# Patient Record
Sex: Female | Born: 1950
Health system: Southern US, Community
[De-identification: ages and names within clinical notes are randomized; demographics above are authoritative.]

## PROBLEM LIST (undated history)

## (undated) DIAGNOSIS — F329 Major depressive disorder, single episode, unspecified: Secondary | ICD-10-CM

## (undated) DIAGNOSIS — E119 Type 2 diabetes mellitus without complications: Secondary | ICD-10-CM

## (undated) DIAGNOSIS — I5041 Acute combined systolic (congestive) and diastolic (congestive) heart failure: Secondary | ICD-10-CM

## (undated) DIAGNOSIS — F32A Depression, unspecified: Secondary | ICD-10-CM

## (undated) DIAGNOSIS — J45909 Unspecified asthma, uncomplicated: Secondary | ICD-10-CM

## (undated) DIAGNOSIS — I10 Essential (primary) hypertension: Secondary | ICD-10-CM

## (undated) DIAGNOSIS — N289 Disorder of kidney and ureter, unspecified: Secondary | ICD-10-CM

## (undated) DIAGNOSIS — K219 Gastro-esophageal reflux disease without esophagitis: Secondary | ICD-10-CM

## (undated) DIAGNOSIS — M1990 Unspecified osteoarthritis, unspecified site: Secondary | ICD-10-CM

## (undated) HISTORY — PX: CHOLECYSTECTOMY: SHX55

## (undated) HISTORY — PX: COLONOSCOPY: SHX174

## (undated) HISTORY — PX: OTHER SURGICAL HISTORY: SHX169

## (undated) HISTORY — PX: EYE SURGERY: SHX253

---

## 2017-07-26 ENCOUNTER — Emergency Department (HOSPITAL_COMMUNITY)
Admission: EM | Admit: 2017-07-26 | Discharge: 2017-07-26 | Disposition: A | Discharge status: Home / Self Care | Payer: Medicare HMO | Attending: Emergency Medicine | Admitting: Emergency Medicine

## 2017-07-26 ENCOUNTER — Encounter (HOSPITAL_COMMUNITY): Payer: Self-pay | Admitting: Emergency Medicine

## 2017-07-26 DIAGNOSIS — N289 Disorder of kidney and ureter, unspecified: Secondary | ICD-10-CM | POA: Diagnosis not present

## 2017-07-26 DIAGNOSIS — R197 Diarrhea, unspecified: Secondary | ICD-10-CM | POA: Insufficient documentation

## 2017-07-26 HISTORY — DX: Type 2 diabetes mellitus without complications: E11.9

## 2017-07-26 HISTORY — DX: Disorder of kidney and ureter, unspecified: N28.9

## 2017-07-26 LAB — LIPASE, BLOOD: LIPASE: 21 U/L (ref 11–51)

## 2017-07-26 LAB — CBC
HEMATOCRIT: 34.1 % — AB (ref 36.0–46.0)
HEMOGLOBIN: 11 g/dL — AB (ref 12.0–15.0)
MCH: 27.7 pg (ref 26.0–34.0)
MCHC: 32.3 g/dL (ref 30.0–36.0)
MCV: 85.9 fL (ref 78.0–100.0)
PLATELETS: 297 10*3/uL (ref 150–400)
RBC: 3.97 MIL/uL (ref 3.87–5.11)
RDW: 15.5 % (ref 11.5–15.5)
WBC: 10 10*3/uL (ref 4.0–10.5)

## 2017-07-26 LAB — COMPREHENSIVE METABOLIC PANEL
ALBUMIN: 3.8 g/dL (ref 3.5–5.0)
ALT: 12 U/L — ABNORMAL LOW (ref 14–54)
ANION GAP: 11 (ref 5–15)
AST: 18 U/L (ref 15–41)
Alkaline Phosphatase: 92 U/L (ref 38–126)
BILIRUBIN TOTAL: 0.6 mg/dL (ref 0.3–1.2)
BUN: 40 mg/dL — ABNORMAL HIGH (ref 6–20)
CO2: 21 mmol/L — AB (ref 22–32)
Calcium: 8.9 mg/dL (ref 8.9–10.3)
Chloride: 109 mmol/L (ref 101–111)
Creatinine, Ser: 3.99 mg/dL — ABNORMAL HIGH (ref 0.44–1.00)
GFR calc Af Amer: 12 mL/min — ABNORMAL LOW (ref >60)
GFR calc non Af Amer: 11 mL/min — ABNORMAL LOW (ref >60)
GLUCOSE: 210 mg/dL — AB (ref 65–99)
POTASSIUM: 3.4 mmol/L — AB (ref 3.5–5.1)
SODIUM: 141 mmol/L (ref 135–145)
TOTAL PROTEIN: 7.8 g/dL (ref 6.5–8.1)

## 2017-07-26 LAB — URINALYSIS, ROUTINE W REFLEX MICROSCOPIC
Bilirubin Urine: NEGATIVE
Glucose, UA: NEGATIVE mg/dL
Hgb urine dipstick: NEGATIVE
KETONES UR: NEGATIVE mg/dL
Nitrite: NEGATIVE
PROTEIN: 100 mg/dL — AB
Specific Gravity, Urine: 1.015 (ref 1.005–1.030)
pH: 6 (ref 5.0–8.0)

## 2017-07-26 MED ORDER — SODIUM CHLORIDE 0.9 % IV BOLUS (SEPSIS)
1000.0000 mL | Freq: Once | INTRAVENOUS | Status: AC
Start: 2017-07-26 — End: 2017-07-26
  Administered 2017-07-26: 1000 mL via INTRAVENOUS

## 2017-07-26 NOTE — Discharge Instructions (Signed)
As discussed, please be sure to use or provided resources to follow-up with both your primary care physician and a nephrologist. Please drink plenty of fluids, get plenty of rest, and return here for concerning changes in your condition.

## 2017-07-26 NOTE — ED Provider Notes (Signed)
[facility] DEPT Provider Note   CSN: [id number] Arrival date & time: 07/26/17  0820     History   Chief Complaint Chief Complaint  Patient presents with  . Diarrhea    HPI Priscilla Davis is a 66 y.o. female.  HPI Patient presents with concern of ongoing diarrhea. Onset began about 4 days ago, since onset symptoms have been persistent, with between 5 and 10 loose bowel movements daily. There has been abdominal pain, but there is none currently. There is associated anorexia, nausea, but no vomiting. With no fever, no chills. No clear precipitant. Since onset no relief with Imodium. No other complaints including no chest pain, headache, syncope. Patient acknowledges a history of insulin-dependent diabetes, chronic kidney disease. Past Medical History:  Diagnosis Date  . Diabetes mellitus without complication ([place])   . Renal disorder     There are no active problems to display for this patient.   Past Surgical History:  Procedure Laterality Date  . CESAREAN SECTION    . CHOLECYSTECTOMY    . toe amputation      OB History    No data available       Home Medications    Prior to Admission medications   Medication Sig Start Date End Date Taking? Authorizing Provider  albuterol (PROVENTIL HFA;VENTOLIN HFA) 108 (90 Base) MCG/ACT inhaler Inhale 2 puffs into the lungs every 6 (six) hours as needed for wheezing or shortness of breath.   Yes [provider]  amLODipine (NORVASC) 10 MG tablet Take 10 mg by mouth daily.   Yes [provider]  aspirin EC 81 MG tablet Take 81 mg by mouth daily.   Yes [provider]  cholecalciferol (VITAMIN D) 1000 units tablet Take 1,000 Units by mouth daily.   Yes [provider]  dorzolamide (TRUSOPT) 2 % ophthalmic solution Place 1 drop into both eyes 2 (two) times daily.   Yes [provider]  FLUoxetine (PROZAC) 20 MG capsule Take 20 mg by mouth daily. 05/22/17   Yes [provider]  gabapentin (NEURONTIN) 300 MG capsule Take 300 mg by mouth 3 (three) times daily.   Yes [provider]  insulin detemir (LEVEMIR) 100 UNIT/ML injection Inject 52 Units into the skin 2 (two) times daily.   Yes [provider]  latanoprost (XALATAN) 0.005 % ophthalmic solution Place 1 drop into both eyes at bedtime.   Yes [provider]  liraglutide (VICTOZA) 18 MG/3ML SOPN Inject 2.8 mg into the skin every evening.   Yes [provider]  loperamide (IMODIUM A-D) 2 MG tablet Take 2-4 mg by mouth as needed for diarrhea or loose stools.   Yes [provider]  losartan (COZAAR) 25 MG tablet Take 25 mg by mouth daily.   Yes [provider]  metoprolol tartrate (LOPRESSOR) 25 MG tablet Take 25 mg by mouth 2 (two) times daily.   Yes [provider]  omeprazole (PRILOSEC) 20 MG capsule Take 20 mg by mouth daily.   Yes [provider]  rosuvastatin (CRESTOR) 20 MG tablet Take 20 mg by mouth at bedtime.   Yes [provider]  vitamin B-12 (CYANOCOBALAMIN) 1000 MCG tablet Take 1,000 mcg by mouth daily.   Yes [provider]    Family History No family history on file.  Social History Social History   Tobacco Use  . Smoking status: Current Every Day Smoker    Types: Cigarettes  . Smokeless tobacco: Never Used  Substance  Use Topics  . Alcohol use: Not on file  . Drug use: Not on file     Allergies   Patient has no known allergies.   Review of Systems Review of Systems  Constitutional:       Per HPI, otherwise negative  HENT:       Per HPI, otherwise negative  Respiratory:       Per HPI, otherwise negative  Cardiovascular:       Per HPI, otherwise negative  Gastrointestinal: Positive for diarrhea and nausea. Negative for abdominal pain and vomiting.  Endocrine:       Negative aside from HPI  Genitourinary:       Neg aside from HPI   Musculoskeletal:       Per  HPI, otherwise negative  Skin: Negative.   Neurological: Positive for weakness. Negative for syncope.     Physical Exam Updated Vital Signs BP (!) 161/79   Pulse 69   Temp 98.2 F (36.8 C) (Oral)   Resp 15   Ht 5\' 8"  (1.727 m)   Wt 118.8 kg (262 lb)   SpO2 99%   BMI 39.84 kg/m   Physical Exam  Constitutional: She is oriented to person, place, and time. She appears well-developed and well-nourished. No distress.  HENT:  Head: Normocephalic and atraumatic.  Eyes: Conjunctivae and EOM are normal.  Cardiovascular: Normal rate and regular rhythm.  Pulmonary/Chest: Effort normal and breath sounds normal. No stridor. No respiratory distress.  Abdominal: She exhibits no distension and no mass. There is no tenderness. There is no guarding.  Musculoskeletal: She exhibits no edema.  Neurological: She is alert and oriented to person, place, and time. No cranial nerve deficit.  Skin: Skin is warm and dry.  Psychiatric: She has a normal mood and affect.  Nursing note and vitals reviewed.    ED Treatments / Results  Labs (all labs ordered are listed, but only abnormal results are displayed) Labs Reviewed  COMPREHENSIVE METABOLIC PANEL - Abnormal; Notable for the following components:      Result Value   Potassium 3.4 (*)    CO2 21 (*)    Glucose, Bld 210 (*)    BUN 40 (*)    Creatinine, Ser 3.99 (*)    ALT 12 (*)    GFR calc non Af Amer 11 (*)    GFR calc Af Amer 12 (*)    All other components within normal limits  CBC - Abnormal; Notable for the following components:   Hemoglobin 11.0 (*)    HCT 34.1 (*)    All other components within normal limits  URINALYSIS, ROUTINE W REFLEX MICROSCOPIC - Abnormal; Notable for the following components:   APPearance HAZY (*)    Protein, ur 100 (*)    Leukocytes, UA TRACE (*)    Bacteria, UA MANY (*)    Squamous Epithelial / LPF 6-30 (*)    All other components within normal limits  LIPASE, BLOOD    Procedures Procedures (including  critical care time)  Medications Ordered in ED Medications  sodium chloride 0.9 % bolus 1,000 mL (0 mLs Intravenous Stopped 07/26/17 1049)     Initial Impression / Assessment and Plan / ED Course  I have reviewed the triage vital signs and the nursing notes.  Pertinent labs & imaging results that were available during my care of the patient were reviewed by me and considered in my medical decision making (see chart for details).  1:34 PM Patient awake alert, in  no distress, no additional episodes of diarrhea. I have discussed the findings with the patient and her sister at length. They note that she recently moved here from [place] has no primary care physician, nor a nephrologist. They also deny any knowledge of her chronic kidney disease status.  Patient remains him dynamically stable, awake, alert With a soft, nontender, non-peritoneal abdomen, no fever, no hemodynamic instability, imaging not indicated. Suspicion for dehydration contributing to her illness, likely exacerbating her chronic kidney disease. With generally reassuring findings, no initial loose bowel movement, the patient will follow up with nephrology and primary care. I discussed her case with our case management team to facilitate outpatient follow-up.  Final Clinical Impressions(s) / ED Diagnoses   Final diagnoses:  Diarrhea, unspecified type  Renal dysfunction     Carmin Muskrat, MD 07/26/17 1335

## 2017-07-26 NOTE — ED Triage Notes (Signed)
Patient reports that since Saturday she has had diarrhea, nausea and loss of appetite. Denies any vomiting.

## 2017-07-26 NOTE — ED Notes (Signed)
ED Provider at bedside. 

## 2017-07-26 NOTE — ED Notes (Signed)
Patient denies pain and is resting comfortably.  

## 2017-07-26 NOTE — ED Notes (Signed)
Pt unable to provide urine sample at this time 

## 2017-07-26 NOTE — Care Management Note (Signed)
Case Management Note  CM consulted for recent move to area with no pcp or nephrologist.  [facility] and [facility] information on AVS.  CM is unable to schedule appointments for pt due to offices closed for lunch.  Spoke with pt and advised her and her adult daughter of information.  Additionally advised her to look on the back of her insurance card for a phone number for assistance with a PCP, and also that there would a be a provider search number on the AVS.  Pt acknowledged understanding and had no further questions on needs.  No further CM needs noted at this time.

## 2018-02-07 ENCOUNTER — Other Ambulatory Visit: Payer: Self-pay | Admitting: Family Medicine

## 2018-02-07 DIAGNOSIS — Z1231 Encounter for screening mammogram for malignant neoplasm of breast: Secondary | ICD-10-CM

## 2018-02-09 ENCOUNTER — Other Ambulatory Visit: Payer: Self-pay | Admitting: Family Medicine

## 2018-02-09 DIAGNOSIS — R5381 Other malaise: Secondary | ICD-10-CM

## 2018-02-15 ENCOUNTER — Other Ambulatory Visit: Payer: Self-pay | Admitting: Family Medicine

## 2018-02-15 DIAGNOSIS — E2839 Other primary ovarian failure: Secondary | ICD-10-CM

## 2018-03-05 ENCOUNTER — Ambulatory Visit
Admission: RE | Admit: 2018-03-05 | Discharge: 2018-03-05 | Disposition: A | Discharge status: Home / Self Care | Payer: Medicare HMO | Source: Ambulatory Visit | Attending: Family Medicine | Admitting: Family Medicine

## 2018-03-05 DIAGNOSIS — Z1231 Encounter for screening mammogram for malignant neoplasm of breast: Secondary | ICD-10-CM

## 2018-03-30 ENCOUNTER — Ambulatory Visit
Admission: RE | Admit: 2018-03-30 | Discharge: 2018-03-30 | Disposition: A | Discharge status: Home / Self Care | Payer: Medicare HMO | Source: Ambulatory Visit | Attending: Family Medicine | Admitting: Family Medicine

## 2018-03-30 DIAGNOSIS — E2839 Other primary ovarian failure: Secondary | ICD-10-CM

## 2018-05-07 ENCOUNTER — Other Ambulatory Visit: Payer: Self-pay

## 2018-05-07 DIAGNOSIS — N185 Chronic kidney disease, stage 5: Secondary | ICD-10-CM

## 2018-05-31 ENCOUNTER — Encounter: Payer: Self-pay | Admitting: Vascular Surgery

## 2018-05-31 ENCOUNTER — Telehealth: Payer: Self-pay | Admitting: *Deleted

## 2018-05-31 ENCOUNTER — Ambulatory Visit (INDEPENDENT_AMBULATORY_CARE_PROVIDER_SITE_OTHER): Payer: Medicare HMO | Admitting: Vascular Surgery

## 2018-05-31 ENCOUNTER — Ambulatory Visit (HOSPITAL_COMMUNITY)
Admission: RE | Admit: 2018-05-31 | Discharge: 2018-05-31 | Disposition: A | Discharge status: Home / Self Care | Payer: Medicare HMO | Source: Ambulatory Visit | Attending: Vascular Surgery | Admitting: Vascular Surgery

## 2018-05-31 ENCOUNTER — Other Ambulatory Visit: Payer: Self-pay

## 2018-05-31 ENCOUNTER — Ambulatory Visit (INDEPENDENT_AMBULATORY_CARE_PROVIDER_SITE_OTHER)
Admission: RE | Admit: 2018-05-31 | Discharge: 2018-05-31 | Disposition: A | Discharge status: Home / Self Care | Payer: Medicare HMO | Source: Ambulatory Visit | Attending: Vascular Surgery | Admitting: Vascular Surgery

## 2018-05-31 VITALS — BP 144/77 | HR 68 | Temp 97.8°F | Resp 16 | Ht 68.0 in | Wt 225.0 lb

## 2018-05-31 DIAGNOSIS — Z01810 Encounter for preprocedural cardiovascular examination: Secondary | ICD-10-CM | POA: Diagnosis present

## 2018-05-31 DIAGNOSIS — N185 Chronic kidney disease, stage 5: Secondary | ICD-10-CM

## 2018-05-31 NOTE — H&P (View-Only) (Signed)
Referring Physician: Dr Johnney Ou Patient name: Priscilla Davis MRN: [id number] DOB: [birth year] Sex: female  REASON FOR CONSULT: Hemodialysis access  HPI: Priscilla Davis is a 67 y.o. female who is not currently on hemodialysis.  Notes from her referring physician suggested she is CKD 5.  She denies shortness of breath.  She denies chest pain.  She is right-handed.  She has not had any prior access procedures.  Other medical problems include diabetes hypertension and elevated cholesterol.  These are all currently stable.  She is on aspirin and a statin.  Past Medical History:  Diagnosis Date  . Diabetes mellitus without complication ([place])   . Renal disorder    Past Surgical History:  Procedure Laterality Date  . CESAREAN SECTION    . CHOLECYSTECTOMY    . toe amputation      Family History  Problem Relation Age of Onset  . Peripheral vascular disease Mother   . Hypertension Mother     SOCIAL HISTORY: Social History   Socioeconomic History  . Marital status: Widowed    Spouse name: Not on file  . Number of children: Not on file  . Years of education: Not on file  . Highest education level: Not on file  Occupational History  . Not on file  Social Needs  . Financial resource strain: Not on file  . Food insecurity:    Worry: Not on file    Inability: Not on file  . Transportation needs:    Medical: Not on file    Non-medical: Not on file  Tobacco Use  . Smoking status: Current Every Day Smoker    Types: Cigarettes  . Smokeless tobacco: Never Used  Substance and Sexual Activity  . Alcohol use: Yes    Comment: seldom  . Drug use: Never  . Sexual activity: Not on file  Lifestyle  . Physical activity:    Days per week: Not on file    Minutes per session: Not on file  . Stress: Not on file  Relationships  . Social connections:    Talks on phone: Not on file    Gets together: Not on file    Attends religious service: Not on file    Active member of club or organization:  Not on file    Attends meetings of clubs or organizations: Not on file    Relationship status: Not on file  . Intimate partner violence:    Fear of current or ex partner: Not on file    Emotionally abused: Not on file    Physically abused: Not on file    Forced sexual activity: Not on file  Other Topics Concern  . Not on file  Social History Narrative  . Not on file    No Known Allergies  Current Outpatient Medications  Medication Sig Dispense Refill  . albuterol (PROVENTIL HFA;VENTOLIN HFA) 108 (90 Base) MCG/ACT inhaler Inhale 2 puffs into the lungs every 6 (six) hours as needed for wheezing or shortness of breath.    Marland Kitchen amLODipine (NORVASC) 10 MG tablet Take 10 mg by mouth daily.    Marland Kitchen aspirin EC 81 MG tablet Take 81 mg by mouth daily.    . cholecalciferol (VITAMIN D) 1000 units tablet Take 1,000 Units by mouth daily.    . dorzolamide (TRUSOPT) 2 % ophthalmic solution Place 1 drop into both eyes 2 (two) times daily.    Marland Kitchen FLUoxetine (PROZAC) 20 MG capsule Take 20 mg by mouth daily.    Marland Kitchen  gabapentin (NEURONTIN) 300 MG capsule Take 300 mg by mouth 3 (three) times daily.    . insulin detemir (LEVEMIR) 100 UNIT/ML injection Inject 52 Units into the skin 2 (two) times daily.    Marland Kitchen latanoprost (XALATAN) 0.005 % ophthalmic solution Place 1 drop into both eyes at bedtime.    . liraglutide (VICTOZA) 18 MG/3ML SOPN Inject 2.8 mg into the skin every evening.    . loperamide (IMODIUM A-D) 2 MG tablet Take 2-4 mg by mouth as needed for diarrhea or loose stools.    Marland Kitchen losartan (COZAAR) 25 MG tablet Take 25 mg by mouth daily.    . metoprolol tartrate (LOPRESSOR) 25 MG tablet Take 25 mg by mouth 2 (two) times daily.    Marland Kitchen omeprazole (PRILOSEC) 20 MG capsule Take 20 mg by mouth daily.    . rosuvastatin (CRESTOR) 20 MG tablet Take 20 mg by mouth at bedtime.    . vitamin B-12 (CYANOCOBALAMIN) 1000 MCG tablet Take 1,000 mcg by mouth daily.     No current facility-administered medications for this visit.       ROS:   General:  No weight loss, Fever, chills  HEENT: No recent headaches, no nasal bleeding, no visual changes, no sore throat  Neurologic: No dizziness, blackouts, seizures. No recent symptoms of stroke or mini- stroke. No recent episodes of slurred speech, or temporary blindness.  Cardiac: No recent episodes of chest pain/pressure, no shortness of breath at rest.  + shortness of breath with exertion.  Denies history of atrial fibrillation or irregular heartbeat  Vascular: No history of rest pain in feet.  No history of claudication.  No history of non-healing ulcer, No history of DVT   Pulmonary: No home oxygen, no productive cough, no hemoptysis,  No asthma or wheezing  Musculoskeletal:  [ ]  Arthritis, [ ]  Low back pain,  [ ]  Joint pain  Hematologic:No history of hypercoagulable state.  No history of easy bleeding.  No history of anemia  Gastrointestinal: No hematochezia or melena,  No gastroesophageal reflux, no trouble swallowing  Urinary: [X]  chronic Kidney disease, [ ]  on HD - [ ]  MWF or [ ]  TTHS, [ ]  Burning with urination, [ ]  Frequent urination, [ ]  Difficulty urinating;   Skin: No rashes  Psychological: No history of anxiety,  No history of depression   Physical Examination  Vitals:   05/31/18 0913 05/31/18 0919  BP: (!) 166/82 (!) 144/77  Pulse: 68 68  Resp: 16   Temp: 97.8 F (36.6 C)   TempSrc: Oral   SpO2: 100%   Weight: 225 lb (102.1 kg)   Height: 5\' 8"  (1.727 m)     Body mass index is 34.21 kg/m.  General:  Alert and oriented, no acute distress HEENT: Normal Neck: No bruit or JVD Pulmonary: Clear to auscultation bilaterally Cardiac: Regular Rate and Rhythm without murmur Skin: No rash Extremity Pulses:  2+ radial, brachial pulses bilaterally Musculoskeletal: No deformity or edema  Neurologic: Upper and lower extremity motor 5/5 and symmetric  DATA:  She had an upper extremity vein mapping and upper extremity arterial duplex today.  This  showed small veins in both upper extremities less than 2 mm diameter she also had a very small radial artery less than 2 mm diameter.  He had a normal diameter brachial artery with normal anatomic configuration at the antecubital area  ASSESSMENT: Patient's first access option would be a left upper arm AV graft.  He is not a candidate for a fistula  due to very small vein diameter.   PLAN: Procedure details risk benefits possible complications discussed with patient today related to left upper arm AV graft.  These include but are not limited to bleeding infection ischemic steal graft thrombosis need for other procedures.  She wishes to proceed.  She will call us in the near future to schedule when she arranges transportation.  I will touch base with Dr. Johnney Ou to see whether or not she wishes for [place] to go ahead and place an AV graft now or wait until she is closer to time of needing hemodialysis so that we are not maintaining a graft that is not in use.  Ruta Hinds, MD [facility] Office: [phone] Pager: [phone]

## 2018-05-31 NOTE — Progress Notes (Signed)
Vitals:   05/31/18 0913  BP: (!) 166/82  Pulse: 68  Resp: 16  Temp: 97.8 F (36.6 C)  TempSrc: Oral  SpO2: 100%  Weight: 225 lb (102.1 kg)  Height: 5\' 8"  (1.727 m)

## 2018-05-31 NOTE — Telephone Encounter (Signed)
Message left for [facility] at [facility] (see Dr. Oneida Alar notes)

## 2018-05-31 NOTE — Progress Notes (Signed)
Referring Physician: Dr Johnney Ou Patient name: Priscilla Davis MRN: [id number] DOB: 1950/12/21 Sex: female  REASON FOR CONSULT: Hemodialysis access  HPI: Priscilla Davis is a 67 y.o. female who is not currently on hemodialysis.  Notes from her referring physician suggested she is CKD 5.  She denies shortness of breath.  She denies chest pain.  She is right-handed.  She has not had any prior access procedures.  Other medical problems include diabetes hypertension and elevated cholesterol.  These are all currently stable.  She is on aspirin and a statin.  Past Medical History:  Diagnosis Date  . Diabetes mellitus without complication ([place])   . Renal disorder    Past Surgical History:  Procedure Laterality Date  . CESAREAN SECTION    . CHOLECYSTECTOMY    . toe amputation      Family History  Problem Relation Age of Onset  . Peripheral vascular disease Mother   . Hypertension Mother     SOCIAL HISTORY: Social History   Socioeconomic History  . Marital status: Widowed    Spouse name: Not on file  . Number of children: Not on file  . Years of education: Not on file  . Highest education level: Not on file  Occupational History  . Not on file  Social Needs  . Financial resource strain: Not on file  . Food insecurity:    Worry: Not on file    Inability: Not on file  . Transportation needs:    Medical: Not on file    Non-medical: Not on file  Tobacco Use  . Smoking status: Current Every Day Smoker    Types: Cigarettes  . Smokeless tobacco: Never Used  Substance and Sexual Activity  . Alcohol use: Yes    Comment: seldom  . Drug use: Never  . Sexual activity: Not on file  Lifestyle  . Physical activity:    Days per week: Not on file    Minutes per session: Not on file  . Stress: Not on file  Relationships  . Social connections:    Talks on phone: Not on file    Gets together: Not on file    Attends religious service: Not on file    Active member of club or organization:  Not on file    Attends meetings of clubs or organizations: Not on file    Relationship status: Not on file  . Intimate partner violence:    Fear of current or ex partner: Not on file    Emotionally abused: Not on file    Physically abused: Not on file    Forced sexual activity: Not on file  Other Topics Concern  . Not on file  Social History Narrative  . Not on file    No Known Allergies  Current Outpatient Medications  Medication Sig Dispense Refill  . albuterol (PROVENTIL HFA;VENTOLIN HFA) 108 (90 Base) MCG/ACT inhaler Inhale 2 puffs into the lungs every 6 (six) hours as needed for wheezing or shortness of breath.    Marland Kitchen amLODipine (NORVASC) 10 MG tablet Take 10 mg by mouth daily.    Marland Kitchen aspirin EC 81 MG tablet Take 81 mg by mouth daily.    . cholecalciferol (VITAMIN D) 1000 units tablet Take 1,000 Units by mouth daily.    . dorzolamide (TRUSOPT) 2 % ophthalmic solution Place 1 drop into both eyes 2 (two) times daily.    Marland Kitchen FLUoxetine (PROZAC) 20 MG capsule Take 20 mg by mouth daily.    Marland Kitchen  gabapentin (NEURONTIN) 300 MG capsule Take 300 mg by mouth 3 (three) times daily.    . insulin detemir (LEVEMIR) 100 UNIT/ML injection Inject 52 Units into the skin 2 (two) times daily.    Marland Kitchen latanoprost (XALATAN) 0.005 % ophthalmic solution Place 1 drop into both eyes at bedtime.    . liraglutide (VICTOZA) 18 MG/3ML SOPN Inject 2.8 mg into the skin every evening.    . loperamide (IMODIUM A-D) 2 MG tablet Take 2-4 mg by mouth as needed for diarrhea or loose stools.    Marland Kitchen losartan (COZAAR) 25 MG tablet Take 25 mg by mouth daily.    . metoprolol tartrate (LOPRESSOR) 25 MG tablet Take 25 mg by mouth 2 (two) times daily.    Marland Kitchen omeprazole (PRILOSEC) 20 MG capsule Take 20 mg by mouth daily.    . rosuvastatin (CRESTOR) 20 MG tablet Take 20 mg by mouth at bedtime.    . vitamin B-12 (CYANOCOBALAMIN) 1000 MCG tablet Take 1,000 mcg by mouth daily.     No current facility-administered medications for this visit.       ROS:   General:  No weight loss, Fever, chills  HEENT: No recent headaches, no nasal bleeding, no visual changes, no sore throat  Neurologic: No dizziness, blackouts, seizures. No recent symptoms of stroke or mini- stroke. No recent episodes of slurred speech, or temporary blindness.  Cardiac: No recent episodes of chest pain/pressure, no shortness of breath at rest.  + shortness of breath with exertion.  Denies history of atrial fibrillation or irregular heartbeat  Vascular: No history of rest pain in feet.  No history of claudication.  No history of non-healing ulcer, No history of DVT   Pulmonary: No home oxygen, no productive cough, no hemoptysis,  No asthma or wheezing  Musculoskeletal:  [ ]  Arthritis, [ ]  Low back pain,  [ ]  Joint pain  Hematologic:No history of hypercoagulable state.  No history of easy bleeding.  No history of anemia  Gastrointestinal: No hematochezia or melena,  No gastroesophageal reflux, no trouble swallowing  Urinary: [X]  chronic Kidney disease, [ ]  on HD - [ ]  MWF or [ ]  TTHS, [ ]  Burning with urination, [ ]  Frequent urination, [ ]  Difficulty urinating;   Skin: No rashes  Psychological: No history of anxiety,  No history of depression   Physical Examination  Vitals:   05/31/18 0913 05/31/18 0919  BP: (!) 166/82 (!) 144/77  Pulse: 68 68  Resp: 16   Temp: 97.8 F (36.6 C)   TempSrc: Oral   SpO2: 100%   Weight: 225 lb (102.1 kg)   Height: 5\' 8"  (1.727 m)     Body mass index is 34.21 kg/m.  General:  Alert and oriented, no acute distress HEENT: Normal Neck: No bruit or JVD Pulmonary: Clear to auscultation bilaterally Cardiac: Regular Rate and Rhythm without murmur Skin: No rash Extremity Pulses:  2+ radial, brachial pulses bilaterally Musculoskeletal: No deformity or edema  Neurologic: Upper and lower extremity motor 5/5 and symmetric  DATA:  She had an upper extremity vein mapping and upper extremity arterial duplex today.  This  showed small veins in both upper extremities less than 2 mm diameter she also had a very small radial artery less than 2 mm diameter.  He had a normal diameter brachial artery with normal anatomic configuration at the antecubital area  ASSESSMENT: Patient's first access option would be a left upper arm AV graft.  He is not a candidate for a fistula  due to very small vein diameter.   PLAN: Procedure details risk benefits possible complications discussed with patient today related to left upper arm AV graft.  These include but are not limited to bleeding infection ischemic steal graft thrombosis need for other procedures.  She wishes to proceed.  She will call us in the near future to schedule when she arranges transportation.  I will touch base with Dr. Johnney Ou to see whether or not she wishes for [place] to go ahead and place an AV graft now or wait until she is closer to time of needing hemodialysis so that we are not maintaining a graft that is not in use.  Ruta Hinds, MD [facility] Office: [phone] Pager: [phone]

## 2018-06-04 ENCOUNTER — Encounter: Payer: Self-pay | Admitting: Internal Medicine

## 2018-06-05 ENCOUNTER — Telehealth: Payer: Self-pay | Admitting: *Deleted

## 2018-06-05 ENCOUNTER — Other Ambulatory Visit: Payer: Self-pay | Admitting: *Deleted

## 2018-06-05 NOTE — Progress Notes (Signed)
LEFT MESSAGE FOR PATIENT TO CALL BACK FOR PRE-OP INSTRUCTIONS. SURGERY SCHEDULED FOR 06/18/18.

## 2018-06-05 NOTE — Telephone Encounter (Signed)
Instructed to be at [facility] admitting department at 8:15 am on 06/18/18 for surgery. NPO past MN night prior and must have a driver and caregiver for discharge to home. Expect a call and follow the detailed instructions received from the hospital pre-admission department for insulin coverage, medications and instructions for this surgery. Verbalized understanding and to call this office for questions.

## 2018-06-07 ENCOUNTER — Telehealth: Payer: Self-pay | Admitting: *Deleted

## 2018-06-07 NOTE — Telephone Encounter (Signed)
Patient called and requested date change for surgery to 06/19/18 Instructed to be at hospital at 7:30 am all other pre-op instructions unchanged.

## 2018-06-10 ENCOUNTER — Emergency Department (HOSPITAL_COMMUNITY)
Admission: EM | Admit: 2018-06-10 | Discharge: 2018-06-10 | Disposition: A | Discharge status: Home / Self Care | Payer: Medicare HMO | Attending: Emergency Medicine | Admitting: Emergency Medicine

## 2018-06-10 ENCOUNTER — Other Ambulatory Visit: Payer: Self-pay

## 2018-06-10 ENCOUNTER — Emergency Department (HOSPITAL_COMMUNITY): Payer: Medicare HMO

## 2018-06-10 ENCOUNTER — Encounter (HOSPITAL_COMMUNITY): Payer: Self-pay | Admitting: *Deleted

## 2018-06-10 DIAGNOSIS — R634 Abnormal weight loss: Secondary | ICD-10-CM | POA: Insufficient documentation

## 2018-06-10 DIAGNOSIS — R1310 Dysphagia, unspecified: Secondary | ICD-10-CM | POA: Diagnosis not present

## 2018-06-10 DIAGNOSIS — R197 Diarrhea, unspecified: Secondary | ICD-10-CM | POA: Insufficient documentation

## 2018-06-10 DIAGNOSIS — R1030 Lower abdominal pain, unspecified: Secondary | ICD-10-CM | POA: Diagnosis present

## 2018-06-10 DIAGNOSIS — Z794 Long term (current) use of insulin: Secondary | ICD-10-CM | POA: Diagnosis not present

## 2018-06-10 DIAGNOSIS — I10 Essential (primary) hypertension: Secondary | ICD-10-CM | POA: Insufficient documentation

## 2018-06-10 DIAGNOSIS — E119 Type 2 diabetes mellitus without complications: Secondary | ICD-10-CM | POA: Diagnosis not present

## 2018-06-10 HISTORY — DX: Essential (primary) hypertension: I10

## 2018-06-10 LAB — CBC
HEMATOCRIT: 33.9 % — AB (ref 36.0–46.0)
HEMOGLOBIN: 10.3 g/dL — AB (ref 12.0–15.0)
MCH: 27.8 pg (ref 26.0–34.0)
MCHC: 30.4 g/dL (ref 30.0–36.0)
MCV: 91.4 fL (ref 80.0–100.0)
Platelets: 315 10*3/uL (ref 150–400)
RBC: 3.71 MIL/uL — ABNORMAL LOW (ref 3.87–5.11)
RDW: 14.7 % (ref 11.5–15.5)
WBC: 8.5 10*3/uL (ref 4.0–10.5)
nRBC: 0 % (ref 0.0–0.2)

## 2018-06-10 LAB — COMPREHENSIVE METABOLIC PANEL
ALBUMIN: 3.2 g/dL — AB (ref 3.5–5.0)
ALT: 12 U/L (ref 0–44)
AST: 17 U/L (ref 15–41)
Alkaline Phosphatase: 74 U/L (ref 38–126)
Anion gap: 10 (ref 5–15)
BUN: 28 mg/dL — AB (ref 8–23)
CHLORIDE: 112 mmol/L — AB (ref 98–111)
CO2: 19 mmol/L — ABNORMAL LOW (ref 22–32)
Calcium: 8.8 mg/dL — ABNORMAL LOW (ref 8.9–10.3)
Creatinine, Ser: 3.79 mg/dL — ABNORMAL HIGH (ref 0.44–1.00)
GFR calc Af Amer: 13 mL/min — ABNORMAL LOW (ref >60)
GFR calc non Af Amer: 11 mL/min — ABNORMAL LOW (ref >60)
GLUCOSE: 104 mg/dL — AB (ref 70–99)
POTASSIUM: 3.8 mmol/L (ref 3.5–5.1)
Sodium: 141 mmol/L (ref 135–145)
Total Bilirubin: 0.6 mg/dL (ref 0.3–1.2)
Total Protein: 6.9 g/dL (ref 6.5–8.1)

## 2018-06-10 LAB — LIPASE, BLOOD: LIPASE: 28 U/L (ref 11–51)

## 2018-06-10 MED ORDER — MORPHINE SULFATE (PF) 4 MG/ML IV SOLN
4.0000 mg | Freq: Once | INTRAVENOUS | Status: AC
  Administered 2018-06-10: 4 mg via INTRAVENOUS
  Filled 2018-06-10: qty 1

## 2018-06-10 MED ORDER — SODIUM CHLORIDE 0.9 % IV BOLUS
1000.0000 mL | Freq: Once | INTRAVENOUS | Status: AC
  Administered 2018-06-10: 1000 mL via INTRAVENOUS

## 2018-06-10 NOTE — ED Notes (Signed)
Patient transported to CT 

## 2018-06-10 NOTE — ED Triage Notes (Signed)
Pt reports episodes of lower abd pain with diarrhea x 2 months. Reports possible fever this am. Denies n/v but feels like her food gets stuck in her throat and does not digest. No acute distress is noted at this time.

## 2018-06-10 NOTE — ED Provider Notes (Signed)
[facility] EMERGENCY DEPARTMENT Provider Note   CSN: [id number] Arrival date & time: 06/10/18  1203     History   Chief Complaint Chief Complaint  Patient presents with  . Abdominal Pain  . Diarrhea    HPI Priscilla Davis is a 67 y.o. female.  HPI Patient is a 67 year old female presents the emergency department with approximately 2 months of diarrhea and some lower abdominal discomfort.  The diarrhea occurs once to twice a week.  Followed by loose stools.  She has not had a formed stool in some time.  Her last colonoscopy was greater than 10 years ago.  She also reports some 30 to 40 pound weight loss over the past several months.  She states her appetite is been normal.  She denies chest pain.  She denies focal abdominal pain at this time.  She also reports some feeling of dysphasia with certain foods.  She is able to drink water.  She has no other complaints at this time.  Symptoms are mild to moderate in severity.  She spoke with her primary care physician regarding the dysphagia one time was put on Zantac which she reports is not helping. Past Medical History:  Diagnosis Date  . Diabetes mellitus without complication ([place])   . Hypertension   . Renal disorder     There are no active problems to display for this patient.   Past Surgical History:  Procedure Laterality Date  . CESAREAN SECTION    . CHOLECYSTECTOMY    . toe amputation       OB History   None      Home Medications    Prior to Admission medications   Medication Sig Start Date End Date Taking? Authorizing Provider  albuterol (PROVENTIL HFA;VENTOLIN HFA) 108 (90 Base) MCG/ACT inhaler Inhale 2 puffs into the lungs every 6 (six) hours as needed for wheezing or shortness of breath.   Yes [provider]  amLODipine (NORVASC) 10 MG tablet Take 10 mg by mouth daily.   Yes [provider]  aspirin EC 81 MG tablet Take 81 mg by mouth daily.   Yes [provider]    cholecalciferol (VITAMIN D) 1000 units tablet Take 1,000 Units by mouth daily.   Yes [provider]  dorzolamide (TRUSOPT) 2 % ophthalmic solution Place 1 drop into both eyes 2 (two) times daily.   Yes [provider]  FLUoxetine (PROZAC) 20 MG capsule Take 20 mg by mouth daily. 05/22/17  Yes [provider]  gabapentin (NEURONTIN) 300 MG capsule Take 300 mg by mouth 3 (three) times daily.   Yes [provider]  insulin detemir (LEVEMIR) 100 UNIT/ML injection Inject 48 Units into the skin 2 (two) times daily.    Yes [provider]  latanoprost (XALATAN) 0.005 % ophthalmic solution Place 1 drop into both eyes at bedtime.   Yes [provider]  liraglutide (VICTOZA) 18 MG/3ML SOPN Inject 2.8 mg into the skin every evening.   Yes [provider]  losartan (COZAAR) 25 MG tablet Take 25 mg by mouth daily.   Yes [provider]  metoprolol tartrate (LOPRESSOR) 25 MG tablet Take 25 mg by mouth 2 (two) times daily.   Yes [provider]  omeprazole (PRILOSEC) 20 MG capsule Take 20 mg by mouth daily.   Yes [provider]  rosuvastatin (CRESTOR) 20 MG tablet Take 20 mg by mouth at bedtime.   Yes [provider]  vitamin B-12 (CYANOCOBALAMIN)  1000 MCG tablet Take 1,000 mcg by mouth daily.   Yes [provider]    Family History Family History  Problem Relation Age of Onset  . Peripheral vascular disease Mother   . Hypertension Mother     Social History Social History   Tobacco Use  . Smoking status: Current Every Day Smoker    Types: Cigarettes  . Smokeless tobacco: Never Used  Substance Use Topics  . Alcohol use: Yes    Comment: seldom  . Drug use: Never     Allergies   Patient has no known allergies.   Review of Systems Review of Systems  All other systems reviewed and are negative.    Physical Exam Updated Vital Signs BP (!) 166/78   Pulse 75   Temp 98.9 F (37.2  C) (Oral)   Resp 17   Ht 5\' 6"  (1.676 m)   Wt 102.1 kg   SpO2 100%   BMI 36.32 kg/m   Physical Exam  Constitutional: She is oriented to person, place, and time. She appears well-developed and well-nourished. No distress.  HENT:  Head: Normocephalic and atraumatic.  Eyes: EOM are normal.  Neck: Normal range of motion.  Cardiovascular: Normal rate, regular rhythm and normal heart sounds.  Pulmonary/Chest: Effort normal and breath sounds normal.  Abdominal: Soft. She exhibits no distension. There is no tenderness.  Musculoskeletal: Normal range of motion.  Neurological: She is alert and oriented to person, place, and time.  Skin: Skin is warm and dry.  Psychiatric: She has a normal mood and affect. Judgment normal.  Nursing note and vitals reviewed.    ED Treatments / Results  Labs (all labs ordered are listed, but only abnormal results are displayed) Labs Reviewed  COMPREHENSIVE METABOLIC PANEL - Abnormal; Notable for the following components:      Result Value   Chloride 112 (*)    CO2 19 (*)    Glucose, Bld 104 (*)    BUN 28 (*)    Creatinine, Ser 3.79 (*)    Calcium 8.8 (*)    Albumin 3.2 (*)    GFR calc non Af Amer 11 (*)    GFR calc Af Amer 13 (*)    All other components within normal limits  CBC - Abnormal; Notable for the following components:   RBC 3.71 (*)    Hemoglobin 10.3 (*)    HCT 33.9 (*)    All other components within normal limits  LIPASE, BLOOD  URINALYSIS, ROUTINE W REFLEX MICROSCOPIC    EKG None  Radiology Ct Abdomen Pelvis Wo Contrast  Result Date: 06/10/2018 CLINICAL DATA:  Lower abdominal pain for 2 months with diarrhea EXAM: CT ABDOMEN AND PELVIS WITHOUT CONTRAST TECHNIQUE: Multidetector CT imaging of the abdomen and pelvis was performed following the standard protocol without IV contrast. COMPARISON:  None. FINDINGS: Lower chest: No acute abnormality. Hepatobiliary: No focal liver abnormality is seen. Status post cholecystectomy. No  biliary dilatation. Pancreas: Unremarkable. No pancreatic ductal dilatation or surrounding inflammatory changes. Spleen: Normal in size without focal abnormality. Adrenals/Urinary Tract: Adrenal glands are within normal limits. Kidneys are well visualized with tiny nonobstructing renal stones bilaterally. Extrarenal pelves are noted bilaterally as well as renal vascular calcifications. No definitive ureteral stones are seen. The bladder is partially distended. Stomach/Bowel: The appendix is well visualized and within normal limits. No obstructive or inflammatory changes of the bowel are seen. Vascular/Lymphatic: Aortic atherosclerosis. No enlarged abdominal or pelvic lymph nodes. Reproductive: Uterus and bilateral adnexa are unremarkable.  Other: No abdominal wall hernia or abnormality. No abdominopelvic ascites. Musculoskeletal: Degenerative changes of lumbar spine and sacroiliac joints are noted. Soft tissue changes are noted in the anterior abdominal wall likely related to injections given its symmetrical nature. IMPRESSION: Bilateral nonobstructing renal stones. No acute abnormality is identified correspond with patient's given clinical history. Electronically Signed   By: Inez Catalina M.D.   On: 06/10/2018 14:56   Dg Abd Acute W/chest  Result Date: 06/10/2018 CLINICAL DATA:  Intermittent lower abdominal pain.  Diarrhea. EXAM: DG ABDOMEN ACUTE W/ 1V CHEST COMPARISON:  None. FINDINGS: Enlarged cardiac and mediastinal contours. No consolidative pulmonary opacities. No pleural effusion or pneumothorax. Large amount of stool throughout the colon. Gas is demonstrated within nondilated loops of large and small bowel in a nonobstructed pattern. Lumbar spine and thoracic spine degenerative changes. Pelvic phleboliths. SI joints unremarkable. IMPRESSION: No acute cardiopulmonary process.  Cardiomegaly. Stool throughout the colon as can be seen with constipation. Nonobstructed bowel gas pattern. Electronically Signed    By: Lovey Newcomer M.D.   On: 06/10/2018 14:23    Procedures Procedures (including critical care time)  Medications Ordered in ED Medications  sodium chloride 0.9 % bolus 1,000 mL (1,000 mLs Intravenous New Bag/Given 06/10/18 1409)  morphine 4 MG/ML injection 4 mg (4 mg Intravenous Given 06/10/18 1415)     Initial Impression / Assessment and Plan / ED Course  I have reviewed the triage vital signs and the nursing notes.  Pertinent labs & imaging results that were available during my care of the patient were reviewed by me and considered in my medical decision making (see chart for details).     Patient will need follow-up with her primary care physician.  She will also need follow-up with a equal gastroenterology as she will likely benefit from endoscopy and colonoscopy.  Overall well-appearing.  Vital signs are stable.  Hydrated in the emergency department.  Discharged home in good condition.  Primary care follow-up.  Patient encouraged to return to the emergency department for new or worsening symptoms  Final Clinical Impressions(s) / ED Diagnoses   Final diagnoses:  None    ED Discharge Orders    None       Jola Schmidt, MD 06/10/18 1521

## 2018-06-18 ENCOUNTER — Encounter (HOSPITAL_COMMUNITY): Payer: Self-pay | Admitting: *Deleted

## 2018-06-18 NOTE — Progress Notes (Signed)
Priscilla Davis denies chest pain or shortness of breath. Patient does not see a cardiologist. PCP is Dr Jillyn Ledger.  Patient is followed by [facility].  Priscilla Davis reports that CBG's run 100-110, but has had drops approximately 1 time a week to the 50's. I instructed patient to check CBG after awaking and every 2 hours until arrival  to the hospital.  I Instructed patient if CBG is less than 70 to take 4 Glucose Tablets. Recheck CBG in 15 minutes then call pre- op desk at [phone] for further instructions. If scheduled to receive Insulin, do not take Insulin

## 2018-06-19 ENCOUNTER — Ambulatory Visit (HOSPITAL_COMMUNITY)
Admission: RE | Admit: 2018-06-19 | Discharge: 2018-06-19 | Disposition: A | Discharge status: Home / Self Care | Payer: Medicare HMO | Source: Ambulatory Visit | Attending: Vascular Surgery | Admitting: Vascular Surgery

## 2018-06-19 ENCOUNTER — Encounter (HOSPITAL_COMMUNITY): Payer: Self-pay

## 2018-06-19 ENCOUNTER — Other Ambulatory Visit: Payer: Self-pay

## 2018-06-19 ENCOUNTER — Ambulatory Visit (HOSPITAL_COMMUNITY): Payer: Medicare HMO | Admitting: Certified Registered Nurse Anesthetist

## 2018-06-19 ENCOUNTER — Encounter (HOSPITAL_COMMUNITY)
Admission: RE | Disposition: A | Discharge status: Home / Self Care | Payer: Self-pay | Source: Ambulatory Visit | Attending: Vascular Surgery

## 2018-06-19 DIAGNOSIS — K219 Gastro-esophageal reflux disease without esophagitis: Secondary | ICD-10-CM | POA: Insufficient documentation

## 2018-06-19 DIAGNOSIS — N185 Chronic kidney disease, stage 5: Secondary | ICD-10-CM

## 2018-06-19 DIAGNOSIS — Z79899 Other long term (current) drug therapy: Secondary | ICD-10-CM | POA: Insufficient documentation

## 2018-06-19 DIAGNOSIS — I120 Hypertensive chronic kidney disease with stage 5 chronic kidney disease or end stage renal disease: Secondary | ICD-10-CM | POA: Insufficient documentation

## 2018-06-19 DIAGNOSIS — J45909 Unspecified asthma, uncomplicated: Secondary | ICD-10-CM | POA: Insufficient documentation

## 2018-06-19 DIAGNOSIS — Z7982 Long term (current) use of aspirin: Secondary | ICD-10-CM | POA: Insufficient documentation

## 2018-06-19 DIAGNOSIS — E1122 Type 2 diabetes mellitus with diabetic chronic kidney disease: Secondary | ICD-10-CM | POA: Diagnosis not present

## 2018-06-19 DIAGNOSIS — Z794 Long term (current) use of insulin: Secondary | ICD-10-CM | POA: Insufficient documentation

## 2018-06-19 DIAGNOSIS — F329 Major depressive disorder, single episode, unspecified: Secondary | ICD-10-CM | POA: Insufficient documentation

## 2018-06-19 DIAGNOSIS — F17210 Nicotine dependence, cigarettes, uncomplicated: Secondary | ICD-10-CM | POA: Diagnosis not present

## 2018-06-19 HISTORY — PX: AV FISTULA PLACEMENT: SHX1204

## 2018-06-19 HISTORY — DX: Unspecified asthma, uncomplicated: J45.909

## 2018-06-19 HISTORY — DX: Gastro-esophageal reflux disease without esophagitis: K21.9

## 2018-06-19 HISTORY — DX: Unspecified osteoarthritis, unspecified site: M19.90

## 2018-06-19 HISTORY — DX: Major depressive disorder, single episode, unspecified: F32.9

## 2018-06-19 HISTORY — DX: Depression, unspecified: F32.A

## 2018-06-19 LAB — GLUCOSE, CAPILLARY
GLUCOSE-CAPILLARY: 31 mg/dL — AB (ref 70–99)
Glucose-Capillary: 117 mg/dL — ABNORMAL HIGH (ref 70–99)

## 2018-06-19 LAB — POCT I-STAT 4, (NA,K, GLUC, HGB,HCT)
Glucose, Bld: 77 mg/dL (ref 70–99)
HCT: 30 % — ABNORMAL LOW (ref 36.0–46.0)
Hemoglobin: 10.2 g/dL — ABNORMAL LOW (ref 12.0–15.0)
Potassium: 3.3 mmol/L — ABNORMAL LOW (ref 3.5–5.1)
Sodium: 144 mmol/L (ref 135–145)

## 2018-06-19 SURGERY — INSERTION OF ARTERIOVENOUS (AV) GORE-TEX GRAFT ARM
Anesthesia: Monitor Anesthesia Care | Site: Arm Lower | Laterality: Left

## 2018-06-19 MED ORDER — LIDOCAINE HCL (PF) 1 % IJ SOLN
INTRAMUSCULAR | Status: AC
  Filled 2018-06-19: qty 30

## 2018-06-19 MED ORDER — ONDANSETRON HCL 4 MG/2ML IJ SOLN
INTRAMUSCULAR | Status: DC | PRN
  Administered 2018-06-19: 4 mg via INTRAVENOUS

## 2018-06-19 MED ORDER — PHENYLEPHRINE 40 MCG/ML (10ML) SYRINGE FOR IV PUSH (FOR BLOOD PRESSURE SUPPORT)
PREFILLED_SYRINGE | INTRAVENOUS | Status: DC | PRN
  Administered 2018-06-19: 80 ug via INTRAVENOUS
  Administered 2018-06-19: 120 ug via INTRAVENOUS
  Administered 2018-06-19: 80 ug via INTRAVENOUS

## 2018-06-19 MED ORDER — EPHEDRINE SULFATE-NACL 50-0.9 MG/10ML-% IV SOSY
PREFILLED_SYRINGE | INTRAVENOUS | Status: DC | PRN
  Administered 2018-06-19: 10 mg via INTRAVENOUS
  Administered 2018-06-19: 15 mg via INTRAVENOUS
  Administered 2018-06-19: 10 mg via INTRAVENOUS

## 2018-06-19 MED ORDER — PROPOFOL 500 MG/50ML IV EMUL
INTRAVENOUS | Status: DC | PRN
  Administered 2018-06-19: 75 ug/kg/min via INTRAVENOUS

## 2018-06-19 MED ORDER — CHLORHEXIDINE GLUCONATE 4 % EX LIQD: 60.0000 mL | Freq: Once | CUTANEOUS | Status: DC

## 2018-06-19 MED ORDER — SODIUM CHLORIDE 0.9 % IV SOLN
INTRAVENOUS | Status: AC
  Filled 2018-06-19: qty 1.2

## 2018-06-19 MED ORDER — DEXTROSE 50 % IV SOLN
1.0000 | Freq: Once | INTRAVENOUS | Status: AC
  Administered 2018-06-19: 50 mL via INTRAVENOUS

## 2018-06-19 MED ORDER — SODIUM CHLORIDE 0.9 % IV SOLN
INTRAVENOUS | Status: DC
  Administered 2018-06-19 (×2): via INTRAVENOUS

## 2018-06-19 MED ORDER — CEFAZOLIN SODIUM-DEXTROSE 2-4 GM/100ML-% IV SOLN
INTRAVENOUS | Status: AC
  Filled 2018-06-19: qty 100

## 2018-06-19 MED ORDER — CEFAZOLIN SODIUM-DEXTROSE 2-4 GM/100ML-% IV SOLN
2.0000 g | INTRAVENOUS | Status: AC
  Administered 2018-06-19: 2 g via INTRAVENOUS

## 2018-06-19 MED ORDER — HEMOSTATIC AGENTS (NO CHARGE) OPTIME
TOPICAL | Status: DC | PRN
  Administered 2018-06-19: 1 via TOPICAL

## 2018-06-19 MED ORDER — FENTANYL CITRATE (PF) 100 MCG/2ML IJ SOLN: 25.0000 ug | INTRAMUSCULAR | Status: DC | PRN

## 2018-06-19 MED ORDER — PROTAMINE SULFATE 10 MG/ML IV SOLN
INTRAVENOUS | Status: DC | PRN
  Administered 2018-06-19 (×5): 10 mg via INTRAVENOUS

## 2018-06-19 MED ORDER — 0.9 % SODIUM CHLORIDE (POUR BTL) OPTIME
TOPICAL | Status: DC | PRN
  Administered 2018-06-19: 1000 mL

## 2018-06-19 MED ORDER — LIDOCAINE HCL (PF) 1 % IJ SOLN
INTRAMUSCULAR | Status: DC | PRN
  Administered 2018-06-19: 30 mL

## 2018-06-19 MED ORDER — OXYCODONE HCL 5 MG PO TABS: 5.0000 mg | ORAL_TABLET | Freq: Four times a day (QID) | ORAL | 0 refills | Status: DC | PRN

## 2018-06-19 MED ORDER — DEXTROSE 50 % IV SOLN
INTRAVENOUS | Status: AC
  Filled 2018-06-19: qty 50

## 2018-06-19 MED ORDER — OXYCODONE HCL 5 MG/5ML PO SOLN: 5.0000 mg | Freq: Once | ORAL | Status: DC | PRN

## 2018-06-19 MED ORDER — HEPARIN SODIUM (PORCINE) 1000 UNIT/ML IJ SOLN
INTRAMUSCULAR | Status: DC | PRN
  Administered 2018-06-19: 5000 [IU] via INTRAVENOUS

## 2018-06-19 MED ORDER — OXYCODONE HCL 5 MG PO TABS: 5.0000 mg | ORAL_TABLET | Freq: Once | ORAL | Status: DC | PRN

## 2018-06-19 MED ORDER — SODIUM CHLORIDE 0.9 % IV SOLN
INTRAVENOUS | Status: DC | PRN
  Administered 2018-06-19: 11:00:00

## 2018-06-19 SURGICAL SUPPLY — 31 items
ARMBAND PINK RESTRICT EXTREMIT (MISCELLANEOUS) ×4 IMPLANT
CANISTER SUCT 3000ML PPV (MISCELLANEOUS) ×2 IMPLANT
CANNULA VESSEL 3MM 2 BLNT TIP (CANNULA) ×2 IMPLANT
CLIP VESOCCLUDE MED 6/CT (CLIP) ×2 IMPLANT
CLIP VESOCCLUDE SM WIDE 6/CT (CLIP) ×2 IMPLANT
COVER WAND RF STERILE (DRAPES) ×2 IMPLANT
DECANTER SPIKE VIAL GLASS SM (MISCELLANEOUS) ×2 IMPLANT
DERMABOND ADVANCED (GAUZE/BANDAGES/DRESSINGS) ×1
DERMABOND ADVANCED .7 DNX12 (GAUZE/BANDAGES/DRESSINGS) ×1 IMPLANT
ELECT REM PT RETURN 9FT ADLT (ELECTROSURGICAL) ×2
ELECTRODE REM PT RTRN 9FT ADLT (ELECTROSURGICAL) ×1 IMPLANT
GLOVE BIO SURGEON STRL SZ 6.5 (GLOVE) ×6 IMPLANT
GLOVE BIO SURGEON STRL SZ7.5 (GLOVE) ×2 IMPLANT
GLOVE BIOGEL PI IND STRL 8 (GLOVE) ×2 IMPLANT
GLOVE BIOGEL PI INDICATOR 8 (GLOVE) ×2
GOWN STRL REUS W/ TWL LRG LVL3 (GOWN DISPOSABLE) ×3 IMPLANT
GOWN STRL REUS W/TWL LRG LVL3 (GOWN DISPOSABLE) ×3
GRAFT GORETEX STRT 4-7X45 (Vascular Products) ×2 IMPLANT
HEMOSTAT SPONGE AVITENE ULTRA (HEMOSTASIS) ×2 IMPLANT
KIT BASIN OR (CUSTOM PROCEDURE TRAY) ×2 IMPLANT
KIT TURNOVER KIT B (KITS) ×2 IMPLANT
LOOP VESSEL MINI RED (MISCELLANEOUS) ×2 IMPLANT
PACK CV ACCESS (CUSTOM PROCEDURE TRAY) ×2 IMPLANT
PAD ARMBOARD 7.5X6 YLW CONV (MISCELLANEOUS) ×4 IMPLANT
SUT PROLENE 6 0 CC (SUTURE) ×8 IMPLANT
SUT VIC AB 3-0 SH 27 (SUTURE) ×2
SUT VIC AB 3-0 SH 27X BRD (SUTURE) ×2 IMPLANT
SUT VICRYL 4-0 PS2 18IN ABS (SUTURE) ×4 IMPLANT
TOWEL GREEN STERILE (TOWEL DISPOSABLE) ×2 IMPLANT
UNDERPAD 30X30 (UNDERPADS AND DIAPERS) ×2 IMPLANT
WATER STERILE IRR 1000ML POUR (IV SOLUTION) ×2 IMPLANT

## 2018-06-19 NOTE — Anesthesia Procedure Notes (Signed)
Procedure Name: MAC Date/Time: 06/19/2018 10:11 AM Performed by: Carney Living, CRNA Pre-anesthesia Checklist: Patient identified, Emergency Drugs available, Suction available, Patient being monitored and Timeout performed Patient Re-evaluated:Patient Re-evaluated prior to induction Oxygen Delivery Method: Simple face mask

## 2018-06-19 NOTE — Op Note (Signed)
Procedure: Left forearm AV graft  Preoperative diagnosis: CKD 5  Postoperative diagnosis: Same  Anesthesia: Local with sedation  Assistant: Leontine Locket PA-C  Operative findings: 4-7 mm tapered PTFE graft  Operative details: After obtaining informed consent, the patient was taken to the operating room. The patient was placed in supine position on the operating room table. After adequate sedation, the patient's entire left upper extremity was prepped and draped in usual sterile fashion. A longitudinal incision was made in the left antecubital crease. Incision was carried down through the subcutaneous tissues down to the level of the brachial artery. Next the brachial artery was dissected free in the medial portion incision. The brachial artery was approximately 3 mm in diameter and soft in character. Adjacent deep brachial vein was also approximately 3 mm in diameter. This was dissected free circumferentially to the selected as the outflow for the graft. Next a transverse incision was made in the distal forearm for assistance in tunneling. A subcutaneous tunnel was then created in a loop configuration down the forearm and a 4-7 mm tapered PTFE graft was brought through this subcutaneous tunnel with the arterial limb on the medial aspect of the forearm. The patient was given 5000 units of intravenous heparin. After approximately 2 minutes of circulation time, Vesseloops were used to control the brachial artery proximally and distally. A longitudinal opening was made in the brachial artery the 4 mm end of the graft was slightly beveled and sewn end of graft to side of artery using a running 6-0 Prolene suture. Prior to completion of the anastomosis the artery was forward bled back bled and thoroughly flushed. The anastomosis was secured, vessel loops released, and there was pulsatile flow in the graft immediately. Attention was then turned to the venous limb of the graft. Small bulldog clamps were used to  control the outflow vein proximally and distally. A longitudinal opening was made in the deep brachial vein the graft was cut to length and spatulated and sewn end of graft to side of vein using a running 6-0 Prolene suture. Just prior to completion of anastomosis it was forward bled back bled and thoroughly flushed. The anastomosis was secured, Vesseloops were released, and  there was a palpable thrill above the graft immediately. Hemostasis was obtained with direct pressure and the assistance of 50 mg of protamine. Next subcutaneous tissues of both incisions reapproximated using a running 3-0 Vicryl suture. The skin of both incisions was closed with 4-0 Vicryl subcuticular stitch. Dermabond was applied to both incisions.  The patient our procedure well there are no complications. Instrument sponge and needle counts correct in the case. The patient was taken to the recovery room in stable condition. The patient had a palpable radial pulse at the end of the case.  Ruta Hinds, MD [facility] Office: [phone] Pager: [phone]

## 2018-06-19 NOTE — Transfer of Care (Addendum)
Immediate Anesthesia Transfer of Care Note  Patient: Priscilla Davis  Procedure(s) Performed: INSERTION OF 4-7MM X 45CM ARTERIOVENOUS (AV) GORE-TEX GRAFT LEFT  ARM (Left Arm Lower)  Patient Location: PACU  Anesthesia Type:MAC  Level of Consciousness: awake, alert , oriented and patient cooperative  Airway & Oxygen Therapy: Patient Spontanous Breathing and Patient connected to nasal cannula oxygen  Post-op Assessment: Report given to RN, Post -op Vital signs reviewed and stable and Patient moving all extremities X 4  Post vital signs: Reviewed and stable  Last Vitals:  Vitals Value Taken Time  BP 143/65 06/19/2018 11:54 AM  Temp    Pulse 84 06/19/2018 11:55 AM  Resp 13 06/19/2018 11:55 AM  SpO2 100 % 06/19/2018 11:55 AM  Vitals shown include unvalidated device data.  Last Pain:  Vitals:   06/19/18 0806  TempSrc: Oral  PainSc: 0-No pain      Patients Stated Pain Goal: 3 (50/35/46 5681)  Complications: No apparent anesthesia complications and Pts CBG - is 33. Pt is alert and oriented and totally asympromatic, Dr. Ermalene Postin notified and ordered received for D50, 1 amp iv

## 2018-06-19 NOTE — Anesthesia Preprocedure Evaluation (Addendum)
Anesthesia Evaluation  Patient identified by MRN, date of birth, ID band Patient awake    Reviewed: Allergy & Precautions, NPO status , Patient's Chart, lab work & pertinent test results, reviewed documented beta blocker date and time   History of Anesthesia Complications Negative for: history of anesthetic complications  Airway Mallampati: III  TM Distance: >3 FB Neck ROM: Full    Dental  (+) Teeth Intact, Dental Advisory Given   Pulmonary neg shortness of breath, asthma , neg sleep apnea, neg recent URI, Current Smoker,    breath sounds clear to auscultation       Cardiovascular hypertension, Pt. on medications and Pt. on home beta blockers (-) angina(-) Past MI and (-) CHF  Rhythm:Regular     Neuro/Psych PSYCHIATRIC DISORDERS Depression    GI/Hepatic Neg liver ROS, GERD  Medicated and Controlled,  Endo/Other  diabetes, Type 2, Insulin Dependent  Renal/GU CRFRenal disease     Musculoskeletal   Abdominal   Peds  Hematology  (+) anemia ,   Anesthesia Other Findings   Reproductive/Obstetrics                            Anesthesia Physical Anesthesia Plan  ASA: III  Anesthesia Plan: MAC   Post-op Pain Management:    Induction: Intravenous  PONV Risk Score and Plan: 1 and Ondansetron  Airway Management Planned: Nasal Cannula  Additional Equipment:   Intra-op Plan:   Post-operative Plan:   Informed Consent: I have reviewed the patients History and Physical, chart, labs and discussed the procedure including the risks, benefits and alternatives for the proposed anesthesia with the patient or authorized representative who has indicated his/her understanding and acceptance.   Dental advisory given  Plan Discussed with: CRNA, Surgeon and Anesthesiologist  Anesthesia Plan Comments:        Anesthesia Quick Evaluation

## 2018-06-19 NOTE — Discharge Instructions (Signed)
° °  [facility]  Discharge Instructions  AV Fistula or Graft Surgery for Dialysis Access  Please refer to the following instructions for your post-procedure care. Your surgeon or physician assistant will discuss any changes with you.  Activity  You may drive the day following your surgery, if you are comfortable and no longer taking prescription pain medication. Resume full activity as the soreness in your incision resolves.  Bathing/Showering  You may shower after you go home. Keep your incision dry for 48 hours. Do not soak in a bathtub, hot tub, or swim until the incision heals completely. You may not shower if you have a hemodialysis catheter.  Incision Care  Clean your incision with mild soap and water after 48 hours. Pat the area dry with a clean towel. You do not need a bandage unless otherwise instructed. Do not apply any ointments or creams to your incision. You may have skin glue on your incision. Do not peel it off. It will come off on its own in about one week. Your arm may swell a bit after surgery. To reduce swelling use pillows to elevate your arm so it is above your heart. Your doctor will tell you if you need to lightly wrap your arm with an ACE bandage.  Diet  Resume your normal diet. There are not special food restrictions following this procedure. In order to heal from your surgery, it is CRITICAL to get adequate nutrition. Your body requires vitamins, minerals, and protein. Vegetables are the best source of vitamins and minerals. Vegetables also provide the perfect balance of protein. Processed food has little nutritional value, so try to avoid this.  Medications  Resume taking all of your medications. If your incision is causing pain, you may take over-the counter pain relievers such as acetaminophen (Tylenol). If you were prescribed a stronger pain medication, please be aware these medications can cause nausea and constipation. Prevent  nausea by taking the medication with a snack or meal. Avoid constipation by drinking plenty of fluids and eating foods with high amount of fiber, such as fruits, vegetables, and grains.  Do not take Tylenol if you are taking prescription pain medications.  Follow up Your surgeon may want to see you in the office following your access surgery. If so, this will be arranged at the time of your surgery.  Please call us immediately for any of the following conditions:  Increased pain, redness, drainage (pus) from your incision site Fever of 101 degrees or higher Severe or worsening pain at your incision site Hand pain or numbness.  Reduce your risk of vascular disease:  Stop smoking. If you would like help, call [facility] at 1-800-QUIT-NOW 6315764617) or [place] at [place] your cholesterol Maintain a desired weight Control your diabetes Keep your blood pressure down  Dialysis  It will take several weeks to several months for your new dialysis access to be ready for use. Your surgeon will determine when it is okay to use it. Your nephrologist will continue to direct your dialysis. You can continue to use your Permcath until your new access is ready for use.   06/19/2018 Priscilla Davis [id number] 08-Apr-1951  Surgeon(s): Fields, Jessy Oto, MD  Procedure(s): INSERTION OF 4-7MM X 45CM ARTERIOVENOUS (AV) GORE-TEX GRAFT LEFT  ARM  x Do not stick graft for 4 weeks    If you have any questions, please call the office at [phone].

## 2018-06-19 NOTE — Interval H&P Note (Signed)
History and Physical Interval Note:  06/19/2018 9:44 AM  Priscilla Davis  has presented today for surgery, with the diagnosis of CHRONIC KIDNEY DISEASE FOR HEMODIALYSIS ACCESS  The various methods of treatment have been discussed with the patient and family. After consideration of risks, benefits and other options for treatment, the patient has consented to  Procedure(s): INSERTION OF ARTERIOVENOUS (AV) GORE-TEX GRAFT LEFT  ARM (Left) as a surgical intervention .  The patient's history has been reviewed, patient examined, no change in status, stable for surgery.  I have reviewed the patient's chart and labs.  Questions were answered to the patient's satisfaction.     Ruta Hinds

## 2018-06-20 ENCOUNTER — Telehealth: Payer: Self-pay | Admitting: Vascular Surgery

## 2018-06-20 ENCOUNTER — Encounter (HOSPITAL_COMMUNITY): Payer: Self-pay | Admitting: Vascular Surgery

## 2018-06-20 NOTE — Telephone Encounter (Signed)
sch appt spk to pt 07/12/18 3pm p/o PA

## 2018-06-20 NOTE — Telephone Encounter (Signed)
-----   Message from Gabriel Earing, [place] sent at 06/19/2018 11:35 AM EDT ----- S/p left forearm loop AVG 06/19/18.  F/u in [place] clinic on Dr. Oneida Alar clinic day in 2-3 weeks.  Thanks

## 2018-06-20 NOTE — Anesthesia Postprocedure Evaluation (Signed)
Anesthesia Post Note  Patient: Priscilla Davis  Procedure(s) Performed: INSERTION OF 4-7MM X 45CM ARTERIOVENOUS (AV) GORE-TEX GRAFT LEFT  ARM (Left Arm Lower)     Patient location during evaluation: PACU Anesthesia Type: MAC Level of consciousness: awake and alert Pain management: pain level controlled Vital Signs Assessment: post-procedure vital signs reviewed and stable Respiratory status: spontaneous breathing, nonlabored ventilation, respiratory function stable and patient connected to nasal cannula oxygen Cardiovascular status: stable and blood pressure returned to baseline Postop Assessment: no apparent nausea or vomiting Anesthetic complications: no    Last Vitals:  Vitals:   06/19/18 1230 06/19/18 1250  BP:  133/65  Pulse: 81 83  Resp: 11 14  Temp:  (!) 36.4 C  SpO2: 97% 100%    Last Pain:  Vitals:   06/19/18 1230  TempSrc:   PainSc: 0-No pain                 Euretha Najarro

## 2018-07-12 ENCOUNTER — Ambulatory Visit (INDEPENDENT_AMBULATORY_CARE_PROVIDER_SITE_OTHER): Payer: Medicare HMO | Admitting: Physician Assistant

## 2018-07-12 ENCOUNTER — Other Ambulatory Visit: Payer: Self-pay

## 2018-07-12 DIAGNOSIS — N184 Chronic kidney disease, stage 4 (severe): Secondary | ICD-10-CM

## 2018-07-12 NOTE — Progress Notes (Signed)
    Postoperative Access Visit   History of Present Illness   Priscilla Davis is a 67 y.o. year old female who presents for postoperative follow-up for: left forearm arteriovenous graft by Dr. Oneida Alar (Date: 06/19/18).  The patient's wounds are healed.  The patient denies steal symptoms.  The patient is able to complete their activities of daily living.  She is not yet on hemodialysis.  CKD is managed by Dr. Johnney Ou.   Physical Examination   Vitals:   07/12/18 1500  BP: (!) 150/72  Pulse: 78  Resp: 16  Temp: 98.3 F (36.8 C)  TempSrc: Oral  SpO2: 100%  Weight: 232 lb (105.2 kg)  Height: 5\' 8"  (1.727 m)   Body mass index is 35.28 kg/m.  left arm Incisions are healed, hand grip is 5/5, sensation in digits is intact, palpable thrill, bruit can be auscultated; palpable L radial pulse     Medical Decision Making   Priscilla Davis is a 67 y.o. year old female who presents s/p left forearm arteriovenous graft   Patent L forearm loop graft with palpable thrill; no steal symptoms  The patient's access will be ready for use 07/17/18  She may follow up on a prn basis   Dagoberto Ligas PA-C [facility] Office: [phone]

## 2018-09-07 ENCOUNTER — Emergency Department (HOSPITAL_COMMUNITY): Payer: Medicare Other

## 2018-09-07 ENCOUNTER — Inpatient Hospital Stay (HOSPITAL_COMMUNITY)
Admission: EM | Admit: 2018-09-07 | Discharge: 2018-09-12 | DRG: 280 | Disposition: A | Discharge status: Home / Self Care | Payer: Medicare Other | Attending: Internal Medicine | Admitting: Internal Medicine

## 2018-09-07 ENCOUNTER — Encounter (HOSPITAL_COMMUNITY): Payer: Self-pay

## 2018-09-07 ENCOUNTER — Other Ambulatory Visit: Payer: Self-pay

## 2018-09-07 DIAGNOSIS — J9601 Acute respiratory failure with hypoxia: Secondary | ICD-10-CM | POA: Diagnosis present

## 2018-09-07 DIAGNOSIS — I2489 Other forms of acute ischemic heart disease: Secondary | ICD-10-CM

## 2018-09-07 DIAGNOSIS — J4521 Mild intermittent asthma with (acute) exacerbation: Secondary | ICD-10-CM | POA: Diagnosis not present

## 2018-09-07 DIAGNOSIS — F329 Major depressive disorder, single episode, unspecified: Secondary | ICD-10-CM | POA: Diagnosis present

## 2018-09-07 DIAGNOSIS — E11649 Type 2 diabetes mellitus with hypoglycemia without coma: Secondary | ICD-10-CM | POA: Diagnosis present

## 2018-09-07 DIAGNOSIS — Z89421 Acquired absence of other right toe(s): Secondary | ICD-10-CM

## 2018-09-07 DIAGNOSIS — I130 Hypertensive heart and chronic kidney disease with heart failure and stage 1 through stage 4 chronic kidney disease, or unspecified chronic kidney disease: Principal | ICD-10-CM | POA: Diagnosis present

## 2018-09-07 DIAGNOSIS — J4520 Mild intermittent asthma, uncomplicated: Secondary | ICD-10-CM | POA: Diagnosis present

## 2018-09-07 DIAGNOSIS — R0602 Shortness of breath: Secondary | ICD-10-CM

## 2018-09-07 DIAGNOSIS — I248 Other forms of acute ischemic heart disease: Secondary | ICD-10-CM

## 2018-09-07 DIAGNOSIS — K219 Gastro-esophageal reflux disease without esophagitis: Secondary | ICD-10-CM | POA: Diagnosis present

## 2018-09-07 DIAGNOSIS — J181 Lobar pneumonia, unspecified organism: Secondary | ICD-10-CM | POA: Diagnosis not present

## 2018-09-07 DIAGNOSIS — I214 Non-ST elevation (NSTEMI) myocardial infarction: Secondary | ICD-10-CM | POA: Diagnosis not present

## 2018-09-07 DIAGNOSIS — I519 Heart disease, unspecified: Secondary | ICD-10-CM | POA: Diagnosis not present

## 2018-09-07 DIAGNOSIS — E8889 Other specified metabolic disorders: Secondary | ICD-10-CM | POA: Diagnosis present

## 2018-09-07 DIAGNOSIS — I5023 Acute on chronic systolic (congestive) heart failure: Secondary | ICD-10-CM | POA: Diagnosis not present

## 2018-09-07 DIAGNOSIS — Z7982 Long term (current) use of aspirin: Secondary | ICD-10-CM | POA: Diagnosis not present

## 2018-09-07 DIAGNOSIS — I10 Essential (primary) hypertension: Secondary | ICD-10-CM

## 2018-09-07 DIAGNOSIS — D631 Anemia in chronic kidney disease: Secondary | ICD-10-CM | POA: Diagnosis present

## 2018-09-07 DIAGNOSIS — Z794 Long term (current) use of insulin: Secondary | ICD-10-CM

## 2018-09-07 DIAGNOSIS — E785 Hyperlipidemia, unspecified: Secondary | ICD-10-CM | POA: Diagnosis present

## 2018-09-07 DIAGNOSIS — J189 Pneumonia, unspecified organism: Secondary | ICD-10-CM | POA: Diagnosis present

## 2018-09-07 DIAGNOSIS — Z79899 Other long term (current) drug therapy: Secondary | ICD-10-CM

## 2018-09-07 DIAGNOSIS — F17210 Nicotine dependence, cigarettes, uncomplicated: Secondary | ICD-10-CM | POA: Diagnosis present

## 2018-09-07 DIAGNOSIS — F17200 Nicotine dependence, unspecified, uncomplicated: Secondary | ICD-10-CM | POA: Diagnosis present

## 2018-09-07 DIAGNOSIS — I160 Hypertensive urgency: Secondary | ICD-10-CM | POA: Diagnosis present

## 2018-09-07 DIAGNOSIS — I447 Left bundle-branch block, unspecified: Secondary | ICD-10-CM | POA: Diagnosis present

## 2018-09-07 DIAGNOSIS — I4581 Long QT syndrome: Secondary | ICD-10-CM | POA: Diagnosis present

## 2018-09-07 DIAGNOSIS — N184 Chronic kidney disease, stage 4 (severe): Secondary | ICD-10-CM | POA: Diagnosis present

## 2018-09-07 DIAGNOSIS — M1990 Unspecified osteoarthritis, unspecified site: Secondary | ICD-10-CM | POA: Diagnosis present

## 2018-09-07 DIAGNOSIS — E119 Type 2 diabetes mellitus without complications: Secondary | ICD-10-CM | POA: Diagnosis present

## 2018-09-07 DIAGNOSIS — N179 Acute kidney failure, unspecified: Secondary | ICD-10-CM | POA: Diagnosis present

## 2018-09-07 DIAGNOSIS — E1122 Type 2 diabetes mellitus with diabetic chronic kidney disease: Secondary | ICD-10-CM

## 2018-09-07 DIAGNOSIS — J9621 Acute and chronic respiratory failure with hypoxia: Secondary | ICD-10-CM | POA: Diagnosis not present

## 2018-09-07 DIAGNOSIS — R062 Wheezing: Secondary | ICD-10-CM

## 2018-09-07 LAB — CBC
HEMATOCRIT: 30.7 % — AB (ref 36.0–46.0)
Hemoglobin: 9.1 g/dL — ABNORMAL LOW (ref 12.0–15.0)
MCH: 27 pg (ref 26.0–34.0)
MCHC: 29.6 g/dL — AB (ref 30.0–36.0)
MCV: 91.1 fL (ref 80.0–100.0)
NRBC: 0 % (ref 0.0–0.2)
PLATELETS: 337 10*3/uL (ref 150–400)
RBC: 3.37 MIL/uL — ABNORMAL LOW (ref 3.87–5.11)
RDW: 14 % (ref 11.5–15.5)
WBC: 14.5 10*3/uL — ABNORMAL HIGH (ref 4.0–10.5)

## 2018-09-07 LAB — I-STAT CG4 LACTIC ACID, ED
Lactic Acid, Venous: 2.11 mmol/L (ref 0.5–1.9)
Lactic Acid, Venous: 2.56 mmol/L (ref 0.5–1.9)

## 2018-09-07 LAB — GLUCOSE, CAPILLARY
GLUCOSE-CAPILLARY: 227 mg/dL — AB (ref 70–99)
Glucose-Capillary: 336 mg/dL — ABNORMAL HIGH (ref 70–99)

## 2018-09-07 LAB — BASIC METABOLIC PANEL
ANION GAP: 11 (ref 5–15)
BUN: 37 mg/dL — ABNORMAL HIGH (ref 8–23)
CALCIUM: 8.5 mg/dL — AB (ref 8.9–10.3)
CO2: 21 mmol/L — AB (ref 22–32)
Chloride: 110 mmol/L (ref 98–111)
Creatinine, Ser: 4.16 mg/dL — ABNORMAL HIGH (ref 0.44–1.00)
GFR calc Af Amer: 12 mL/min — ABNORMAL LOW (ref >60)
GFR, EST NON AFRICAN AMERICAN: 10 mL/min — AB (ref >60)
GLUCOSE: 179 mg/dL — AB (ref 70–99)
Potassium: 4.1 mmol/L (ref 3.5–5.1)
Sodium: 142 mmol/L (ref 135–145)

## 2018-09-07 LAB — INFLUENZA PANEL BY PCR (TYPE A & B)
Influenza A By PCR: NEGATIVE
Influenza B By PCR: NEGATIVE

## 2018-09-07 LAB — STREP PNEUMONIAE URINARY ANTIGEN: Strep Pneumo Urinary Antigen: NEGATIVE

## 2018-09-07 LAB — I-STAT TROPONIN, ED: Troponin i, poc: 0.01 ng/mL (ref 0.00–0.08)

## 2018-09-07 MED ORDER — GABAPENTIN 400 MG PO CAPS
400.0000 mg | ORAL_CAPSULE | Freq: Three times a day (TID) | ORAL | Status: DC
  Administered 2018-09-07 – 2018-09-08 (×3): 400 mg via ORAL
  Filled 2018-09-07 (×3): qty 1

## 2018-09-07 MED ORDER — INSULIN ASPART 100 UNIT/ML ~~LOC~~ SOLN
0.0000 [IU] | Freq: Every day | SUBCUTANEOUS | Status: DC
  Administered 2018-09-07: 2 [IU] via SUBCUTANEOUS

## 2018-09-07 MED ORDER — INSULIN DETEMIR 100 UNIT/ML ~~LOC~~ SOLN
48.0000 [IU] | Freq: Two times a day (BID) | SUBCUTANEOUS | Status: DC
  Administered 2018-09-07: 48 [IU] via SUBCUTANEOUS
  Filled 2018-09-07 (×3): qty 0.48

## 2018-09-07 MED ORDER — SODIUM CHLORIDE 0.9 % IV SOLN
500.0000 mg | INTRAVENOUS | Status: DC
  Administered 2018-09-07: 500 mg via INTRAVENOUS
  Filled 2018-09-07: qty 500

## 2018-09-07 MED ORDER — IPRATROPIUM BROMIDE 0.02 % IN SOLN
0.5000 mg | Freq: Once | RESPIRATORY_TRACT | Status: AC
  Administered 2018-09-07: 0.5 mg via RESPIRATORY_TRACT
  Filled 2018-09-07: qty 2.5

## 2018-09-07 MED ORDER — ASPIRIN EC 81 MG PO TBEC
81.0000 mg | DELAYED_RELEASE_TABLET | Freq: Every day | ORAL | Status: DC
  Administered 2018-09-07 – 2018-09-08 (×2): 81 mg via ORAL
  Filled 2018-09-07 (×2): qty 1

## 2018-09-07 MED ORDER — SODIUM CHLORIDE 0.9 % IV SOLN
1.0000 g | INTRAVENOUS | Status: DC
  Administered 2018-09-08 – 2018-09-10 (×3): 1 g via INTRAVENOUS
  Filled 2018-09-07 (×3): qty 10

## 2018-09-07 MED ORDER — HEPARIN SODIUM (PORCINE) 5000 UNIT/ML IJ SOLN
5000.0000 [IU] | Freq: Three times a day (TID) | INTRAMUSCULAR | Status: DC
  Administered 2018-09-07 – 2018-09-08 (×4): 5000 [IU] via SUBCUTANEOUS
  Filled 2018-09-07 (×4): qty 1

## 2018-09-07 MED ORDER — ROSUVASTATIN CALCIUM 40 MG PO TABS
40.0000 mg | ORAL_TABLET | Freq: Every day | ORAL | Status: DC
  Administered 2018-09-07: 40 mg via ORAL
  Filled 2018-09-07 (×2): qty 1
  Filled 2018-09-07: qty 2

## 2018-09-07 MED ORDER — LIRAGLUTIDE 18 MG/3ML ~~LOC~~ SOPN: 1.8000 mg | PEN_INJECTOR | Freq: Every evening | SUBCUTANEOUS | Status: DC

## 2018-09-07 MED ORDER — LOSARTAN POTASSIUM 50 MG PO TABS
25.0000 mg | ORAL_TABLET | Freq: Every day | ORAL | Status: DC
  Administered 2018-09-07: 25 mg via ORAL
  Filled 2018-09-07: qty 1

## 2018-09-07 MED ORDER — INSULIN ASPART 100 UNIT/ML ~~LOC~~ SOLN
0.0000 [IU] | Freq: Three times a day (TID) | SUBCUTANEOUS | Status: DC
  Administered 2018-09-07: 7 [IU] via SUBCUTANEOUS
  Administered 2018-09-09: 1 [IU] via SUBCUTANEOUS
  Administered 2018-09-11: 2 [IU] via SUBCUTANEOUS

## 2018-09-07 MED ORDER — PANTOPRAZOLE SODIUM 40 MG PO TBEC
40.0000 mg | DELAYED_RELEASE_TABLET | Freq: Every day | ORAL | Status: DC
  Administered 2018-09-07 – 2018-09-12 (×6): 40 mg via ORAL
  Filled 2018-09-07 (×6): qty 1

## 2018-09-07 MED ORDER — VITAMIN B-12 1000 MCG PO TABS
1000.0000 ug | ORAL_TABLET | Freq: Every day | ORAL | Status: DC
  Administered 2018-09-07 – 2018-09-12 (×6): 1000 ug via ORAL
  Filled 2018-09-07 (×6): qty 1

## 2018-09-07 MED ORDER — DORZOLAMIDE HCL 2 % OP SOLN
1.0000 [drp] | Freq: Two times a day (BID) | OPHTHALMIC | Status: DC
  Administered 2018-09-07 – 2018-09-12 (×10): 1 [drp] via OPHTHALMIC
  Filled 2018-09-07: qty 10

## 2018-09-07 MED ORDER — ALBUTEROL SULFATE (2.5 MG/3ML) 0.083% IN NEBU
2.5000 mg | INHALATION_SOLUTION | RESPIRATORY_TRACT | Status: DC | PRN
  Administered 2018-09-08: 2.5 mg via RESPIRATORY_TRACT
  Filled 2018-09-07: qty 3

## 2018-09-07 MED ORDER — ALBUTEROL SULFATE (2.5 MG/3ML) 0.083% IN NEBU
5.0000 mg | INHALATION_SOLUTION | Freq: Once | RESPIRATORY_TRACT | Status: AC
  Administered 2018-09-07: 5 mg via RESPIRATORY_TRACT

## 2018-09-07 MED ORDER — SODIUM CHLORIDE 0.9 % IV BOLUS
500.0000 mL | Freq: Once | INTRAVENOUS | Status: AC
  Administered 2018-09-07: 500 mL via INTRAVENOUS

## 2018-09-07 MED ORDER — ADULT MULTIVITAMIN W/MINERALS CH
1.0000 | ORAL_TABLET | Freq: Every day | ORAL | Status: DC
  Administered 2018-09-07 – 2018-09-12 (×6): 1 via ORAL
  Filled 2018-09-07 (×6): qty 1

## 2018-09-07 MED ORDER — AMLODIPINE BESYLATE 10 MG PO TABS
10.0000 mg | ORAL_TABLET | Freq: Every day | ORAL | Status: DC
  Administered 2018-09-07 – 2018-09-10 (×4): 10 mg via ORAL
  Filled 2018-09-07 (×4): qty 1

## 2018-09-07 MED ORDER — LATANOPROST 0.005 % OP SOLN
1.0000 [drp] | Freq: Every day | OPHTHALMIC | Status: DC
  Administered 2018-09-07 – 2018-09-11 (×5): 1 [drp] via OPHTHALMIC
  Filled 2018-09-07: qty 2.5

## 2018-09-07 MED ORDER — METOPROLOL TARTRATE 25 MG PO TABS
25.0000 mg | ORAL_TABLET | Freq: Two times a day (BID) | ORAL | Status: DC
  Administered 2018-09-07 – 2018-09-08 (×2): 25 mg via ORAL
  Filled 2018-09-07 (×3): qty 1

## 2018-09-07 MED ORDER — ALBUTEROL SULFATE (2.5 MG/3ML) 0.083% IN NEBU
5.0000 mg | INHALATION_SOLUTION | Freq: Once | RESPIRATORY_TRACT | Status: AC
  Administered 2018-09-07: 5 mg via RESPIRATORY_TRACT
  Filled 2018-09-07: qty 6

## 2018-09-07 MED ORDER — FLUOXETINE HCL 20 MG PO CAPS
20.0000 mg | ORAL_CAPSULE | Freq: Every day | ORAL | Status: DC
  Administered 2018-09-07 – 2018-09-08 (×2): 20 mg via ORAL
  Filled 2018-09-07 (×2): qty 1

## 2018-09-07 MED ORDER — ALBUTEROL SULFATE (2.5 MG/3ML) 0.083% IN NEBU
5.0000 mg | INHALATION_SOLUTION | Freq: Once | RESPIRATORY_TRACT | Status: DC
  Filled 2018-09-07: qty 6

## 2018-09-07 MED ORDER — VITAMIN D 25 MCG (1000 UNIT) PO TABS
5000.0000 [IU] | ORAL_TABLET | Freq: Every day | ORAL | Status: DC
  Administered 2018-09-07 – 2018-09-12 (×6): 5000 [IU] via ORAL
  Filled 2018-09-07 (×6): qty 5

## 2018-09-07 MED ORDER — SODIUM CHLORIDE 0.9 % IV BOLUS: 500.0000 mL | Freq: Once | INTRAVENOUS | Status: DC

## 2018-09-07 MED ORDER — ACETAMINOPHEN 500 MG PO TABS: 1000.0000 mg | ORAL_TABLET | Freq: Four times a day (QID) | ORAL | Status: DC | PRN

## 2018-09-07 MED ORDER — AZITHROMYCIN 500 MG PO TABS
250.0000 mg | ORAL_TABLET | Freq: Every day | ORAL | Status: DC
  Administered 2018-09-08 – 2018-09-10 (×3): 250 mg via ORAL
  Filled 2018-09-07 (×3): qty 1

## 2018-09-07 MED ORDER — SODIUM CHLORIDE 0.9 % IV SOLN
2.0000 g | INTRAVENOUS | Status: DC
  Administered 2018-09-07: 2 g via INTRAVENOUS
  Filled 2018-09-07: qty 20

## 2018-09-07 NOTE — ED Provider Notes (Signed)
[place] EMERGENCY DEPARTMENT Provider Note   CSN: [id number] Arrival date & time: 09/07/18  4431     History   Chief Complaint Chief Complaint  Patient presents with  . Shortness of Breath    HPI Priscilla Davis is a 68 y.o. female.  Patient w hx renal insuff, reactive airway disease, c/o increased sob since yesterday. Symptoms gradual onset, moderate, persistent, worse today. +wheezing. +increased non prod cough. No sore throat or runny nose. No fever or chills. Denies chest pain or discomfort. No leg pain or swelling. States was previously given rx for an inhaler but denies regular inhaler use, denies hx asthma/copd. +smoker. States had left forearm fistula placed a couple months ago for possible future dialysis. Making normal amount of urine.   The history is provided by the patient and the EMS personnel.  Shortness of Breath  Associated symptoms: cough and wheezing   Associated symptoms: no abdominal pain, no chest pain, no fever, no headaches, no neck pain, no rash, no sore throat and no vomiting     Past Medical History:  Diagnosis Date  . Arthritis   . Asthma   . Depression   . Diabetes mellitus without complication ([place])    Type II  . GERD (gastroesophageal reflux disease)   . Hypertension   . Renal disorder    CKD 5    Patient Active Problem List   Diagnosis Date Noted  . Chronic kidney disease (CKD), stage IV (severe) ([place]) 07/12/2018    Past Surgical History:  Procedure Laterality Date  . AV FISTULA PLACEMENT Left 06/19/2018   Procedure: INSERTION OF 4-7MM X 45CM ARTERIOVENOUS (AV) GORE-TEX GRAFT LEFT  ARM;  Surgeon: Elam Dutch, MD;  Location: [facility];  Service: Vascular;  Laterality: Left;  . CESAREAN SECTION    . CHOLECYSTECTOMY    . COLONOSCOPY    . EYE SURGERY Bilateral    diabetic   . toe amputation Right    5th toe     OB History   No obstetric history on file.      Home Medications    Prior to Admission  medications   Medication Sig Start Date End Date Taking? Authorizing Provider  acetaminophen (TYLENOL) 325 MG tablet Take by mouth every 6 (six) hours as needed.    [provider]  albuterol (PROVENTIL HFA;VENTOLIN HFA) 108 (90 Base) MCG/ACT inhaler Inhale 2 puffs into the lungs every 6 (six) hours as needed for wheezing or shortness of breath.    [provider]  amLODipine (NORVASC) 10 MG tablet Take 10 mg by mouth daily.    [provider]  aspirin EC 81 MG tablet Take 81 mg by mouth daily.    [provider]  cholecalciferol (VITAMIN D) 1000 units tablet Take 1,000 Units by mouth daily.    [provider]  dorzolamide (TRUSOPT) 2 % ophthalmic solution Place 1 drop into both eyes 2 (two) times daily.    [provider]  FLUoxetine (PROZAC) 20 MG capsule Take 20 mg by mouth daily. 05/22/17   [provider]  gabapentin (NEURONTIN) 300 MG capsule Take 300 mg by mouth 3 (three) times daily.    [provider]  insulin detemir (LEVEMIR) 100 UNIT/ML injection Inject 48 Units into the skin 2 (two) times daily.     [provider]  latanoprost (XALATAN) 0.005 % ophthalmic solution Place 1 drop into both eyes at bedtime.    [provider]  liraglutide [name])  18 MG/3ML SOPN Inject 2.8 mg into the skin every evening.    [provider]  losartan (COZAAR) 25 MG tablet Take 25 mg by mouth daily.    [provider]  metoprolol tartrate (LOPRESSOR) 25 MG tablet Take 25 mg by mouth 2 (two) times daily.    [provider]  omeprazole (PRILOSEC) 20 MG capsule Take 20 mg by mouth daily.    [provider]  oxyCODONE (ROXICODONE) 5 MG immediate release tablet Take 1 tablet (5 mg total) by mouth every 6 (six) hours as needed for severe pain. Patient not taking: Reported on 07/12/2018 06/19/18   Priscilla Earing, PA-C  rosuvastatin (CRESTOR) 20 MG tablet Take 20 mg by mouth at bedtime.     [provider]  vitamin B-12 (CYANOCOBALAMIN) 1000 MCG tablet Take 1,000 mcg by mouth daily.    [provider]    Family History Family History  Problem Relation Age of Onset  . Peripheral vascular disease Mother   . Hypertension Mother     Social History Social History   Tobacco Use  . Smoking status: Current Every Day Smoker    Packs/day: 0.25    Years: 30.00    Pack years: 7.50    Types: Cigarettes  . Smokeless tobacco: Never Used  Substance Use Topics  . Alcohol use: Yes    Comment: seldom  . Drug use: Never     Allergies   Patient has no known allergies.   Review of Systems Review of Systems  Constitutional: Negative for fever.  HENT: Negative for sore throat.   Eyes: Negative for redness.  Respiratory: Positive for cough, shortness of breath and wheezing.   Cardiovascular: Negative for chest pain and leg swelling.  Gastrointestinal: Negative for abdominal pain and vomiting.  Genitourinary: Negative for flank pain.  Musculoskeletal: Negative for back pain and neck pain.  Skin: Negative for rash.  Neurological: Negative for headaches.  Hematological: Does not bruise/bleed easily.  Psychiatric/Behavioral: Negative for confusion.     Physical Exam Updated Vital Signs BP (!) 180/66 (BP Location: Right Arm)   Pulse 89   Temp 98.4 F (36.9 C) (Oral)   Resp 16   Ht 1.727 m (5\' 8" )   Wt 99.8 kg   SpO2 95%   BMI 33.45 kg/m   Physical Exam Vitals signs and nursing note reviewed.  Constitutional:      Appearance: Normal appearance. She is well-developed.  HENT:     Head: Atraumatic.     Nose: Nose normal.     Mouth/Throat:     Mouth: Mucous membranes are moist.     Pharynx: Oropharynx is clear.  Eyes:     General: No scleral icterus.    Conjunctiva/sclera: Conjunctivae normal.     Pupils: Pupils are equal, round, and reactive to light.  Neck:     Musculoskeletal: Normal range of motion and neck supple. No neck rigidity or  muscular tenderness.     Trachea: No tracheal deviation.  Cardiovascular:     Rate and Rhythm: Normal rate and regular rhythm.     Pulses: Normal pulses.     Heart sounds: Normal heart sounds. No murmur. No friction rub. No gallop.   Pulmonary:     Effort: Pulmonary effort is normal. No respiratory distress.     Breath sounds: Wheezing present.  Abdominal:     General: Bowel sounds are normal. There is no distension.     Palpations: Abdomen is soft.  Tenderness: There is no abdominal tenderness. There is no guarding.  Genitourinary:    Comments: No cva tenderness.  Musculoskeletal:        General: No swelling.     Right lower leg: No edema.     Left lower leg: No edema.     Comments: Left UE av fistula w palp thrill.   Skin:    General: Skin is warm and dry.     Findings: No rash.  Neurological:     Mental Status: She is alert.     Comments: Alert, speech normal.   Psychiatric:        Mood and Affect: Mood normal.      ED Treatments / Results  Labs (all labs ordered are listed, but only abnormal results are displayed) Results for orders placed or performed during the hospital encounter of 09/07/18  CBC  Result Value Ref Range   WBC 14.5 (H) 4.0 - 10.5 K/uL   RBC 3.37 (L) 3.87 - 5.11 MIL/uL   Hemoglobin 9.1 (L) 12.0 - 15.0 g/dL   HCT 30.7 (L) 36.0 - 46.0 %   MCV 91.1 80.0 - 100.0 fL   MCH 27.0 26.0 - 34.0 pg   MCHC 29.6 (L) 30.0 - 36.0 g/dL   RDW 14.0 11.5 - 15.5 %   Platelets 337 150 - 400 K/uL   nRBC 0.0 0.0 - 0.2 %  Basic metabolic panel  Result Value Ref Range   Sodium 142 135 - 145 mmol/L   Potassium 4.1 3.5 - 5.1 mmol/L   Chloride 110 98 - 111 mmol/L   CO2 21 (L) 22 - 32 mmol/L   Glucose, Bld 179 (H) 70 - 99 mg/dL   BUN 37 (H) 8 - 23 mg/dL   Creatinine, Ser 4.16 (H) 0.44 - 1.00 mg/dL   Calcium 8.5 (L) 8.9 - 10.3 mg/dL   GFR calc non Af Amer 10 (L) >60 mL/min   GFR calc Af Amer 12 (L) >60 mL/min   Anion gap 11 5 - 15  I-stat troponin, ED  Result  Value Ref Range   Troponin i, poc 0.01 0.00 - 0.08 ng/mL   Comment 3           Dg Chest 2 View  Result Date: 09/07/2018 CLINICAL DATA:  Shortness of breath and productive cough. EXAM: CHEST - 2 VIEW COMPARISON:  06/10/2018 FINDINGS: The patient has new bilateral upper lobe infiltrates and right middle or lower lobe infiltrate. Mild chronic cardiomegaly. Pulmonary vascularity is normal. No effusions. No acute bone abnormality. IMPRESSION: Bilateral pneumonia. Electronically Signed   By: Lorriane Shire M.D.   On: 09/07/2018 08:56    EKG EKG Interpretation  Date/Time:  Friday September 07 2018 07:51:09 EST Ventricular Rate:  89 PR Interval:    QRS Duration: 108 QT Interval:  416 QTC Calculation: 507 R Axis:   92 Text Interpretation:  Sinus rhythm Right axis deviation QT prolonged `mild st dep inf/lat Confirmed by Lajean Saver [phone]) on 09/07/2018 7:57:26 AM   Radiology Dg Chest 2 View  Result Date: 09/07/2018 CLINICAL DATA:  Shortness of breath and productive cough. EXAM: CHEST - 2 VIEW COMPARISON:  06/10/2018 FINDINGS: The patient has new bilateral upper lobe infiltrates and right middle or lower lobe infiltrate. Mild chronic cardiomegaly. Pulmonary vascularity is normal. No effusions. No acute bone abnormality. IMPRESSION: Bilateral pneumonia. Electronically Signed   By: Lorriane Shire M.D.   On: 09/07/2018 08:56    Procedures Procedures (including critical care time)  Medications Ordered in ED Medications  albuterol (PROVENTIL) (2.5 MG/3ML) 0.083% nebulizer solution 5 mg (has no administration in time range)  albuterol (PROVENTIL) (2.5 MG/3ML) 0.083% nebulizer solution 5 mg (has no administration in time range)  ipratropium (ATROVENT) nebulizer solution 0.5 mg (has no administration in time range)     Initial Impression / Assessment and Plan / ED Course  I have reviewed the triage vital signs and the nursing notes.  Pertinent labs & imaging results that were available during  my care of the patient were reviewed by me and considered in my medical decision making (see chart for details).  EMS gave albuterol neb and solumedrol.  Persistent wheezing. Albuterol and atrovent neb. Cxr. Ecg. Labs.   Reviewed nursing notes and prior charts for additional history.   cxr reviewed - bilateral pneumonia.   Cultures sent. Flu panel added to labs. Iv abx given.   Room air pulse ox is 88%. Wheezing improved but persists.   Additional nebs.   Hospitalists consulted for admission re dyspnea, bilateral pna, hypoxia, wheezing.     Final Clinical Impressions(s) / ED Diagnoses   Final diagnoses:  None    ED Discharge Orders    None       Lajean Saver, MD 09/07/18 [phone]

## 2018-09-07 NOTE — H&P (Addendum)
History and Physical    Priscilla Davis:[id number] DOB: September 14, 1950 DOA: 09/07/2018  PCP: Kristen Loader, FNP  Patient coming from: home   I have personally briefly reviewed patient's old medical records in [place] and Care everywhere.   Chief Complaint: Sob and wheezing  HPI: Priscilla Davis is a 68 y.o. female with medical history significant of type 2 diabetes, diabetic kidney disease stage IV in preparation for hemodialysis, hypertension, asthma and ongoing smoker presents to the hospital with 2 days history of cough and shortness of breath.  According the patient, she does have a history of asthma but never needed to be hospitalized.  She started getting increasingly short of breath since last 2 days, gradually worsening in severity, persistent, exacerbated by ambulation, not relieved by nebulizer at home.  Cough is mostly green and mucoid.  Denies any fever, flulike symptoms.  Denies any congestion, stuffy nose, headache, nausea, vomiting, body ache.  Denies any throat pain, dysphagia.  Denies any chest pain.  No abdominal pain.  She is urinating normally.  Normal bowel movements.  No leg swelling.  No sick contacts.  No recent travel.  She had her flu shot this year. ED Course: Patient is on room air.  Slightly hypertensive but otherwise stable.  Obviously wheezing at rest.  Given multiple doses of albuterol with some improvement.  Her creatinine is 4.16 that was 3.79, 2 months ago.  A chest x-ray shows bilateral pneumonia, interstitial edema.  WBC count is 14,000.  Patient was given multiple nebulizers and Rocephin 2 g and azithromycin 500 mg IV.  Due to significant symptoms, admission was requested.  Review of Systems: As per HPI otherwise 10 point review of systems negative.    Past Medical History:  Diagnosis Date  . Arthritis   . Asthma   . Depression   . Diabetes mellitus without complication ([place])    Type II  . GERD (gastroesophageal reflux disease)   . Hypertension   . Renal  disorder    CKD 5    Past Surgical History:  Procedure Laterality Date  . AV FISTULA PLACEMENT Left 06/19/2018   Procedure: INSERTION OF 4-7MM X 45CM ARTERIOVENOUS (AV) GORE-TEX GRAFT LEFT  ARM;  Surgeon: Elam Dutch, MD;  Location: [facility];  Service: Vascular;  Laterality: Left;  . CESAREAN SECTION    . CHOLECYSTECTOMY    . COLONOSCOPY    . EYE SURGERY Bilateral    diabetic   . toe amputation Right    5th toe     reports that she has been smoking cigarettes. She has a 7.50 pack-year smoking history. She has never used smokeless tobacco. She reports current alcohol use. She reports that she does not use drugs.  No Known Allergies  Family History  Problem Relation Age of Onset  . Peripheral vascular disease Mother   . Hypertension Mother      Prior to Admission medications   Medication Sig Start Date End Date Taking? Authorizing Provider  Acetaminophen 500 MG coapsule Take 1,000 mg by mouth every 6 (six) hours as needed for mild pain or moderate pain.    Yes [provider]  albuterol (PROVENTIL HFA;VENTOLIN HFA) 108 (90 Base) MCG/ACT inhaler Inhale 2 puffs into the lungs every 6 (six) hours as needed for wheezing or shortness of breath.   Yes [provider]  amLODipine (NORVASC) 10 MG tablet Take 10 mg by mouth daily.   Yes [provider]  aspirin EC 81 MG tablet Take 81  mg by mouth daily.   Yes [provider]  Cholecalciferol (VITAMIN D) 125 MCG (5000 UT) CAPS Take 5,000 Units by mouth daily.    Yes [provider]  dorzolamide (TRUSOPT) 2 % ophthalmic solution Place 1 drop into both eyes 2 (two) times daily.   Yes [provider]  FLUoxetine (PROZAC) 20 MG capsule Take 20 mg by mouth daily. 05/22/17  Yes [provider]  gabapentin (NEURONTIN) 400 MG capsule Take 400 mg by mouth 3 (three) times daily.    Yes [provider]  insulin detemir (LEVEMIR) 100 UNIT/ML injection Inject 48 Units into the  skin 2 (two) times daily.    Yes [provider]  latanoprost (XALATAN) 0.005 % ophthalmic solution Place 1 drop into both eyes at bedtime.   Yes [provider]  liraglutide (VICTOZA) 18 MG/3ML SOPN Inject 1.8 mg into the skin every evening.    Yes [provider]  losartan (COZAAR) 25 MG tablet Take 25 mg by mouth daily.   Yes [provider]  metoprolol tartrate (LOPRESSOR) 25 MG tablet Take 25 mg by mouth 2 (two) times daily.   Yes [provider]  Multiple Vitamin (MULTIVITAMIN WITH MINERALS) TABS tablet Take 1 tablet by mouth daily. woman's   Yes [provider]  omeprazole (PRILOSEC) 20 MG capsule Take 40 mg by mouth daily.    Yes [provider]  ranitidine (ZANTAC) 150 MG tablet Take 150 mg by mouth daily.   Yes [provider]  Rosuvastatin Calcium 40 MG CPSP Take 40 mg by mouth at bedtime.    Yes [provider]  vitamin B-12 (CYANOCOBALAMIN) 1000 MCG tablet Take 1,000 mcg by mouth daily.   Yes [provider]    Physical Exam: Vitals:   09/07/18 0829 09/07/18 0830 09/07/18 0845 09/07/18 0930  BP:  (!) 175/66 (!) 174/69 (!) 168/77  Pulse:  86 87 88  Resp:  12 20 17   Temp:      TempSrc:      SpO2: 92% 96% 95% 96%  Weight:      Height:        Constitutional: NAD, calm, comfortable Vitals:   09/07/18 0829 09/07/18 0830 09/07/18 0845 09/07/18 0930  BP:  (!) 175/66 (!) 174/69 (!) 168/77  Pulse:  86 87 88  Resp:  12 20 17   Temp:      TempSrc:      SpO2: 92% 96% 95% 96%  Weight:      Height:       Eyes: PERRL, lids and conjunctivae normal ENMT: Mucous membranes are moist. Posterior pharynx clear of any exudate or lesions.Normal dentition.  Neck: normal, supple, no masses, no thyromegaly Respiratory: Patient is on room air, she has occasional expiratory wheezes, otherwise bilateral lung fields are clear.  no crackles. Normal respiratory effort. No accessory muscle use.    Cardiovascular: Regular rate and rhythm, no murmurs / rubs / gallops.  Trace bilateral extremity edema. 2+ pedal pulses. No carotid bruits.  Abdomen: no tenderness, no masses palpated. No hepatosplenomegaly. Bowel sounds positive.  Musculoskeletal: no clubbing / cyanosis. No joint deformity upper and lower extremities. Good ROM, no contractures. Normal muscle tone.  Skin: no rashes, lesions, ulcers. No induration Neurologic: CN 2-12 grossly intact. Sensation intact, DTR normal. Strength 5/5 in all 4.  Psychiatric: Normal judgment and insight. Alert and oriented x 3. Normal mood.   Left forearm AV fistula with thrill.  Labs on Admission: I have personally reviewed  following labs and imaging studies  CBC: Recent Labs  Lab 09/07/18 0805  WBC 14.5*  HGB 9.1*  HCT 30.7*  MCV 91.1  PLT 409   Basic Metabolic Panel: Recent Labs  Lab 09/07/18 0805  NA 142  K 4.1  CL 110  CO2 21*  GLUCOSE 179*  BUN 37*  CREATININE 4.16*  CALCIUM 8.5*   GFR: Estimated Creatinine Clearance: 15.8 mL/min (A) (by C-G formula based on SCr of 4.16 mg/dL (H)). Liver Function Tests: No results for input(s): AST, ALT, ALKPHOS, BILITOT, PROT, ALBUMIN in the last 168 hours. No results for input(s): LIPASE, AMYLASE in the last 168 hours. No results for input(s): AMMONIA in the last 168 hours. Coagulation Profile: No results for input(s): INR, PROTIME in the last 168 hours. Cardiac Enzymes: No results for input(s): CKTOTAL, CKMB, CKMBINDEX, TROPONINI in the last 168 hours. BNP (last 3 results) No results for input(s): PROBNP in the last 8760 hours. HbA1C: No results for input(s): HGBA1C in the last 72 hours. CBG: No results for input(s): GLUCAP in the last 168 hours. Lipid Profile: No results for input(s): CHOL, HDL, LDLCALC, TRIG, CHOLHDL, LDLDIRECT in the last 72 hours. Thyroid Function Tests: No results for input(s): TSH, T4TOTAL, FREET4, T3FREE, THYROIDAB in the last 72 hours. Anemia Panel: No  results for input(s): VITAMINB12, FOLATE, FERRITIN, TIBC, IRON, RETICCTPCT in the last 72 hours. Urine analysis:    Component Value Date/Time   COLORURINE YELLOW 07/26/2017 1000   APPEARANCEUR HAZY (A) 07/26/2017 1000   LABSPEC 1.015 07/26/2017 1000   PHURINE 6.0 07/26/2017 1000   GLUCOSEU NEGATIVE 07/26/2017 1000   HGBUR NEGATIVE 07/26/2017 1000   BILIRUBINUR NEGATIVE 07/26/2017 1000   KETONESUR NEGATIVE 07/26/2017 1000   PROTEINUR 100 (A) 07/26/2017 1000   NITRITE NEGATIVE 07/26/2017 1000   LEUKOCYTESUR TRACE (A) 07/26/2017 1000    Radiological Exams on Admission: Dg Chest 2 View  Result Date: 09/07/2018 CLINICAL DATA:  Shortness of breath and productive cough. EXAM: CHEST - 2 VIEW COMPARISON:  06/10/2018 FINDINGS: The patient has new bilateral upper lobe infiltrates and right middle or lower lobe infiltrate. Mild chronic cardiomegaly. Pulmonary vascularity is normal. No effusions. No acute bone abnormality. IMPRESSION: Bilateral pneumonia. Electronically Signed   By: Lorriane Shire M.D.   On: 09/07/2018 08:56    EKG: Independently reviewed.  Twelve-lead EKG shows sinus rhythm, mostly comparable to previous EKGs.  It shows prolonged QT.  Patient has chronically prolonged QT present on previous EKGs.  Assessment/Plan Principal Problem:   Bilateral pneumonia Active Problems:   Chronic kidney disease (CKD), stage IV (severe) ([facility])   Mild intermittent asthma   Type 2 diabetes mellitus with stage 4 chronic kidney disease ([facility])   Smoker   Pneumonia   Bilateral bacterial pneumonia: Agree with admission given severity of symptoms.  Hemodynamically stable.  Borderline elevated venous lactate.  Will treat as community-acquired bacterial pneumonia with Rocephin and azithromycin.  Patient received 2 g of Rocephin, can be treated with Rocephin 1 g every day as well as oral azithromycin.  Blood cultures were drawn in the ER. We will do influenza swab.  Check Legionella and streptococcal  antigen.  Sputum cultures. Deep breathing exercises, incentive spirometry, bronchodilator therapy, oxygen therapy and early ambulation.  Chronic kidney disease stage IV: Recently received fistula on her left arm.  Her renal function is at her usual levels with slight progression.  Potassium is normal.  Patient is not fluid overloaded.  There is no urgent indication for dialysis.  I  will notify her nephrologist team of patient being here.  Mild intermittent asthma: Exacerbated by above.  Bronchodilator as needed.  Nicotine abuse: Counseled patient about ongoing nicotine use.  Will provide with nicotine patch.  Type 2 diabetes: Patient on long-acting insulin and Victoza.  That she will continue.  Keep on sliding scale insulin for as needed coverage.  Hypertension: Has not received morning doses of medications.  She will resume amlodipine and losartan as well as metoprolol now.  DVT prophylaxis: Heparin subcu Code Status: Full code Family Communication: Patient capable of taking medical decisions. Disposition Plan: Home. Consults called: Nephrology team notified. Admission status: Inpatient, MedSurg.   Barb Merino MD [facility] Pager [phone]  If 7PM-7AM, please contact night-coverage [email or web address] Password [email or web address]  09/07/2018, 9:59 AM   Addendum: Patient had slight increase in venous lactate.  Her blood pressures are stable.  She is still on room air.  She has received 500 mL of IV fluids.  Will give 500 mL additional normal saline bolus.  We will continue to monitor.  Currently no evidence of sepsis.

## 2018-09-07 NOTE — ED Triage Notes (Signed)
Pt from home via ems; c/o increased sob that began yesterday evening, used home inhaler w/o relief; hx asthma; endorses congestion since November; fistula placed in L arm in October, has not yet begun dialysis  PTA: 15 mg albuterol 1 mg Atrovent 125 solumedrol  86% RA; placed on continuous neb 100% HR 100 RR 20 184/80 3 lead NSR 12 lead unremarkable w/ ems

## 2018-09-07 NOTE — ED Notes (Signed)
Patient transported to X-ray 

## 2018-09-07 NOTE — ED Notes (Signed)
ED TO INPATIENT HANDOFF REPORT  Name/Age/Gender Priscilla Davis 68 y.o. female  Code Status   Home/SNF/Other Home  Chief Complaint Sob  Level of Care/Admitting Diagnosis ED Disposition    ED Disposition Condition [facility]: [facility] [100100]  Level of Care: Med-Surg [16]  Diagnosis: Pneumonia [476546]  Admitting Physician: Barb Merino [[id number]]  Attending Physician: Barb Merino [[id number]]  Estimated length of stay: past midnight tomorrow  Certification:: I certify this patient will need inpatient services for at least 2 midnights  PT Class (Do Not Modify): Inpatient [101]  PT Acc Code (Do Not Modify): Private [1]       Medical History Past Medical History:  Diagnosis Date  . Arthritis   . Asthma   . Depression   . Diabetes mellitus without complication ([place])    Type II  . GERD (gastroesophageal reflux disease)   . Hypertension   . Renal disorder    CKD 5    Allergies No Known Allergies  IV Location/Drains/Wounds Patient Lines/Drains/Airways Status   Active Line/Drains/Airways    Name:   Placement date:   Placement time:   Site:   Days:   Peripheral IV 09/07/18 Right Antecubital   09/07/18    -    Antecubital   less than 1   Fistula / Graft Left Forearm Arteriovenous vein graft   06/19/18    1047    Forearm   80   Incision (Closed) 06/19/18 Arm Left   06/19/18    1047     80          Labs/Imaging Results for orders placed or performed during the hospital encounter of 09/07/18 (from the past 48 hour(s))  CBC     Status: Abnormal   Collection Time: 09/07/18  8:05 AM  Result Value Ref Range   WBC 14.5 (H) 4.0 - 10.5 K/uL   RBC 3.37 (L) 3.87 - 5.11 MIL/uL   Hemoglobin 9.1 (L) 12.0 - 15.0 g/dL   HCT 30.7 (L) 36.0 - 46.0 %   MCV 91.1 80.0 - 100.0 fL   MCH 27.0 26.0 - 34.0 pg   MCHC 29.6 (L) 30.0 - 36.0 g/dL   RDW 14.0 11.5 - 15.5 %   Platelets 337 150 - 400 K/uL   nRBC 0.0 0.0 - 0.2 %    Comment: Performed  at [facility], [place] [address], [place], [place] [address]  Basic metabolic panel     Status: Abnormal   Collection Time: 09/07/18  8:05 AM  Result Value Ref Range   Sodium 142 135 - 145 mmol/L   Potassium 4.1 3.5 - 5.1 mmol/L   Chloride 110 98 - 111 mmol/L   CO2 21 (L) 22 - 32 mmol/L   Glucose, Bld 179 (H) 70 - 99 mg/dL   BUN 37 (H) 8 - 23 mg/dL   Creatinine, Ser 4.16 (H) 0.44 - 1.00 mg/dL   Calcium 8.5 (L) 8.9 - 10.3 mg/dL   GFR calc non Af Amer 10 (L) >60 mL/min   GFR calc Af Amer 12 (L) >60 mL/min   Anion gap 11 5 - 15    Comment: Performed at [facility] [address], [place], [place] [address]  I-stat troponin, ED     Status: None   Collection Time: 09/07/18  8:10 AM  Result Value Ref Range   Troponin i, poc 0.01 0.00 - 0.08 ng/mL   Comment 3  Comment: Due to the release kinetics of cTnI, a negative result within the first hours of the onset of symptoms does not rule out myocardial infarction with certainty. If myocardial infarction is still suspected, repeat the test at appropriate intervals.   Influenza panel by PCR (type A & B)     Status: None   Collection Time: 09/07/18  9:10 AM  Result Value Ref Range   Influenza A By PCR NEGATIVE NEGATIVE   Influenza B By PCR NEGATIVE NEGATIVE    Comment: (NOTE) The Xpert Xpress Flu assay is intended as an aid in the diagnosis of  influenza and should not be used as a sole basis for treatment.  This  assay is FDA approved for nasopharyngeal swab specimens only. Nasal  washings and aspirates are unacceptable for Xpert Xpress Flu testing. Performed at [facility], [place] [address] [place], [place] [address]   I-Stat CG4 Lactic Acid, ED     Status: Abnormal   Collection Time: 09/07/18  9:33 AM  Result Value Ref Range   Lactic Acid, Venous 2.11 (HH) 0.5 - 1.9 mmol/L   Comment NOTIFIED PHYSICIAN   I-Stat CG4 Lactic Acid, ED     Status: Abnormal   Collection Time: 09/07/18 11:30 AM  Result Value  Ref Range   Lactic Acid, Venous 2.56 (HH) 0.5 - 1.9 mmol/L   Comment NOTIFIED PHYSICIAN    Dg Chest 2 View  Result Date: 09/07/2018 CLINICAL DATA:  Shortness of breath and productive cough. EXAM: CHEST - 2 VIEW COMPARISON:  06/10/2018 FINDINGS: The patient has new bilateral upper lobe infiltrates and right middle or lower lobe infiltrate. Mild chronic cardiomegaly. Pulmonary vascularity is normal. No effusions. No acute bone abnormality. IMPRESSION: Bilateral pneumonia. Electronically Signed   By: Lorriane Shire M.D.   On: 09/07/2018 08:56    Pending Labs Unresulted Labs (From admission, onward)    Start     Ordered   09/07/18 0910  Blood Culture (routine x 2)  BLOOD CULTURE X 2,   STAT     09/07/18 0910   Signed and Held  Culture, sputum-assessment  Once,   R     Signed and Held   Signed and Held  Gram stain  Once,   R     Signed and Held   Signed and Held  Strep pneumoniae urinary antigen  Once,   R     Signed and Held   Signed and Held  Legionella Pneumophila Serogp 1 Ur Ag  Once,   R     Signed and Held   Visual merchandiser and Held  Basic metabolic panel  Tomorrow morning,   R     Signed and Held   Signed and Held  CBC WITH DIFFERENTIAL  Tomorrow morning,   R     Signed and Held          Vitals/Pain Today's Vitals   09/07/18 1045 09/07/18 1045 09/07/18 1115 09/07/18 1200  BP: (!) 173/73  (!) 164/72 (!) 160/83  Pulse: 91  95 93  Resp: 20  17 17   Temp:      TempSrc:      SpO2: 93%  91% 98%  Weight:      Height:      PainSc:  0-No pain      Isolation Precautions Droplet precaution  Medications Medications  albuterol (PROVENTIL) (2.5 MG/3ML) 0.083% nebulizer solution 5 mg (5 mg Nebulization Not Given 09/07/18 0813)  cefTRIAXone (ROCEPHIN) 2 g in sodium chloride  0.9 % 100 mL IVPB (0 g Intravenous Stopped 09/07/18 1020)  azithromycin (ZITHROMAX) 500 mg in sodium chloride 0.9 % 250 mL IVPB (0 mg Intravenous Stopped 09/07/18 1209)  sodium chloride 0.9 % bolus 500 mL (has no  administration in time range)  albuterol (PROVENTIL) (2.5 MG/3ML) 0.083% nebulizer solution 5 mg (5 mg Nebulization Given 09/07/18 0828)  ipratropium (ATROVENT) nebulizer solution 0.5 mg (0.5 mg Nebulization Given 09/07/18 0827)  albuterol (PROVENTIL) (2.5 MG/3ML) 0.083% nebulizer solution 5 mg (5 mg Nebulization Given 09/07/18 0952)  sodium chloride 0.9 % bolus 500 mL (0 mLs Intravenous Stopped 09/07/18 1047)    Mobility walks

## 2018-09-08 DIAGNOSIS — I10 Essential (primary) hypertension: Secondary | ICD-10-CM

## 2018-09-08 DIAGNOSIS — E1122 Type 2 diabetes mellitus with diabetic chronic kidney disease: Secondary | ICD-10-CM

## 2018-09-08 DIAGNOSIS — I214 Non-ST elevation (NSTEMI) myocardial infarction: Secondary | ICD-10-CM

## 2018-09-08 DIAGNOSIS — Z794 Long term (current) use of insulin: Secondary | ICD-10-CM

## 2018-09-08 DIAGNOSIS — J4520 Mild intermittent asthma, uncomplicated: Secondary | ICD-10-CM

## 2018-09-08 LAB — CBC WITH DIFFERENTIAL/PLATELET
Abs Immature Granulocytes: 0.09 10*3/uL — ABNORMAL HIGH (ref 0.00–0.07)
Basophils Absolute: 0 10*3/uL (ref 0.0–0.1)
Basophils Relative: 0 %
Eosinophils Absolute: 0 10*3/uL (ref 0.0–0.5)
Eosinophils Relative: 0 %
HCT: 25.9 % — ABNORMAL LOW (ref 36.0–46.0)
HEMOGLOBIN: 7.9 g/dL — AB (ref 12.0–15.0)
Immature Granulocytes: 1 %
LYMPHS PCT: 5 %
Lymphs Abs: 0.9 10*3/uL (ref 0.7–4.0)
MCH: 26.9 pg (ref 26.0–34.0)
MCHC: 30.5 g/dL (ref 30.0–36.0)
MCV: 88.1 fL (ref 80.0–100.0)
Monocytes Absolute: 0.9 10*3/uL (ref 0.1–1.0)
Monocytes Relative: 5 %
Neutro Abs: 16.2 10*3/uL — ABNORMAL HIGH (ref 1.7–7.7)
Neutrophils Relative %: 89 %
Platelets: 313 10*3/uL (ref 150–400)
RBC: 2.94 MIL/uL — ABNORMAL LOW (ref 3.87–5.11)
RDW: 14 % (ref 11.5–15.5)
WBC: 18.1 10*3/uL — ABNORMAL HIGH (ref 4.0–10.5)
nRBC: 0 % (ref 0.0–0.2)

## 2018-09-08 LAB — PROCALCITONIN: Procalcitonin: 0.13 ng/mL

## 2018-09-08 LAB — BASIC METABOLIC PANEL
Anion gap: 10 (ref 5–15)
BUN: 49 mg/dL — ABNORMAL HIGH (ref 8–23)
CO2: 20 mmol/L — ABNORMAL LOW (ref 22–32)
Calcium: 8.7 mg/dL — ABNORMAL LOW (ref 8.9–10.3)
Chloride: 111 mmol/L (ref 98–111)
Creatinine, Ser: 4.29 mg/dL — ABNORMAL HIGH (ref 0.44–1.00)
GFR calc Af Amer: 11 mL/min — ABNORMAL LOW (ref >60)
GFR calc non Af Amer: 10 mL/min — ABNORMAL LOW (ref >60)
Glucose, Bld: 84 mg/dL (ref 70–99)
Potassium: 4 mmol/L (ref 3.5–5.1)
Sodium: 141 mmol/L (ref 135–145)

## 2018-09-08 LAB — TROPONIN I
Troponin I: 1.19 ng/mL (ref <0.03)
Troponin I: 1.28 ng/mL (ref <0.03)

## 2018-09-08 LAB — GLUCOSE, CAPILLARY
GLUCOSE-CAPILLARY: 62 mg/dL — AB (ref 70–99)
Glucose-Capillary: 48 mg/dL — ABNORMAL LOW (ref 70–99)
Glucose-Capillary: 72 mg/dL (ref 70–99)
Glucose-Capillary: 91 mg/dL (ref 70–99)
Glucose-Capillary: 101 mg/dL — ABNORMAL HIGH (ref 70–99)

## 2018-09-08 LAB — PREPARE RBC (CROSSMATCH)

## 2018-09-08 LAB — MAGNESIUM: Magnesium: 2 mg/dL (ref 1.7–2.4)

## 2018-09-08 MED ORDER — GABAPENTIN 100 MG PO CAPS
200.0000 mg | ORAL_CAPSULE | Freq: Three times a day (TID) | ORAL | Status: DC
  Administered 2018-09-08 – 2018-09-10 (×8): 200 mg via ORAL
  Filled 2018-09-08 (×8): qty 2

## 2018-09-08 MED ORDER — ROSUVASTATIN CALCIUM 20 MG PO TABS
40.0000 mg | ORAL_TABLET | Freq: Every day | ORAL | Status: DC
  Administered 2018-09-08 – 2018-09-11 (×4): 40 mg via ORAL
  Filled 2018-09-08 (×4): qty 2

## 2018-09-08 MED ORDER — ACETAMINOPHEN 325 MG PO TABS
650.0000 mg | ORAL_TABLET | Freq: Once | ORAL | Status: AC
  Administered 2018-09-08: 650 mg via ORAL
  Filled 2018-09-08: qty 2

## 2018-09-08 MED ORDER — LEVALBUTEROL HCL 0.63 MG/3ML IN NEBU
0.6300 mg | INHALATION_SOLUTION | Freq: Four times a day (QID) | RESPIRATORY_TRACT | Status: DC | PRN
  Administered 2018-09-08: 0.63 mg via RESPIRATORY_TRACT
  Filled 2018-09-08: qty 3

## 2018-09-08 MED ORDER — METOPROLOL TARTRATE 50 MG PO TABS: 50.0000 mg | ORAL_TABLET | Freq: Two times a day (BID) | ORAL | Status: DC

## 2018-09-08 MED ORDER — SODIUM CHLORIDE 0.9% IV SOLUTION
Freq: Once | INTRAVENOUS | Status: AC
  Administered 2018-09-08: 18:00:00 via INTRAVENOUS

## 2018-09-08 MED ORDER — DIPHENHYDRAMINE HCL 50 MG/ML IJ SOLN
25.0000 mg | Freq: Once | INTRAMUSCULAR | Status: AC
  Administered 2018-09-08: 25 mg via INTRAVENOUS
  Filled 2018-09-08: qty 1

## 2018-09-08 MED ORDER — CARVEDILOL 12.5 MG PO TABS
12.5000 mg | ORAL_TABLET | Freq: Two times a day (BID) | ORAL | Status: DC
  Administered 2018-09-08 – 2018-09-12 (×8): 12.5 mg via ORAL
  Filled 2018-09-08 (×8): qty 1

## 2018-09-08 MED ORDER — IPRATROPIUM BROMIDE 0.02 % IN SOLN: 0.5000 mg | Freq: Four times a day (QID) | RESPIRATORY_TRACT | Status: DC | PRN

## 2018-09-08 MED ORDER — HEPARIN (PORCINE) 25000 UT/250ML-% IV SOLN
1200.0000 [IU]/h | INTRAVENOUS | Status: AC
  Administered 2018-09-08: 1100 [IU]/h via INTRAVENOUS
  Administered 2018-09-09 – 2018-09-10 (×2): 1200 [IU]/h via INTRAVENOUS
  Filled 2018-09-08 (×3): qty 250

## 2018-09-08 MED ORDER — FUROSEMIDE 10 MG/ML IJ SOLN
120.0000 mg | Freq: Two times a day (BID) | INTRAMUSCULAR | Status: DC
  Administered 2018-09-08 – 2018-09-09 (×3): 120 mg via INTRAVENOUS
  Filled 2018-09-08 (×3): qty 12

## 2018-09-08 MED ORDER — FAMOTIDINE 20 MG PO TABS
20.0000 mg | ORAL_TABLET | Freq: Once | ORAL | Status: AC
  Administered 2018-09-08: 20 mg via ORAL
  Filled 2018-09-08: qty 1

## 2018-09-08 MED ORDER — NITROGLYCERIN 0.4 MG SL SUBL
0.4000 mg | SUBLINGUAL_TABLET | SUBLINGUAL | Status: DC | PRN
  Filled 2018-09-08: qty 1

## 2018-09-08 MED ORDER — ASPIRIN EC 325 MG PO TBEC
325.0000 mg | DELAYED_RELEASE_TABLET | Freq: Every day | ORAL | Status: DC
  Administered 2018-09-09: 325 mg via ORAL
  Filled 2018-09-08: qty 1

## 2018-09-08 MED ORDER — INSULIN DETEMIR 100 UNIT/ML ~~LOC~~ SOLN
35.0000 [IU] | Freq: Two times a day (BID) | SUBCUTANEOUS | Status: DC
  Administered 2018-09-08: 35 [IU] via SUBCUTANEOUS
  Filled 2018-09-08 (×2): qty 0.35

## 2018-09-08 NOTE — Consult Note (Signed)
Cardiology Consult Note   Primary Physician: Kristen Loader, FNP PCP-Cardiologist:  No primary care provider on file.  Reason for Consultation: Elevated troponin  HPI:    Priscilla Davis is seen today for evaluation of elevated troponin at the request of Dr. Sloan Leiter, hospitalist.     This afternoon she developed substernal chest discomfort.  This felt like a preShe is 68 y.o. female with history of insulin-dependent DM-2, stage IV CKD, asthma-presented with 2-day history of cough, and worsening shortness of breath.  Chest x-ray positive for bilateral pneumonia-she was started on antibiotics and admitted to the hospitalist service.  ssure in the center of her chest without radiation.  Is associated with dyspnea.  The discomfort improved after receiving nitroglycerin and IV Lasix.  She currently does denies feeling any chest pain or chest discomfort at the time of evaluation.  She is also been started on a heparin drip.  She denies any bleeding.  Troponin was checked and is elevated at 1.19.  ECG demonstrates normal sinus rhythm with incomplete left bundle branch block without clear evidence of ischemia.  This ECG is similar to prior ECG in October 2019.  No recent echo.  Review of Systems: [y] = yes, [ ]  = no   General: Weight gain [ ] ; Weight loss [ ] ; Anorexia [ ] ; Fatigue [ ] ; Fever [ ] ; Chills [ ] ; Weakness [ ]   Cardiac: Chest pain/pressure Blue.Reese ]; Resting SOB Blue.Reese ]; Exertional SOB [ y]; Orthopnea [ ] ; Pedal Edema [ ] ; Palpitations [ ] ; Syncope [ ] ; Presyncope [ ] ; Paroxysmal nocturnal dyspnea[ ]   Pulmonary: Cough [ ] ; Pryor Montes ]; Hemoptysis[ ] ; Sputum [ ] ; Snoring [ ]   GI: Vomiting[ ] ; Dysphagia[ ] ; Melena[ ] ; Hematochezia [ ] ; Heartburn[ ] ; Abdominal pain [ ] ; Constipation [ ] ; Diarrhea [ ] ; BRBPR [ ]   GU: Hematuria[ ] ; Dysuria [ ] ; Nocturia[ ]   Vascular: Pain in legs with walking [ ] ; Pain in feet with lying flat [ ] ; Non-healing sores [ ] ; Stroke [ ] ; TIA [ ] ; Slurred speech [ ] ;    Neuro: Headaches[ ] ; Vertigo[ ] ; Seizures[ ] ; Paresthesias[ ] ;Blurred vision [ ] ; Diplopia [ ] ; Vision changes [ ]   Ortho/Skin: Arthritis [ ] ; Joint pain [ ] ; Muscle pain [ ] ; Joint swelling [ ] ; Back Pain [ ] ; Rash [ ]   Psych: Depression[ ] ; Anxiety[ ]   Heme: Bleeding problems [ ] ; Clotting disorders [ ] ; Anemia [ ]   Endocrine: Diabetes [ ] ; Thyroid dysfunction[ ]   Home Medications Prior to Admission medications   Medication Sig Start Date End Date Taking? Authorizing Provider  Acetaminophen 500 MG coapsule Take 1,000 mg by mouth every 6 (six) hours as needed for mild pain or moderate pain.    Yes [provider]  albuterol (PROVENTIL HFA;VENTOLIN HFA) 108 (90 Base) MCG/ACT inhaler Inhale 2 puffs into the lungs every 6 (six) hours as needed for wheezing or shortness of breath.   Yes [provider]  amLODipine (NORVASC) 10 MG tablet Take 10 mg by mouth daily.   Yes [provider]  aspirin EC 81 MG tablet Take 81 mg by mouth daily.   Yes [provider]  Cholecalciferol (VITAMIN D) 125 MCG (5000 UT) CAPS Take 5,000 Units by mouth daily.    Yes [provider]  dorzolamide (TRUSOPT) 2 % ophthalmic solution Place 1 drop into both eyes 2 (two) times daily.   Yes [provider]  FLUoxetine (PROZAC) 20 MG capsule Take 20 mg  by mouth daily. 05/22/17  Yes [provider]  gabapentin (NEURONTIN) 400 MG capsule Take 400 mg by mouth 3 (three) times daily.    Yes [provider]  insulin detemir (LEVEMIR) 100 UNIT/ML injection Inject 48 Units into the skin 2 (two) times daily.    Yes [provider]  latanoprost (XALATAN) 0.005 % ophthalmic solution Place 1 drop into both eyes at bedtime.   Yes [provider]  liraglutide (VICTOZA) 18 MG/3ML SOPN Inject 1.8 mg into the skin every evening.    Yes [provider]  losartan (COZAAR) 25 MG tablet Take 25 mg by mouth daily.   Yes [provider]   metoprolol tartrate (LOPRESSOR) 25 MG tablet Take 25 mg by mouth 2 (two) times daily.   Yes [provider]  Multiple Vitamin (MULTIVITAMIN WITH MINERALS) TABS tablet Take 1 tablet by mouth daily. woman's   Yes [provider]  omeprazole (PRILOSEC) 20 MG capsule Take 40 mg by mouth daily.    Yes [provider]  ranitidine (ZANTAC) 150 MG tablet Take 150 mg by mouth daily.   Yes [provider]  Rosuvastatin Calcium 40 MG CPSP Take 40 mg by mouth at bedtime.    Yes [provider]  vitamin B-12 (CYANOCOBALAMIN) 1000 MCG tablet Take 1,000 mcg by mouth daily.   Yes [provider]    Past Medical History: Past Medical History:  Diagnosis Date  . Arthritis   . Asthma   . Depression   . Diabetes mellitus without complication ([place])    Type II  . GERD (gastroesophageal reflux disease)   . Hypertension   . Renal disorder    CKD 5    Past Surgical History: Past Surgical History:  Procedure Laterality Date  . AV FISTULA PLACEMENT Left 06/19/2018   Procedure: INSERTION OF 4-7MM X 45CM ARTERIOVENOUS (AV) GORE-TEX GRAFT LEFT  ARM;  Surgeon: Elam Dutch, MD;  Location: [facility];  Service: Vascular;  Laterality: Left;  . CESAREAN SECTION    . CHOLECYSTECTOMY    . COLONOSCOPY    . EYE SURGERY Bilateral    diabetic   . 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 Peripheral vascular disease Mother   . Hypertension Mother     Social History: Social History   Socioeconomic History  . Marital status: Widowed    Spouse name: Not on file  . Number of children: Not on file  . Years of education: Not on file  . Highest education level: Not on file  Occupational History  . Not on file  Social Needs  . Financial resource strain: Not on file  . Food insecurity:    Worry: Not on file    Inability: Not on file  . Transportation needs:    Medical: Not on file     Non-medical: Not on file  Tobacco Use  . Smoking status: Current Every Day Smoker    Packs/day: 0.25    Years: 30.00    Pack years: 7.50    Types: Cigarettes  . Smokeless tobacco: Never Used  Substance and Sexual Activity  . Alcohol use: Yes    Comment: seldom  . Drug use: Never  . Sexual activity: Not on file  Lifestyle  . Physical activity:    Days per week: Not on file    Minutes per session: Not on file  . Stress: Not  on file  Relationships  . Social connections:    Talks on phone: Not on file    Gets together: Not on file    Attends religious service: Not on file    Active member of club or organization: Not on file    Attends meetings of clubs or organizations: Not on file    Relationship status: Not on file  Other Topics Concern  . Not on file  Social History Narrative  . Not on file    Allergies:  No Known Allergies  Objective:    Vital Signs:   Temp:  [98.3 F (36.8 C)-99.6 F (37.6 C)] 98.9 F (37.2 C) (01/11 1539) Pulse Rate:  [78-81] 78 (01/11 1539) Resp:  [18-19] 18 (01/11 1539) BP: (162-174)/(67-76) 162/67 (01/11 1539) SpO2:  [90 %-100 %] 93 % (01/11 1539) Last BM Date: 09/08/18  Weight change: Filed Weights   09/07/18 0750  Weight: 99.8 kg    Intake/Output:   Intake/Output Summary (Last 24 hours) at 09/08/2018 2013 Last data filed at 09/08/2018 1926 Gross per 24 hour  Intake 111.44 ml  Output 650 ml  Net -538.56 ml      Physical Exam    General appearance: Awake, alert, mildly labored breathing HEENT: Atraumatic and Normocephalic Neck: supple Resp:Good air entry bilaterally, bibasilar rales CVS: S1 S2 regular, no murmurs.  GI: Bowel sounds present, Non tender and not distended with no gaurding, rigidity or rebound.No organomegaly Extremities: B/L Lower Ext shows no edema, both legs are warm to touch Neurology:  speech clear,Non focal, sensation is grossly intact. Psychiatric: Normal judgment and insight. Alert and oriented x 3.  Normal mood. Musculoskeletal:No digital cyanosis Skin:No Rash, warm and dry  Telemetry   Sinus rhythm  EKG    Sinus rhythm with incomplete left bundle branch block.  No evidence of ischemia.  Labs   Basic Metabolic Panel: Recent Labs  Lab 09/07/18 0805 09/08/18 0353  NA 142 141  K 4.1 4.0  CL 110 111  CO2 21* 20*  GLUCOSE 179* 84  BUN 37* 49*  CREATININE 4.16* 4.29*  CALCIUM 8.5* 8.7*  MG  --  2.0    Liver Function Tests: No results for input(s): AST, ALT, ALKPHOS, BILITOT, PROT, ALBUMIN in the last 168 hours. No results for input(s): LIPASE, AMYLASE in the last 168 hours. No results for input(s): AMMONIA in the last 168 hours.  CBC: Recent Labs  Lab 09/07/18 0805 09/08/18 0353  WBC 14.5* 18.1*  NEUTROABS  --  16.2*  HGB 9.1* 7.9*  HCT 30.7* 25.9*  MCV 91.1 88.1  PLT 337 313    Cardiac Enzymes: Recent Labs  Lab 09/08/18 1615  TROPONINI 1.19*    BNP: BNP (last 3 results) No results for input(s): BNP in the last 8760 hours.  ProBNP (last 3 results) No results for input(s): PROBNP in the last 8760 hours.   CBG: Recent Labs  Lab 09/07/18 2238 09/08/18 0850 09/08/18 0917 09/08/18 1311 09/08/18 1805  GLUCAP 227* 48* 72 62* 91    Coagulation Studies: No results for input(s): LABPROT, INR in the last 72 hours.   Imaging    No results found.   Medications:     Current Medications: . albuterol  5 mg Nebulization Once  . amLODipine  10 mg Oral Daily  . [START ON 09/09/2018] aspirin EC  325 mg Oral Daily  . azithromycin  250 mg Oral Daily  . cholecalciferol  5,000 Units Oral Daily  . dorzolamide  1 drop  Both Eyes BID  . furosemide  120 mg Intravenous BID  . gabapentin  200 mg Oral TID  . insulin aspart  0-9 Units Subcutaneous TID WC  . insulin detemir  35 Units Subcutaneous BID  . latanoprost  1 drop Both Eyes QHS  . metoprolol tartrate  50 mg Oral BID  . multivitamin with minerals  1 tablet Oral Daily  . pantoprazole  40 mg Oral  Daily  . rosuvastatin  40 mg Oral QHS  . vitamin B-12  1,000 mcg Oral Daily     Infusions: . cefTRIAXone (ROCEPHIN)  IV 1 g (09/08/18 1303)  . heparin 1,100 Units/hr (09/08/18 1823)  . sodium chloride        Assessment/Plan  68 y.o. female with history of insulin-dependent DM-2, stage IV CKD, asthma-presented with 2-day history of cough, and worsening shortness of breath.  Cardiology consult for chest pain and NSTEMI this afternoon. Patient also with hypertension.  Agree that patient has some evidence of volume overload and pulmonary edema that is likely contributing.  Agree with Lasix IV.  Agree with nitroglycerin as needed.  She is feeling better now.  Continue the patient on aspirin, rosuvastatin 40, and heparin drip.  Patient's hemoglobin is decreased however no sign of gross bleeding.  Agree with 1 unit packed red blood cells and monitor CBC.  Can change metoprolol 50 to carvedilol 12.5 twice daily.  Consider nephrology consult for additional assistance with hypertensive management in this CKD patient. Check echocardiogram.  Continue to trend troponin until peak. We will consider coronary angiography based on the patient's clinical course although not emergent at this time.  Thank you very much for the consultation  Length of Stay: 1  Tressie Stalker, MD  09/08/2018, 8:13 PM   Please contact [facility] for night-coverage after hours (4p -7a ) and weekends on [email or web address]

## 2018-09-08 NOTE — Plan of Care (Signed)

## 2018-09-08 NOTE — Progress Notes (Signed)
Hypoglycemic Event  CBG: 48 @ 8:50  Treatment: 8 oz juice/soda  Symptoms: None  Follow-up CBG: Time:9:17 CBG Result:72  Possible Reasons for Event: Unknown  Comments/MD notified:Given another orange juice 4oz. Holding Levemir for now. Will give closer to lunch   [place]. Tamala Julian, MSN RN

## 2018-09-08 NOTE — Plan of Care (Signed)
  Problem: Education: Goal: Knowledge of General Education information will improve Description Including pain rating scale, medication(s)/side effects and non-pharmacologic comfort measures Outcome: Progressing   

## 2018-09-08 NOTE — Progress Notes (Signed)
PROGRESS NOTE        PATIENT DETAILS Name: Priscilla Davis Age: 68 y.o. Sex: female Date of Birth: 02/12/1951 Admit Date: 09/07/2018 Admitting Physician Barb Merino, MD QQI:WLNLG, Beryle Lathe, FNP  Brief Narrative: Patient is a 68 y.o. female with history of insulin-dependent DM-2, stage IV CKD, asthma-presented with 2-day history of cough, and worsening shortness of breath.  Chest x-ray positive for bilateral pneumonia-she was started on antibiotics and admitted to the hospitalist service.    Subjective: Not much better-still short of breath with minimal exertion (just walking to the bathroom).  Denies fever.  Assessment/Plan: Acute respiratory distress likely secondary to bilateral pneumonia: Still short of breath with minimal exertion-not much improvement following initiation of IV antimicrobial therapy.  No fever-but has significant leukocytosis.  No obvious signs of volume overload-but does have worsening CKD-and in anticipation of initiating dialysis-had a left arm AV fistula placed a few months back.  Although she probably has pneumonia-but given chest x-ray appearance-and worsening renal function-not unreasonable to try intravenous Lasix to see if she has a component of volume overload causing her symptoms.  Will check procalcitonin.  Start Lasix 120 mg IV twice daily-and continue IV antibiotics.  Influenza PCR negative, blood cultures negative so far.  Will reassess tomorrow.  CKD stage IV: Seems to have progressive renal failure-her creatinine is worse compared to her most recent levels.  She follows with [facility]-and has had a left AV fistula placed in anticipation of starting HD soon.  See above.  If renal function worsens-or develops other electrolyte abnormalities-we will officially consult nephrology  Insulin-dependent DM-2 hypoglycemia: Developed hypoglycemia earlier this morning-decrease Levemir to 35 units twice daily-official continue with SSI.   Follow and adjust accordingly.  Hypertension: Uncontrolled-continue amlodipine-increase metoprolol to 50 mg twice daily-adding Lasix.  Follow and adjust accordingly  Dyslipidemia: Continue statin  Asthma: No signs of exacerbation-continue with as needed bronchodilators  Slightly prolonged QTC: Check magnesium levels-hold Prozac for now.  Repeat twelve-lead EKG in a.m.  Tobacco abuse: Counseled  DVT Prophylaxis: Prophylactic heparin  Code Status: Full code   Family Communication: None at bedside  Disposition Plan Remain inpatient-not stable for discharge at this time.  Requires several more days of hospitalization  Antimicrobial agents: Anti-infectives (From admission, onward)   Start     Dose/Rate Route Frequency Ordered Stop   09/08/18 1000  cefTRIAXone (ROCEPHIN) 1 g in sodium chloride 0.9 % 100 mL IVPB     1 g 200 mL/hr over 30 Minutes Intravenous Every 24 hours 09/07/18 1517     09/08/18 1000  azithromycin (ZITHROMAX) tablet 250 mg     250 mg Oral Daily 09/07/18 1517 09/12/18 0959   09/07/18 0915  cefTRIAXone (ROCEPHIN) 2 g in sodium chloride 0.9 % 100 mL IVPB  Status:  Discontinued     2 g 200 mL/hr over 30 Minutes Intravenous Every 24 hours 09/07/18 0910 09/07/18 1517   09/07/18 0915  azithromycin (ZITHROMAX) 500 mg in sodium chloride 0.9 % 250 mL IVPB  Status:  Discontinued     500 mg 250 mL/hr over 60 Minutes Intravenous Every 24 hours 09/07/18 0910 09/07/18 1517      Procedures: None  CONSULTS:  None  Time spent: 35 minutes-Greater than 50% of this time was spent in counseling, explanation of diagnosis, planning of further management, and coordination of care.  MEDICATIONS: Scheduled Meds: .  albuterol  5 mg Nebulization Once  . amLODipine  10 mg Oral Daily  . aspirin EC  81 mg Oral Daily  . azithromycin  250 mg Oral Daily  . cholecalciferol  5,000 Units Oral Daily  . dorzolamide  1 drop Both Eyes BID  . FLUoxetine  20 mg Oral Daily  . gabapentin   400 mg Oral TID  . heparin  5,000 Units Subcutaneous Q8H  . insulin aspart  0-5 Units Subcutaneous QHS  . insulin aspart  0-9 Units Subcutaneous TID WC  . insulin detemir  48 Units Subcutaneous BID  . latanoprost  1 drop Both Eyes QHS  . liraglutide  1.8 mg Subcutaneous QPM  . metoprolol tartrate  25 mg Oral BID  . multivitamin with minerals  1 tablet Oral Daily  . pantoprazole  40 mg Oral Daily  . rosuvastatin  40 mg Oral QHS  . vitamin B-12  1,000 mcg Oral Daily   Continuous Infusions: . cefTRIAXone (ROCEPHIN)  IV    . sodium chloride     PRN Meds:.acetaminophen, albuterol   PHYSICAL EXAM: Vital signs: Vitals:   09/07/18 2116 09/08/18 0502 09/08/18 0556 09/08/18 0600  BP: (!) 174/76 (!) 166/69    Pulse: 81 79    Resp: 18 19    Temp: 99.6 F (37.6 C) 98.3 F (36.8 C)    TempSrc: Oral Oral    SpO2:  100% 90% 96%  Weight:      Height:       Filed Weights   09/07/18 0750  Weight: 99.8 kg   Body mass index is 33.45 kg/m.   General appearance :Awake, alert, not in any distress.  HEENT: Atraumatic and Normocephalic Neck: supple Resp:Good air entry bilaterally, bibasilar rales CVS: S1 S2 regular, no murmurs.  GI: Bowel sounds present, Non tender and not distended with no gaurding, rigidity or rebound.No organomegaly Extremities: B/L Lower Ext shows no edema, both legs are warm to touch Neurology:  speech clear,Non focal, sensation is grossly intact. Psychiatric: Normal judgment and insight. Alert and oriented x 3. Normal mood. Musculoskeletal:No digital cyanosis Skin:No Rash, warm and dry Wounds:N/A  I have personally reviewed following labs and imaging studies  LABORATORY DATA: CBC: Recent Labs  Lab 09/07/18 0805 09/08/18 0353  WBC 14.5* 18.1*  NEUTROABS  --  16.2*  HGB 9.1* 7.9*  HCT 30.7* 25.9*  MCV 91.1 88.1  PLT 337 458    Basic Metabolic Panel: Recent Labs  Lab 09/07/18 0805 09/08/18 0353  NA 142 141  K 4.1 4.0  CL 110 111  CO2 21* 20*    GLUCOSE 179* 84  BUN 37* 49*  CREATININE 4.16* 4.29*  CALCIUM 8.5* 8.7*    GFR: Estimated Creatinine Clearance: 15.3 mL/min (A) (by C-G formula based on SCr of 4.29 mg/dL (H)).  Liver Function Tests: No results for input(s): AST, ALT, ALKPHOS, BILITOT, PROT, ALBUMIN in the last 168 hours. No results for input(s): LIPASE, AMYLASE in the last 168 hours. No results for input(s): AMMONIA in the last 168 hours.  Coagulation Profile: No results for input(s): INR, PROTIME in the last 168 hours.  Cardiac Enzymes: No results for input(s): CKTOTAL, CKMB, CKMBINDEX, TROPONINI in the last 168 hours.  BNP (last 3 results) No results for input(s): PROBNP in the last 8760 hours.  HbA1C: No results for input(s): HGBA1C in the last 72 hours.  CBG: Recent Labs  Lab 09/07/18 1544 09/07/18 2238 09/08/18 0850 09/08/18 0917  GLUCAP 336* 227* 48* 72  Lipid Profile: No results for input(s): CHOL, HDL, LDLCALC, TRIG, CHOLHDL, LDLDIRECT in the last 72 hours.  Thyroid Function Tests: No results for input(s): TSH, T4TOTAL, FREET4, T3FREE, THYROIDAB in the last 72 hours.  Anemia Panel: No results for input(s): VITAMINB12, FOLATE, FERRITIN, TIBC, IRON, RETICCTPCT in the last 72 hours.  Urine analysis:    Component Value Date/Time   COLORURINE YELLOW 07/26/2017 1000   APPEARANCEUR HAZY (A) 07/26/2017 1000   LABSPEC 1.015 07/26/2017 1000   PHURINE 6.0 07/26/2017 1000   GLUCOSEU NEGATIVE 07/26/2017 1000   HGBUR NEGATIVE 07/26/2017 1000   BILIRUBINUR NEGATIVE 07/26/2017 1000   KETONESUR NEGATIVE 07/26/2017 1000   PROTEINUR 100 (A) 07/26/2017 1000   NITRITE NEGATIVE 07/26/2017 1000   LEUKOCYTESUR TRACE (A) 07/26/2017 1000    Sepsis Labs: Lactic Acid, Venous    Component Value Date/Time   LATICACIDVEN 2.56 (HH) 09/07/2018 1130    MICROBIOLOGY: Recent Results (from the past 240 hour(s))  Blood Culture (routine x 2)     Status: None (Preliminary result)   Collection Time:  09/07/18  9:10 AM  Result Value Ref Range Status   Specimen Description BLOOD BLOOD RIGHT HAND  Final   Special Requests   Final    BOTTLES DRAWN AEROBIC AND ANAEROBIC Blood Culture adequate volume   Culture   Final    NO GROWTH < 24 HOURS Performed at [facility], [place] [address] [place], [place] [address]    Report Status PENDING  Incomplete  Blood Culture (routine x 2)     Status: None (Preliminary result)   Collection Time: 09/07/18  9:15 AM  Result Value Ref Range Status   Specimen Description BLOOD BLOOD RIGHT HAND  Final   Special Requests   Final    BOTTLES DRAWN AEROBIC AND ANAEROBIC Blood Culture adequate volume   Culture   Final    NO GROWTH < 24 HOURS Performed at [facility], [place] [address] [place], [place] [address]    Report Status PENDING  Incomplete    RADIOLOGY STUDIES/RESULTS: Dg Chest 2 View  Result Date: 09/07/2018 CLINICAL DATA:  Shortness of breath and productive cough. EXAM: CHEST - 2 VIEW COMPARISON:  06/10/2018 FINDINGS: The patient has new bilateral upper lobe infiltrates and right middle or lower lobe infiltrate. Mild chronic cardiomegaly. Pulmonary vascularity is normal. No effusions. No acute bone abnormality. IMPRESSION: Bilateral pneumonia. Electronically Signed   By: Lorriane Shire M.D.   On: 09/07/2018 08:56     LOS: 1 day   Oren Binet, MD  [facility]  If 7PM-7AM, please contact night-coverage  Please page via [email or web address] [email or web address]-click on MD name and type text message  09/08/2018, 10:47 AM

## 2018-09-08 NOTE — Progress Notes (Signed)
Hypoglycemic Event  CBG: 62  Treatment: 4 oz juice/soda  Symptoms: None  Comments/MD notified:Patient is currently eating her lunch, juice given as well.    Cathlean Marseilles Tamala Julian, MSN RN

## 2018-09-08 NOTE — Progress Notes (Signed)
ANTICOAGULATION CONSULT NOTE - Initial Consult  Pharmacy Consult for Heparin Indication: chest pain/ACS  No Known Allergies  Patient Measurements: Height: 5\' 8"  (172.7 cm) Weight: 220 lb (99.8 kg) IBW/kg (Calculated) : 63.9 Heparin Dosing Weight: 85.8 kg  Vital Signs: Temp: 98.9 F (37.2 C) (01/11 1539) Temp Source: Oral (01/11 1539) BP: 162/67 (01/11 1539) Pulse Rate: 78 (01/11 1539)  Labs: Recent Labs    09/07/18 0805 09/08/18 0353 09/08/18 1615  HGB 9.1* 7.9*  --   HCT 30.7* 25.9*  --   PLT 337 313  --   CREATININE 4.16* 4.29*  --   TROPONINI  --   --  1.19*    Estimated Creatinine Clearance: 15.3 mL/min (A) (by C-G formula based on SCr of 4.29 mg/dL (H)).   Medical History: Past Medical History:  Diagnosis Date  . Arthritis   . Asthma   . Depression   . Diabetes mellitus without complication ([place])    Type II  . GERD (gastroesophageal reflux disease)   . Hypertension   . Renal disorder    CKD 5    Medications:  Infusions:  . cefTRIAXone (ROCEPHIN)  IV 1 g (09/08/18 1303)  . sodium chloride      Assessment: 83 YOF who presented on 1/10 with SOB/wheezing - now noted to have a bump in troponins concerning for ACS/CP. Pharmacy consulted to start Heparin for anticoagulation.   The patient was not noted to be on [facility] PTA however a recent AVF placement is noted in Oct '19. The patient did receive a dose of Heparin 5000 units SQ for VTE prophylaxis at 1305. Due to this - will hold off on the initial bolus.   Goal of Therapy:  Heparin level 0.3-0.7 units/ml Monitor platelets by anticoagulation protocol: Yes   Plan:  - Start Heparin at 1100 units/hr - Daily HL, CBC - Will continue to monitor for any signs/symptoms of bleeding and will follow up with heparin level in 8 hours   Thank you for allowing pharmacy to be a part of this patient's care.  Alycia Rossetti, PharmD, BCPS Clinical Pharmacist 09/08/2018 5:53 PM   **Pharmacist phone directory can now  be found on [email or web address] (PW [email or web address]).  Listed under [place].

## 2018-09-08 NOTE — Progress Notes (Signed)
Informed by RN approximately 45 minutes back that patient experiencing substernal chest pain.  EKG and troponins were ordered.  Troponins are significantly elevated.  EKG shows partial left bundle branch block.  Patient was subsequently seen and examined-chest pain has markedly improved-patient claims that it is "easing off".  Describes the pain as pressure-like sensation in the center of her chest-with no nausea vomiting.  She does have some shortness of breath and has started wheezing (was not wheezing earlier this morning)  On exam: Appears comfortable Chest: Moving air-bibasilar rales-coarse rhonchi with prolonged expiration CVS: S1-S2 regular Extremities: No edema  Labs: Troponin: 1.19 Hemoglobin: 7.9 earlier this morning  Impression: Non-STEMI/demand ischemia  Plan: Nitroglycerin sublingual x1 Start IV heparin-change to full dose aspirin, continue statin and beta-blocker Given that her hemoglobin is 7.9 and she is having chest pain-we will go and transfuse 1 unit of PRBC Cardiology consulted. Begin telemetry monitoring  Note-both patient and RN updated at bedside  Total time spent 45 minutes  Time in: 5:10 PM Time out: 5:55 pM

## 2018-09-08 NOTE — Progress Notes (Signed)
CRITICAL VALUE ALERT  Critical Value:  Troponin 1.19  Date & Time Notied:  09/08/18 1722  Provider Notified: Sloan Leiter   Orders Received/Actions taken:

## 2018-09-09 ENCOUNTER — Inpatient Hospital Stay (HOSPITAL_COMMUNITY): Payer: Medicare Other

## 2018-09-09 DIAGNOSIS — I10 Essential (primary) hypertension: Secondary | ICD-10-CM

## 2018-09-09 DIAGNOSIS — J181 Lobar pneumonia, unspecified organism: Secondary | ICD-10-CM

## 2018-09-09 DIAGNOSIS — N184 Chronic kidney disease, stage 4 (severe): Secondary | ICD-10-CM

## 2018-09-09 DIAGNOSIS — I248 Other forms of acute ischemic heart disease: Secondary | ICD-10-CM

## 2018-09-09 LAB — RENAL FUNCTION PANEL
ANION GAP: 11 (ref 5–15)
Albumin: 3 g/dL — ABNORMAL LOW (ref 3.5–5.0)
BUN: 57 mg/dL — ABNORMAL HIGH (ref 8–23)
CO2: 22 mmol/L (ref 22–32)
Calcium: 8.8 mg/dL — ABNORMAL LOW (ref 8.9–10.3)
Chloride: 109 mmol/L (ref 98–111)
Creatinine, Ser: 4.58 mg/dL — ABNORMAL HIGH (ref 0.44–1.00)
GFR calc Af Amer: 11 mL/min — ABNORMAL LOW (ref >60)
GFR, EST NON AFRICAN AMERICAN: 9 mL/min — AB (ref >60)
Glucose, Bld: 50 mg/dL — ABNORMAL LOW (ref 70–99)
Phosphorus: 4.3 mg/dL (ref 2.5–4.6)
Potassium: 4.4 mmol/L (ref 3.5–5.1)
Sodium: 142 mmol/L (ref 135–145)

## 2018-09-09 LAB — TYPE AND SCREEN
ABO/RH(D): A POS
Antibody Screen: NEGATIVE
Unit division: 0

## 2018-09-09 LAB — GLUCOSE, CAPILLARY
Glucose-Capillary: 35 mg/dL — CL (ref 70–99)
Glucose-Capillary: 56 mg/dL — ABNORMAL LOW (ref 70–99)
Glucose-Capillary: 72 mg/dL (ref 70–99)
Glucose-Capillary: 101 mg/dL — ABNORMAL HIGH (ref 70–99)
Glucose-Capillary: 95 mg/dL (ref 70–99)
Glucose-Capillary: 132 mg/dL — ABNORMAL HIGH (ref 70–99)
Glucose-Capillary: 165 mg/dL — ABNORMAL HIGH (ref 70–99)

## 2018-09-09 LAB — CBC
HCT: 31.1 % — ABNORMAL LOW (ref 36.0–46.0)
HEMOGLOBIN: 9.7 g/dL — AB (ref 12.0–15.0)
MCH: 28 pg (ref 26.0–34.0)
MCHC: 31.2 g/dL (ref 30.0–36.0)
MCV: 89.9 fL (ref 80.0–100.0)
Platelets: 351 10*3/uL (ref 150–400)
RBC: 3.46 MIL/uL — ABNORMAL LOW (ref 3.87–5.11)
RDW: 14 % (ref 11.5–15.5)
WBC: 20.1 10*3/uL — ABNORMAL HIGH (ref 4.0–10.5)
nRBC: 0 % (ref 0.0–0.2)

## 2018-09-09 LAB — ABO/RH: ABO/RH(D): A POS

## 2018-09-09 LAB — TROPONIN I: TROPONIN I: 1.27 ng/mL — AB (ref <0.03)

## 2018-09-09 LAB — BPAM RBC
Blood Product Expiration Date: 202002032359
ISSUE DATE / TIME: 202001112309
UNIT TYPE AND RH: 6200

## 2018-09-09 LAB — HEPARIN LEVEL (UNFRACTIONATED)
HEPARIN UNFRACTIONATED: 0.28 [IU]/mL — AB (ref 0.30–0.70)
Heparin Unfractionated: 0.61 IU/mL (ref 0.30–0.70)
Heparin Unfractionated: 0.66 IU/mL (ref 0.30–0.70)

## 2018-09-09 LAB — LEGIONELLA PNEUMOPHILA SEROGP 1 UR AG: L. pneumophila Serogp 1 Ur Ag: NEGATIVE

## 2018-09-09 LAB — MAGNESIUM: MAGNESIUM: 2 mg/dL (ref 1.7–2.4)

## 2018-09-09 MED ORDER — HYDRALAZINE HCL 50 MG PO TABS
50.0000 mg | ORAL_TABLET | Freq: Two times a day (BID) | ORAL | Status: DC
  Administered 2018-09-09 – 2018-09-12 (×7): 50 mg via ORAL
  Filled 2018-09-09 (×7): qty 1

## 2018-09-09 MED ORDER — INSULIN DETEMIR 100 UNIT/ML ~~LOC~~ SOLN
15.0000 [IU] | Freq: Every day | SUBCUTANEOUS | Status: DC
  Administered 2018-09-09 – 2018-09-11 (×3): 15 [IU] via SUBCUTANEOUS
  Filled 2018-09-09 (×3): qty 0.15

## 2018-09-09 MED ORDER — INSULIN DETEMIR 100 UNIT/ML ~~LOC~~ SOLN: 10.0000 [IU] | Freq: Every day | SUBCUTANEOUS | Status: DC

## 2018-09-09 MED ORDER — INSULIN DETEMIR 100 UNIT/ML ~~LOC~~ SOLN
25.0000 [IU] | Freq: Every day | SUBCUTANEOUS | Status: DC
  Filled 2018-09-09: qty 0.25

## 2018-09-09 MED ORDER — ASPIRIN EC 81 MG PO TBEC
81.0000 mg | DELAYED_RELEASE_TABLET | Freq: Every day | ORAL | Status: DC
  Administered 2018-09-10 – 2018-09-12 (×3): 81 mg via ORAL
  Filled 2018-09-09 (×3): qty 1

## 2018-09-09 NOTE — Progress Notes (Signed)
Paged NP for [place]. regarding bp of 177/78. Patient is asymptomatic denies headache or chest pain at this time.

## 2018-09-09 NOTE — Progress Notes (Signed)
Hypoglycemic Event  CBG: 35  Treatment:Patient given 8 oz of OJ recheked at 56 so 4 oz more oj given then 72  Symptoms: patient reported feeling like blood sugar was low, feeling shaky,    Follow-up CBG: Time:0530  CBG Result:72  Possible Reasons for Event: Patient receiving 35 units of levemir at night,   Comments/MD notified: Paged [facility] NP, orders placed by NP to change the way that levemir is given.     Paulina Fusi

## 2018-09-09 NOTE — Progress Notes (Signed)
ANTICOAGULATION CONSULT NOTE - Follow-up Consult  Pharmacy Consult for Heparin Indication: chest pain/ACS  No Known Allergies  Patient Measurements: Height: 5\' 8"  (172.7 cm) Weight: 220 lb (99.8 kg) IBW/kg (Calculated) : 63.9 Heparin Dosing Weight: 85.8 kg  Vital Signs: Temp: 98.4 F (36.9 C) (01/12 0258) Temp Source: Oral (01/12 0258) BP: 153/67 (01/12 0258) Pulse Rate: 74 (01/11 2229)  Labs: Recent Labs    09/07/18 0805 09/08/18 0353 09/08/18 1615 09/08/18 2230 09/09/18 0327  HGB 9.1* 7.9*  --   --  9.7*  HCT 30.7* 25.9*  --   --  31.1*  PLT 337 313  --   --  351  HEPARINUNFRC  --   --   --   --  0.28*  CREATININE 4.16* 4.29*  --   --  4.58*  TROPONINI  --   --  1.19* 1.28*  --     Estimated Creatinine Clearance: 14.3 mL/min (A) (by C-G formula based on SCr of 4.58 mg/dL (H)).   Assessment: 69 YOF who presented on 1/10 with SOB/wheezing - now noted to have a bump in troponins concerning for ACS/CP. Pharmacy consulted to start Heparin for anticoagulation.   Heparin level slightly subtherapeutic (0.28) on gtt at 1100 units/hr. RN stated that heparin off from 0130-0300 so likely level low end of therapeutic.  Goal of Therapy:  Heparin level 0.3-0.7 units/ml Monitor platelets by anticoagulation protocol: Yes   Plan:  Increase heparin slightly to 1200 units/hr F/u 8 hr heparin level   Thank you for allowing pharmacy to be a part of this patient's care.  Sherlon Handing, PharmD, BCPS Clinical pharmacist  **Pharmacist phone directory can now be found on [email or web address] (PW [email or web address]).  Listed under [place]. 09/09/2018 4:10 AM

## 2018-09-09 NOTE — Progress Notes (Signed)
ANTICOAGULATION CONSULT NOTE - Follow-up Consult  Pharmacy Consult for Heparin Indication: chest pain/ACS  No Known Allergies  Patient Measurements: Height: 5\' 8"  (172.7 cm) Weight: 220 lb (99.8 kg) IBW/kg (Calculated) : 63.9 Heparin Dosing Weight: 85.8 kg  Vital Signs: Temp: 98 F (36.7 C) (01/12 1458) Temp Source: Oral (01/12 1314) BP: 154/63 (01/12 1458) Pulse Rate: 72 (01/12 1458)  Labs: Recent Labs    09/07/18 0805 09/08/18 0353 09/08/18 1615 09/08/18 2230 09/09/18 0327 09/09/18 1152 09/09/18 1925  HGB 9.1* 7.9*  --   --  9.7*  --   --   HCT 30.7* 25.9*  --   --  31.1*  --   --   PLT 337 313  --   --  351  --   --   HEPARINUNFRC  --   --   --   --  0.28* 0.61 0.66  CREATININE 4.16* 4.29*  --   --  4.58*  --   --   TROPONINI  --   --  1.19* 1.28* 1.27*  --   --     Estimated Creatinine Clearance: 14.3 mL/min (A) (by C-G formula based on SCr of 4.58 mg/dL (H)).   Assessment: 63 YOF who presented on 1/10 with SOB/wheezing - now noted to have a bump in troponins concerning for ACS/CP. Pharmacy consulted to start Heparin for anticoagulation.   Heparin level this evening remains therapeutic (HL 0.66 << 0.61, goal of 0.3-0.7). No bleeding noted at this time.   Goal of Therapy:  Heparin level 0.3-0.7 units/ml Monitor platelets by anticoagulation protocol: Yes   Plan:  - Continue Heparin drip at 1200 units/hr (12 ml/hr) - Will continue to monitor for any signs/symptoms of bleeding and will follow up with heparin level in the a.m.   Thank you for allowing pharmacy to be a part of this patient's care.  Alycia Rossetti, PharmD, BCPS Clinical Pharmacist 09/09/2018 8:19 PM   **Pharmacist phone directory can now be found on [email or web address] ([email or web address]).  Listed under [place].

## 2018-09-09 NOTE — Progress Notes (Signed)
Progress Note  Patient Name: Priscilla Davis Date of Encounter: 09/09/2018  Primary Cardiologist: Skeet Latch, MD   Subjective   Feeling well.  No chest pain since admission.  Breathing improving.   Inpatient Medications    Scheduled Meds: . albuterol  5 mg Nebulization Once  . amLODipine  10 mg Oral Daily  . aspirin EC  325 mg Oral Daily  . azithromycin  250 mg Oral Daily  . carvedilol  12.5 mg Oral BID WC  . cholecalciferol  5,000 Units Oral Daily  . dorzolamide  1 drop Both Eyes BID  . furosemide  120 mg Intravenous BID  . gabapentin  200 mg Oral TID  . insulin aspart  0-9 Units Subcutaneous TID WC  . latanoprost  1 drop Both Eyes QHS  . multivitamin with minerals  1 tablet Oral Daily  . pantoprazole  40 mg Oral Daily  . rosuvastatin  40 mg Oral QHS  . vitamin B-12  1,000 mcg Oral Daily   Continuous Infusions: . cefTRIAXone (ROCEPHIN)  IV 1 g (09/09/18 0849)  . heparin 1,200 Units/hr (09/09/18 0500)  . sodium chloride     PRN Meds: acetaminophen, levalbuterol **AND** ipratropium, nitroGLYCERIN   Vital Signs    Vitals:   09/08/18 2313 09/08/18 2328 09/09/18 0258 09/09/18 0556  BP:  (!) 154/70 (!) 153/67 (!) 177/78  Pulse:    70  Resp:  18 18 20   Temp:  (!) 97.5 F (36.4 C) 98.4 F (36.9 C) 97.8 F (36.6 C)  TempSrc:  Oral Oral   SpO2: 93%  92% 95%  Weight:      Height:        Intake/Output Summary (Last 24 hours) at 09/09/2018 1202 Last data filed at 09/09/2018 0853 Gross per 24 hour  Intake 938.26 ml  Output 2700 ml  Net -1761.74 ml   Last 3 Weights 09/07/2018 07/12/2018 06/19/2018  Weight (lbs) 220 lb 232 lb 225 lb  Weight (kg) 99.791 kg 105.235 kg 102.059 kg      Telemetry    Sinus rhythm.  Blocked PACs.  - Personally Reviewed  ECG    Sinus rhythm.  Rate 78 bpm.  Incomplete LBBB.  - Personally Reviewed  Physical Exam   VS:  BP (!) 177/78 (BP Location: Right Arm)   Pulse 70   Temp 97.8 F (36.6 C)   Resp 20   Ht 5\' 8"  (1.727 m)    Wt 99.8 kg   SpO2 95%   BMI 33.45 kg/m  , BMI Body mass index is 33.45 kg/m. GENERAL:  Well appearing HEENT: Pupils equal round and reactive, fundi not visualized, oral mucosa unremarkable NECK:  + jugular venous distention, waveform within normal limits, carotid upstroke brisk and symmetric, no bruits LUNGS:  Mild bibasilar crackles HEART:  RRR.  PMI not displaced or sustained,S1 and S2 within normal limits, no S3, no S4, no clicks, no rubs, no murmurs ABD:  Flat, positive bowel sounds normal in frequency in pitch, no bruits, no rebound, no guarding, no midline pulsatile mass, no hepatomegaly, no splenomegaly EXT:  2 plus pulses throughout, no edema, no cyanosis no clubbing SKIN:  No rashes no nodules NEURO:  Cranial nerves II through XII grossly intact, motor grossly intact throughout PSYCH:  Cognitively intact, oriented to person place and time   Labs    Chemistry Recent Labs  Lab 09/07/18 0805 09/08/18 0353 09/09/18 0327  NA 142 141 142  K 4.1 4.0 4.4  CL 110 111 109  CO2 21* 20* 22  GLUCOSE 179* 84 50*  BUN 37* 49* 57*  CREATININE 4.16* 4.29* 4.58*  CALCIUM 8.5* 8.7* 8.8*  ALBUMIN  --   --  3.0*  GFRNONAA 10* 10* 9*  GFRAA 12* 11* 11*  ANIONGAP 11 10 11      Hematology Recent Labs  Lab 09/07/18 0805 09/08/18 0353 09/09/18 0327  WBC 14.5* 18.1* 20.1*  RBC 3.37* 2.94* 3.46*  HGB 9.1* 7.9* 9.7*  HCT 30.7* 25.9* 31.1*  MCV 91.1 88.1 89.9  MCH 27.0 26.9 28.0  MCHC 29.6* 30.5 31.2  RDW 14.0 14.0 14.0  PLT 337 313 351    Cardiac Enzymes Recent Labs  Lab 09/08/18 1615 09/08/18 2230 09/09/18 0327  TROPONINI 1.19* 1.28* 1.27*    Recent Labs  Lab 09/07/18 0810  TROPIPOC 0.01     BNPNo results for input(s): BNP, PROBNP in the last 168 hours.   DDimer No results for input(s): DDIMER in the last 168 hours.   Radiology    No results found.  Cardiac Studies   Echo pending  Patient Profile     68 y.o. female with DM, CKD IV, and asthma here with  hypertensive urgency and NSTEMI.   Assessment & Plan    # Elevated troponin: Ms. Smyser presented with several days of shortness of breath and edema.  BP poorly controlled.  Troponin is elevated to 1.27 and relatively flat.  I suspect that this is attributed to demand ischemia in the setting of poorly controlled hypertension and volume overload.  Her renal function is worsening.  I suspect she will need HD soon.  OK to continue heparin for 48 hours.  No plans for LHC now unless she will start HD.  The timing of cath will be determined by her echo findings.  Reduce aspirin to 81mg .  Check fasting lipids.   # Hypertensive heart disease: BP poorly controlled.  Continue amlodipine and carvedilol.  We will start hydralazine 50mg  bid.  Her home losartan is on hold.        For questions or updates, please contact [place] Please consult [email or web address] for contact info under        Signed, Skeet Latch, MD  09/09/2018, 12:02 PM

## 2018-09-09 NOTE — Plan of Care (Signed)

## 2018-09-09 NOTE — Progress Notes (Signed)
ANTICOAGULATION CONSULT NOTE - Follow-up Consult  Pharmacy Consult for Heparin Indication: chest pain/ACS  No Known Allergies  Patient Measurements: Height: 5\' 8"  (172.7 cm) Weight: 220 lb (99.8 kg) IBW/kg (Calculated) : 63.9 Heparin Dosing Weight: 85.8 kg  Vital Signs: Temp: 97.8 F (36.6 C) (01/12 0556) Temp Source: Oral (01/12 0258) BP: 177/78 (01/12 0556) Pulse Rate: 70 (01/12 0556)  Labs: Recent Labs    09/07/18 0805 09/08/18 0353 09/08/18 1615 09/08/18 2230 09/09/18 0327 09/09/18 1152  HGB 9.1* 7.9*  --   --  9.7*  --   HCT 30.7* 25.9*  --   --  31.1*  --   PLT 337 313  --   --  351  --   HEPARINUNFRC  --   --   --   --  0.28* 0.61  CREATININE 4.16* 4.29*  --   --  4.58*  --   TROPONINI  --   --  1.19* 1.28* 1.27*  --     Estimated Creatinine Clearance: 14.3 mL/min (A) (by C-G formula based on SCr of 4.58 mg/dL (H)).   Assessment: 75 YOF who presented on 1/10 with SOB/wheezing - now noted to have a bump in troponins concerning for ACS/CP. Pharmacy consulted to start Heparin for anticoagulation.   Heparin level therapeutic (0.61) on gtt at 1200 units/hr. CBC stable. No signs/symptoms of bleeding or issues with heparin infusion reported by nurse.  Goal of Therapy:  Heparin level 0.3-0.7 units/ml Monitor platelets by anticoagulation protocol: Yes   Plan:  Continue heparin infusion at 1200 units/hr F/u confirmatory 8 hr heparin level  Daily heparin level/CBC Monitor for signs/symptoms of bleeding  Thank you for allowing pharmacy to be a part of this patient's care.  Leron Croak, PharmD PGY1 Pharmacy Resident Phone: [phone]  Please check AMION for all [place] phone numbers 09/09/2018 12:47 PM

## 2018-09-09 NOTE — Progress Notes (Signed)
PROGRESS NOTE        PATIENT DETAILS Name: Priscilla Davis Age: 68 y.o. Sex: female Date of Birth: 1951/07/16 Admit Date: 09/07/2018 Admitting Physician Barb Merino, MD [id number], Beryle Lathe, FNP  Brief Narrative: Patient is a 68 y.o. female with history of insulin-dependent DM-2, stage IV CKD, asthma-presented with 2-day history of cough, and worsening exertional dyspnea.  Chest x-ray positive for bilateral pneumonia-she was started on antibiotics and admitted to the hospitalist service.  Hospital course complicated by chest pain and development of non-STEMI.  Subjective: Not much better-still short of breath with minimal exertion.  No chest pain.  Assessment/Plan: Acute hypoxic respiratory failure secondary to pulmonary edema in a setting of worsening renal failure versus PNA: Appears stable at rest-but still wheezing-and continues to have significant amount of exertional dyspnea.  Does have leukocytosis-but afebrile-influenza PCR and blood cultures negative-repeat chest x-ray on 1/12 (my reading) shows significant improvement-this was after patient was started on IV Lasix yesterday.  Suspect that this is now more of pulmonary edema in the setting of worsening renal failure/non-STEMI rather than pneumonia.  Continue IV Lasix 120 mg IV twice daily and IV antibiotics.  Will discuss with nephrology over the phone.  CKD stage IV: Seems to have progressive renal failure-her creatinine is worse compared to her most recent levels.  She follows with [facility]-and has had a left AV fistula placed in anticipation of starting HD soon.  Continue IV Lasix-chest x-ray appears somewhat improved than on admission.  See above.  Non-STEMI: Developed chest pain yesterday with significantly elevated troponin levels.  Currently chest pain-free-continue aspirin, statin, beta-blocker, IV heparin for another 24 hours.  Cardiology following-await echocardiogram and further recommendations  from cardiology  Insulin-dependent DM-2 hypoglycemia: Developed hypoglycemia again earlier this morning-this was in spite of Levemir dosage being decreased to 35 units twice daily-we will go ahead and decrease Levemir to just 15 units nightly, cautiously continue with SSI.  Follow.    Hypertension: Blood pressure still elevated-continue with amlodipine, metoprolol and IV Lasix  Dyslipidemia: Continue statin  Asthma: Wheezing-but suspect this is mostly cardiac asthma/pulmonary edema rather than bronchial asthma exacerbation-continue with as needed bronchodilators-on Lasix.    Slightly prolonged QTC: Magnesium levels within normal limits-recheck EKG in a.m.-has a partially left bundle that may be contributing to slightly prolonged QTC  Tobacco abuse: Counseled  DVT Prophylaxis: IV heparin  Code Status: Full code   Family Communication: None at bedside  Disposition Plan Remain inpatient-not stable for discharge at this time.  Requires several more days of hospitalization  Antimicrobial agents: Anti-infectives (From admission, onward)   Start     Dose/Rate Route Frequency Ordered Stop   09/08/18 1000  cefTRIAXone (ROCEPHIN) 1 g in sodium chloride 0.9 % 100 mL IVPB     1 g 200 mL/hr over 30 Minutes Intravenous Every 24 hours 09/07/18 1517     09/08/18 1000  azithromycin (ZITHROMAX) tablet 250 mg     250 mg Oral Daily 09/07/18 1517 09/12/18 0959   09/07/18 0915  cefTRIAXone (ROCEPHIN) 2 g in sodium chloride 0.9 % 100 mL IVPB  Status:  Discontinued     2 g 200 mL/hr over 30 Minutes Intravenous Every 24 hours 09/07/18 0910 09/07/18 1517   09/07/18 0915  azithromycin (ZITHROMAX) 500 mg in sodium chloride 0.9 % 250 mL IVPB  Status:  Discontinued  500 mg 250 mL/hr over 60 Minutes Intravenous Every 24 hours 09/07/18 0910 09/07/18 1517      Procedures: None  CONSULTS:  None  Time spent: 35 minutes-Greater than 50% of this time was spent in counseling, explanation of diagnosis,  planning of further management, and coordination of care.  MEDICATIONS: Scheduled Meds: . albuterol  5 mg Nebulization Once  . amLODipine  10 mg Oral Daily  . aspirin EC  325 mg Oral Daily  . azithromycin  250 mg Oral Daily  . carvedilol  12.5 mg Oral BID WC  . cholecalciferol  5,000 Units Oral Daily  . dorzolamide  1 drop Both Eyes BID  . furosemide  120 mg Intravenous BID  . gabapentin  200 mg Oral TID  . insulin aspart  0-9 Units Subcutaneous TID WC  . latanoprost  1 drop Both Eyes QHS  . multivitamin with minerals  1 tablet Oral Daily  . pantoprazole  40 mg Oral Daily  . rosuvastatin  40 mg Oral QHS  . vitamin B-12  1,000 mcg Oral Daily   Continuous Infusions: . cefTRIAXone (ROCEPHIN)  IV 1 g (09/09/18 0849)  . heparin 1,200 Units/hr (09/09/18 0500)  . sodium chloride     PRN Meds:.acetaminophen, levalbuterol **AND** ipratropium, nitroGLYCERIN   PHYSICAL EXAM: Vital signs: Vitals:   09/08/18 2313 09/08/18 2328 09/09/18 0258 09/09/18 0556  BP:  (!) 154/70 (!) 153/67 (!) 177/78  Pulse:    70  Resp:  18 18 20   Temp:  (!) 97.5 F (36.4 C) 98.4 F (36.9 C) 97.8 F (36.6 C)  TempSrc:  Oral Oral   SpO2: 93%  92% 95%  Weight:      Height:       Filed Weights   09/07/18 0750  Weight: 99.8 kg   Body mass index is 33.45 kg/m.   General appearance:Awake, alert, not in any distress.  Eyes:no scleral icterus. HEENT: Atraumatic and Normocephalic Neck: supple, Resp:Good air entry bilaterally, few bibasilar rales-mostly rhonchi heard CVS: S1 S2 regular GI: Bowel sounds present, Non tender and not distended with no gaurding, rigidity or rebound. Extremities: B/L Lower Ext shows no edema, both legs are warm to touch Neurology:  Non focal Psychiatric: Normal judgment and insight. Normal mood. Musculoskeletal:No digital cyanosis Skin:No Rash, warm and dry Wounds:N/A  I have personally reviewed following labs and imaging studies  LABORATORY DATA: CBC: Recent Labs    Lab 09/07/18 0805 09/08/18 0353 09/09/18 0327  WBC 14.5* 18.1* 20.1*  NEUTROABS  --  16.2*  --   HGB 9.1* 7.9* 9.7*  HCT 30.7* 25.9* 31.1*  MCV 91.1 88.1 89.9  PLT 337 313 676    Basic Metabolic Panel: Recent Labs  Lab 09/07/18 0805 09/08/18 0353 09/09/18 0327  NA 142 141 142  K 4.1 4.0 4.4  CL 110 111 109  CO2 21* 20* 22  GLUCOSE 179* 84 50*  BUN 37* 49* 57*  CREATININE 4.16* 4.29* 4.58*  CALCIUM 8.5* 8.7* 8.8*  MG  --  2.0 2.0  PHOS  --   --  4.3    GFR: Estimated Creatinine Clearance: 14.3 mL/min (A) (by C-G formula based on SCr of 4.58 mg/dL (H)).  Liver Function Tests: Recent Labs  Lab 09/09/18 0327  ALBUMIN 3.0*   No results for input(s): LIPASE, AMYLASE in the last 168 hours. No results for input(s): AMMONIA in the last 168 hours.  Coagulation Profile: No results for input(s): INR, PROTIME in the last 168 hours.  Cardiac  Enzymes: Recent Labs  Lab 09/08/18 1615 09/08/18 2230 09/09/18 0327  TROPONINI 1.19* 1.28* 1.27*    BNP (last 3 results) No results for input(s): PROBNP in the last 8760 hours.  HbA1C: No results for input(s): HGBA1C in the last 72 hours.  CBG: Recent Labs  Lab 09/08/18 2155 09/09/18 0435 09/09/18 0501 09/09/18 0530 09/09/18 0808  GLUCAP 101* 35* 56* 72 101*    Lipid Profile: No results for input(s): CHOL, HDL, LDLCALC, TRIG, CHOLHDL, LDLDIRECT in the last 72 hours.  Thyroid Function Tests: No results for input(s): TSH, T4TOTAL, FREET4, T3FREE, THYROIDAB in the last 72 hours.  Anemia Panel: No results for input(s): VITAMINB12, FOLATE, FERRITIN, TIBC, IRON, RETICCTPCT in the last 72 hours.  Urine analysis:    Component Value Date/Time   COLORURINE YELLOW 07/26/2017 1000   APPEARANCEUR HAZY (A) 07/26/2017 1000   LABSPEC 1.015 07/26/2017 1000   PHURINE 6.0 07/26/2017 1000   GLUCOSEU NEGATIVE 07/26/2017 1000   HGBUR NEGATIVE 07/26/2017 1000   BILIRUBINUR NEGATIVE 07/26/2017 1000   KETONESUR NEGATIVE  07/26/2017 1000   PROTEINUR 100 (A) 07/26/2017 1000   NITRITE NEGATIVE 07/26/2017 1000   LEUKOCYTESUR TRACE (A) 07/26/2017 1000    Sepsis Labs: Lactic Acid, Venous    Component Value Date/Time   LATICACIDVEN 2.56 (HH) 09/07/2018 1130    MICROBIOLOGY: Recent Results (from the past 240 hour(s))  Blood Culture (routine x 2)     Status: None (Preliminary result)   Collection Time: 09/07/18  9:10 AM  Result Value Ref Range Status   Specimen Description BLOOD BLOOD RIGHT HAND  Final   Special Requests   Final    BOTTLES DRAWN AEROBIC AND ANAEROBIC Blood Culture adequate volume   Culture   Final    NO GROWTH 2 DAYS Performed at [facility], [place] [address] [place], [place] [address]    Report Status PENDING  Incomplete  Blood Culture (routine x 2)     Status: None (Preliminary result)   Collection Time: 09/07/18  9:15 AM  Result Value Ref Range Status   Specimen Description BLOOD BLOOD RIGHT HAND  Final   Special Requests   Final    BOTTLES DRAWN AEROBIC AND ANAEROBIC Blood Culture adequate volume   Culture   Final    NO GROWTH 2 DAYS Performed at [facility], [place] [address] [place], [place] [address]    Report Status PENDING  Incomplete  Culture, sputum-assessment     Status: None (Preliminary result)   Collection Time: 09/07/18  3:18 PM  Result Value Ref Range Status   Specimen Description SPU  Final   Special Requests NONE  Final   Sputum evaluation   Final    THIS SPECIMEN IS ACCEPTABLE FOR SPUTUM CULTURE Performed at [facility], [address], [place], [place] [address]    Report Status PENDING  Incomplete  Culture, respiratory     Status: None (Preliminary result)   Collection Time: 09/07/18  3:18 PM  Result Value Ref Range Status   Specimen Description SPU  Final   Special Requests NONE Reflexed from [id number]  Final   Gram Stain   Final    FEW WBC PRESENT, PREDOMINANTLY PMN RARE SQUAMOUS EPITHELIAL CELLS PRESENT FEW GRAM POSITIVE COCCI FEW  GRAM NEGATIVE RODS RARE GRAM POSITIVE RODS Performed at [facility], [place] [address], [place],  [address]    Culture CULTURE REINCUBATED FOR BETTER GROWTH  Final   Report Status PENDING  Incomplete  RADIOLOGY STUDIES/RESULTS: Dg Chest 2 View  Result Date: 09/07/2018 CLINICAL DATA:  Shortness of breath and productive cough. EXAM: CHEST - 2 VIEW COMPARISON:  06/10/2018 FINDINGS: The patient has new bilateral upper lobe infiltrates and right middle or lower lobe infiltrate. Mild chronic cardiomegaly. Pulmonary vascularity is normal. No effusions. No acute bone abnormality. IMPRESSION: Bilateral pneumonia. Electronically Signed   By: Lorriane Shire M.D.   On: 09/07/2018 08:56     LOS: 2 days   Oren Binet, MD  [facility]  If 7PM-7AM, please contact night-coverage  Please page via [email or web address]-Password [email or web address]-click on MD name and type text message  09/09/2018, 11:24 AM

## 2018-09-10 ENCOUNTER — Inpatient Hospital Stay (HOSPITAL_COMMUNITY): Payer: Medicare Other

## 2018-09-10 DIAGNOSIS — R0602 Shortness of breath: Secondary | ICD-10-CM

## 2018-09-10 DIAGNOSIS — J9621 Acute and chronic respiratory failure with hypoxia: Secondary | ICD-10-CM

## 2018-09-10 LAB — RENAL FUNCTION PANEL
Albumin: 2.7 g/dL — ABNORMAL LOW (ref 3.5–5.0)
Anion gap: 12 (ref 5–15)
BUN: 68 mg/dL — ABNORMAL HIGH (ref 8–23)
CO2: 23 mmol/L (ref 22–32)
Calcium: 8.3 mg/dL — ABNORMAL LOW (ref 8.9–10.3)
Chloride: 106 mmol/L (ref 98–111)
Creatinine, Ser: 4.97 mg/dL — ABNORMAL HIGH (ref 0.44–1.00)
GFR calc non Af Amer: 8 mL/min — ABNORMAL LOW (ref >60)
GFR, EST AFRICAN AMERICAN: 10 mL/min — AB (ref >60)
Glucose, Bld: 120 mg/dL — ABNORMAL HIGH (ref 70–99)
Phosphorus: 4.1 mg/dL (ref 2.5–4.6)
Potassium: 4 mmol/L (ref 3.5–5.1)
Sodium: 141 mmol/L (ref 135–145)

## 2018-09-10 LAB — CBC
HEMATOCRIT: 30.6 % — AB (ref 36.0–46.0)
Hemoglobin: 9.6 g/dL — ABNORMAL LOW (ref 12.0–15.0)
MCH: 27.7 pg (ref 26.0–34.0)
MCHC: 31.4 g/dL (ref 30.0–36.0)
MCV: 88.4 fL (ref 80.0–100.0)
Platelets: 361 10*3/uL (ref 150–400)
RBC: 3.46 MIL/uL — ABNORMAL LOW (ref 3.87–5.11)
RDW: 14.1 % (ref 11.5–15.5)
WBC: 16.1 10*3/uL — ABNORMAL HIGH (ref 4.0–10.5)
nRBC: 0 % (ref 0.0–0.2)

## 2018-09-10 LAB — LIPID PANEL
Cholesterol: 102 mg/dL (ref 0–200)
HDL: 45 mg/dL (ref >40)
LDL Cholesterol: 45 mg/dL (ref 0–99)
Total CHOL/HDL Ratio: 2.3 RATIO
Triglycerides: 60 mg/dL (ref <150)
VLDL: 12 mg/dL (ref 0–40)

## 2018-09-10 LAB — IRON AND TIBC
Iron: 17 ug/dL — ABNORMAL LOW (ref 28–170)
Saturation Ratios: 8 % — ABNORMAL LOW (ref 10.4–31.8)
TIBC: 206 ug/dL — ABNORMAL LOW (ref 250–450)
UIBC: 189 ug/dL

## 2018-09-10 LAB — GLUCOSE, CAPILLARY
GLUCOSE-CAPILLARY: 111 mg/dL — AB (ref 70–99)
Glucose-Capillary: 87 mg/dL (ref 70–99)
Glucose-Capillary: 120 mg/dL — ABNORMAL HIGH (ref 70–99)
Glucose-Capillary: 106 mg/dL — ABNORMAL HIGH (ref 70–99)
Glucose-Capillary: 159 mg/dL — ABNORMAL HIGH (ref 70–99)

## 2018-09-10 LAB — EXPECTORATED SPUTUM ASSESSMENT W GRAM STAIN, RFLX TO RESP C

## 2018-09-10 LAB — ECHOCARDIOGRAM COMPLETE
Height: 68 in
Weight: 3520 oz

## 2018-09-10 LAB — HEPARIN LEVEL (UNFRACTIONATED): Heparin Unfractionated: 0.64 IU/mL (ref 0.30–0.70)

## 2018-09-10 MED ORDER — AMLODIPINE BESYLATE 10 MG PO TABS
10.0000 mg | ORAL_TABLET | Freq: Every day | ORAL | Status: DC
  Administered 2018-09-11: 10 mg via ORAL
  Filled 2018-09-10: qty 1

## 2018-09-10 MED ORDER — FUROSEMIDE 10 MG/ML IJ SOLN: 120.0000 mg | Freq: Two times a day (BID) | INTRAMUSCULAR | Status: DC

## 2018-09-10 MED ORDER — CEFDINIR 300 MG PO CAPS
300.0000 mg | ORAL_CAPSULE | Freq: Every day | ORAL | Status: AC
  Administered 2018-09-10 – 2018-09-11 (×2): 300 mg via ORAL
  Filled 2018-09-10 (×3): qty 1

## 2018-09-10 MED ORDER — FUROSEMIDE 80 MG PO TABS
160.0000 mg | ORAL_TABLET | Freq: Two times a day (BID) | ORAL | Status: DC
  Administered 2018-09-10 – 2018-09-12 (×4): 160 mg via ORAL
  Filled 2018-09-10 (×4): qty 2

## 2018-09-10 MED ORDER — FUROSEMIDE 10 MG/ML IJ SOLN
120.0000 mg | Freq: Two times a day (BID) | INTRAMUSCULAR | Status: DC
  Filled 2018-09-10: qty 12

## 2018-09-10 NOTE — Progress Notes (Signed)
ANTICOAGULATION CONSULT NOTE - Follow-up Consult  Pharmacy Consult for Heparin Indication: chest pain/ACS  No Known Allergies  Patient Measurements: Height: 5\' 8"  (172.7 cm) Weight: 220 lb (99.8 kg) IBW/kg (Calculated) : 63.9 Heparin Dosing Weight: 85.8 kg  Vital Signs: Temp: 98.4 F (36.9 C) (01/13 0414) Temp Source: Oral (01/13 0414) BP: 176/76 (01/13 0414) Pulse Rate: 72 (01/13 0414)  Labs: Recent Labs    09/08/18 0353 09/08/18 1615 09/08/18 2230  09/09/18 0327 09/09/18 1152 09/09/18 1925 09/10/18 0344  HGB 7.9*  --   --   --  9.7*  --   --  9.6*  HCT 25.9*  --   --   --  31.1*  --   --  30.6*  PLT 313  --   --   --  351  --   --  361  HEPARINUNFRC  --   --   --    < > 0.28* 0.61 0.66 0.64  CREATININE 4.29*  --   --   --  4.58*  --   --  4.97*  TROPONINI  --  1.19* 1.28*  --  1.27*  --   --   --    < > = values in this interval not displayed.    Estimated Creatinine Clearance: 13.2 mL/min (A) (by C-G formula based on SCr of 4.97 mg/dL (H)).   Assessment: 67 YOF who presented on 1/10 with SOB/wheezing - now noted to have a bump in troponins concerning for ACS/CP. Pharmacy consulted to start Heparin for anticoagulation.   Heparin level this morning remains therapeutic at 0.64, on 1200 units/hr. Hgb 9.6, plt 361. No infusion issues. No bleeding noted at this time.   Goal of Therapy:  Heparin level 0.3-0.7 units/ml Monitor platelets by anticoagulation protocol: Yes   Plan:  - Continue Heparin drip at 1200 units/hr (12 ml/hr) - Stop date for 48 hours total - ends today at 1800  Thank you for allowing pharmacy to be a part of this patient's care.  [name], PharmD, BCCCP Clinical Pharmacist  Pager: [phone] Phone: [phone]  **Pharmacist phone directory can now be found on [email or web address] (PW [email or web address]).  Listed under [place].

## 2018-09-10 NOTE — Care Management Important Message (Signed)
Important Message  Patient Details  Name: Priscilla Davis MRN: [id number] Date of Birth: 1950/12/04   Medicare Important Message Given:  Yes    Priscilla Davis Circle 09/10/2018, 4:27 PM

## 2018-09-10 NOTE — Progress Notes (Signed)
Echocardiogram complete  Madelaine Etienne, RDCS

## 2018-09-10 NOTE — Evaluation (Addendum)
Physical Therapy Evaluation Patient Details Name: Priscilla Davis MRN: [id number] DOB: [birth year] Today's Date: 09/10/2018   History of Present Illness  68yo female presenting with 2 days of cough with green sputum. Admitted for B lob bacterial pneumonia. 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 Patient received asleep in bed but easily woken, pleasant and willing to participate in therapy; SpO2 on room air 89-91% at rest. Able to complete bed mobility with Mod(I), transfers with min guard, and gait approximately 27f total with min guard on room air however SpO2 dropped to 84% with activity, required 3.5LPM O2 to recover to 94%. She was left in bed with all needs met this afternoon. She will continue to benefit from skilled PT services in the acute setting as well as skilled HHPT services moving forward.     Follow Up Recommendations Home health PT    Equipment Recommendations  3in1 (PT)    Recommendations for Other Services       Precautions / Restrictions Precautions Precautions: Other (comment) Precaution Comments: watch SPO2  Restrictions Weight Bearing Restrictions: No      Mobility  Bed Mobility Overal bed mobility: Needs Assistance Bed Mobility: Supine to Sit;Sit to Supine     Supine to sit: Modified independent (Device/Increase time) Sit to supine: Modified independent (Device/Increase time)   General bed mobility comments: Mod(I) for bed mobility, did require some assist in clearing lines in preparation for mobility   Transfers Overall transfer level: Needs assistance Equipment used: None Transfers: Sit to/from Stand Sit to Stand: Min guard         General transfer comment: min guard for safety, no other physical assist given   Ambulation/Gait Ambulation/Gait assistance: Min guard Gait Distance (Feet): 20 Feet(1106fx2 ) Assistive  device: None Gait Pattern/deviations: Step-through pattern;Decreased step length - left;Decreased stance time - right;Decreased stride length;Trunk flexed Gait velocity: decreased    General Gait Details: gait to and from bathroom in her room on room air; SpO2 initially 91% on room air but dropped to 84% with activity, required replacement of wall O2 (3.5LPM) to recover   Stairs            Wheelchair Mobility    Modified Rankin (Stroke Patients Only)       Balance Overall balance assessment: Needs assistance Sitting-balance support: Bilateral upper extremity supported;Feet supported Sitting balance-Leahy Scale: Good     Standing balance support: No upper extremity supported;During functional activity Standing balance-Leahy Scale: Fair                               Pertinent Vitals/Pain Pain Assessment: No/denies pain    Home Living Family/patient expects to be discharged to:: Private residence Living Arrangements: Other relatives(brother and sister in law ) Available Help at Discharge: Family;Available PRN/intermittently Type of Home: House Home Access: Stairs to enter Entrance Stairs-Rails: Can reach both Entrance Stairs-Number of Steps: 6 Home Layout: Multi-level Home Equipment: Cane - single point      Prior Function Level of Independence: Independent with assistive device(s)         Comments: cane outside, no device for short distances      Hand Dominance        Extremity/Trunk Assessment   Upper Extremity Assessment Upper Extremity Assessment: Defer to OT evaluation  Lower Extremity Assessment Lower Extremity Assessment: Generalized weakness    Cervical / Trunk Assessment Cervical / Trunk Assessment: Kyphotic  Communication   Communication: No difficulties  Cognition Arousal/Alertness: Awake/alert Behavior During Therapy: WFL for tasks assessed/performed Overall Cognitive Status: Within Functional Limits for tasks  assessed                                        General Comments      Exercises     Assessment/Plan    PT Assessment Patient needs continued PT services  PT Problem List Decreased strength;Decreased coordination;Cardiopulmonary status limiting activity;Decreased activity tolerance;Decreased knowledge of use of DME;Decreased balance;Decreased safety awareness;Decreased mobility;Decreased knowledge of precautions       PT Treatment Interventions DME instruction;Therapeutic exercise;Gait training;Balance training;Stair training;Neuromuscular re-education;Cognitive remediation;Functional mobility training;Therapeutic activities;Patient/family education    PT Goals (Current goals can be found in the Care Plan section)  Acute Rehab PT Goals Patient Stated Goal: feel better, go home  PT Goal Formulation: With patient Time For Goal Achievement: 09/24/18 Potential to Achieve Goals: Good    Frequency Min 3X/week   Barriers to discharge        Co-evaluation               AM-PAC PT "6 Clicks" Mobility  Outcome Measure Help needed turning from your back to your side while in a flat bed without using bedrails?: None Help needed moving from lying on your back to sitting on the side of a flat bed without using bedrails?: None Help needed moving to and from a bed to a chair (including a wheelchair)?: A Little Help needed standing up from a chair using your arms (e.g., wheelchair or bedside chair)?: A Little Help needed to walk in hospital room?: A Little Help needed climbing 3-5 steps with a railing? : A Little 6 Click Score: 20    End of Session   Activity Tolerance: Patient tolerated treatment well Patient left: in bed;with call bell/phone within reach Nurse Communication: Mobility status;Other (comment)(SpO2 during session ) PT Visit Diagnosis: Unsteadiness on feet (R26.81);Muscle weakness (generalized) (M62.81);Difficulty in walking, not elsewhere classified  (R26.2)    Time: 1440-1505 PT Time Calculation (min) (ACUTE ONLY): 25 min   Charges:   PT Evaluation $PT Eval Low Complexity: 1 Low PT Treatments $Self Care/Home Management: 8-22       Deniece Ree PT, DPT, CBIS  Supplemental Physical Therapist [place]    Pager [phone] Acute Rehab Office [phone]

## 2018-09-10 NOTE — Progress Notes (Addendum)
PROGRESS NOTE        PATIENT DETAILS Name: Priscilla Davis Age: 68 y.o. Sex: female Date of Birth: 04/04/1951 Admit Date: 09/07/2018 Admitting Physician Barb Merino, MD NWG:[id number], Beryle Lathe, FNP  Brief Narrative: Patient is a 68 y.o. female with history of insulin-dependent DM-2, stage IV CKD, asthma-presented with 2-day history of cough, and worsening exertional dyspnea.  Chest x-ray positive for bilateral pneumonia-she was started on antibiotics and admitted to the hospitalist service.  Hospital course complicated by chest pain and development of non-STEMI.  Subjective: Breathing is much better-put out 4.6 L of urine yesterday with IV Lasix.  Assessment/Plan: Acute hypoxic respiratory failure secondary to pulmonary edema in a setting of worsening renal failure versus PNA: Markedly improved today-breathing is much better-significant diuresis overnight of 4.6 L with IV Lasix.  At this point-do not think patient had pneumonia-thank that leukocytosis probably just reactive.  Blood cultures and influenza PCR negative.  Will stop Rocephin and Zithromax-and transition to Omnicef-stop date of 1/14 (to complete 5 days)  AKI on CKD stage IV: Likely progressive renal failure-good response to Lasix-not on oral Lasix in the outpatient setting.  Follows with [facility]-and recently had a AV fistula placed in anticipation of starting HD soon.  Have formally consulted nephrology today.  Non-STEMI: No further chest pain-continue IV heparin for total of 48 hours-suspect can be discontinued on 1/14, aspirin, statin, beta-blocker.  Cardiology following-recommendations are to proceed to outpatient nuclear stress test.  Await echocardiogram  Insulin-dependent DM-2 hypoglycemia: No further hypoglycemic episodes overnight-CBGs stable with 15 units of Levemir and SSI.  Follow.   Anemia: Secondary to CKD-worsened by acute illness-transfused 1 unit of PRBC on 1/11-as hemoglobin was less  than 8 and patient was having non-STEMI.  Hemoglobin currently stable  Hypertension: Moderately controlled-continue with amlodipine, metoprolol and IV Lasix.   Dyslipidemia: Continue statin  Asthma: Stable without any wheezing-continue with as needed bronchodilators.  Wheezing-but suspect this is mostly cardiac asthma/pulmonary edema rather than bronchial asthma exacerbation-continue with as needed bronchodilators-on Lasix.    Slightly prolonged QTC: Magnesium levels within normal limits-recheck EKG in a.m.-has a partially left bundle that may be contributing to slightly prolonged QTC  Tobacco abuse: Counseled  DVT Prophylaxis: IV heparin  Code Status: Full code   Family Communication: None at bedside  Disposition Plan Remain inpatient-requires a few more days of hospitalization before consideration of discharge.  Antimicrobial agents: Anti-infectives (From admission, onward)   Start     Dose/Rate Route Frequency Ordered Stop   09/08/18 1000  cefTRIAXone (ROCEPHIN) 1 g in sodium chloride 0.9 % 100 mL IVPB     1 g 200 mL/hr over 30 Minutes Intravenous Every 24 hours 09/07/18 1517     09/08/18 1000  azithromycin (ZITHROMAX) tablet 250 mg     250 mg Oral Daily 09/07/18 1517 09/12/18 0959   09/07/18 0915  cefTRIAXone (ROCEPHIN) 2 g in sodium chloride 0.9 % 100 mL IVPB  Status:  Discontinued     2 g 200 mL/hr over 30 Minutes Intravenous Every 24 hours 09/07/18 0910 09/07/18 1517   09/07/18 0915  azithromycin (ZITHROMAX) 500 mg in sodium chloride 0.9 % 250 mL IVPB  Status:  Discontinued     500 mg 250 mL/hr over 60 Minutes Intravenous Every 24 hours 09/07/18 0910 09/07/18 1517      Procedures: None  CONSULTS:  None  Time spent: 25 minutes-Greater than 50% of this time was spent in counseling, explanation of diagnosis, planning of further management, and coordination of care.  MEDICATIONS: Scheduled Meds: . albuterol  5 mg Nebulization Once  . amLODipine  10 mg Oral  Daily  . aspirin EC  81 mg Oral Daily  . azithromycin  250 mg Oral Daily  . carvedilol  12.5 mg Oral BID WC  . cholecalciferol  5,000 Units Oral Daily  . dorzolamide  1 drop Both Eyes BID  . furosemide  120 mg Intravenous BID  . gabapentin  200 mg Oral TID  . hydrALAZINE  50 mg Oral BID  . insulin aspart  0-9 Units Subcutaneous TID WC  . insulin detemir  15 Units Subcutaneous QHS  . latanoprost  1 drop Both Eyes QHS  . multivitamin with minerals  1 tablet Oral Daily  . pantoprazole  40 mg Oral Daily  . rosuvastatin  40 mg Oral QHS  . vitamin B-12  1,000 mcg Oral Daily   Continuous Infusions: . cefTRIAXone (ROCEPHIN)  IV 1 g (09/10/18 1020)  . heparin 1,200 Units/hr (09/10/18 0756)  . sodium chloride     PRN Meds:.acetaminophen, levalbuterol **AND** ipratropium, nitroGLYCERIN   PHYSICAL EXAM: Vital signs: Vitals:   09/09/18 1314 09/09/18 1458 09/09/18 2147 09/10/18 0414  BP: (!) 157/69 (!) 154/63 (!) 156/67 (!) 176/76  Pulse: 69 72 75 72  Resp:  18  20  Temp: 97.7 F (36.5 C) 98 F (36.7 C)  98.4 F (36.9 C)  TempSrc: Oral   Oral  SpO2: 93% 99% 93% 95%  Weight:      Height:       Filed Weights   09/07/18 0750  Weight: 99.8 kg   Body mass index is 33.45 kg/m.   General appearance:Awake, alert, not in any distress.  Eyes:no scleral icterus. HEENT: Atraumatic and Normocephalic Neck: supple, no JVD. Resp:Good air entry bilaterally,no rales or rhonchi CVS: S1 S2 regular GI: Bowel sounds present, Non tender and not distended with no gaurding, rigidity or rebound. Extremities: B/L Lower Ext shows no edema, both legs are warm to touch Neurology:  Non focal Psychiatric: Normal judgment and insight. Normal mood. Musculoskeletal:No digital cyanosis Skin:No Rash, warm and dry Wounds:N/A  I have personally reviewed following labs and imaging studies  LABORATORY DATA: CBC: Recent Labs  Lab 09/07/18 0805 09/08/18 0353 09/09/18 0327 09/10/18 0344  WBC 14.5*  18.1* 20.1* 16.1*  NEUTROABS  --  16.2*  --   --   HGB 9.1* 7.9* 9.7* 9.6*  HCT 30.7* 25.9* 31.1* 30.6*  MCV 91.1 88.1 89.9 88.4  PLT 337 313 351 242    Basic Metabolic Panel: Recent Labs  Lab 09/07/18 0805 09/08/18 0353 09/09/18 0327 09/10/18 0344  NA 142 141 142 141  K 4.1 4.0 4.4 4.0  CL 110 111 109 106  CO2 21* 20* 22 23  GLUCOSE 179* 84 50* 120*  BUN 37* 49* 57* 68*  CREATININE 4.16* 4.29* 4.58* 4.97*  CALCIUM 8.5* 8.7* 8.8* 8.3*  MG  --  2.0 2.0  --   PHOS  --   --  4.3 4.1    GFR: Estimated Creatinine Clearance: 13.2 mL/min (A) (by C-G formula based on SCr of 4.97 mg/dL (H)).  Liver Function Tests: Recent Labs  Lab 09/09/18 0327 09/10/18 0344  ALBUMIN 3.0* 2.7*   No results for input(s): LIPASE, AMYLASE in the last 168 hours. No results for input(s): AMMONIA in the last 168 hours.  Coagulation Profile: No results for input(s): INR, PROTIME in the last 168 hours.  Cardiac Enzymes: Recent Labs  Lab 09/08/18 1615 09/08/18 2230 09/09/18 0327  TROPONINI 1.19* 1.28* 1.27*    BNP (last 3 results) No results for input(s): PROBNP in the last 8760 hours.  HbA1C: No results for input(s): HGBA1C in the last 72 hours.  CBG: Recent Labs  Lab 09/09/18 1658 09/09/18 2141 09/10/18 0604 09/10/18 0753 09/10/18 1155  GLUCAP 132* 165* 87 120* 111*    Lipid Profile: Recent Labs    09/10/18 0344  CHOL 102  HDL 45  LDLCALC 45  TRIG 60  CHOLHDL 2.3    Thyroid Function Tests: No results for input(s): TSH, T4TOTAL, FREET4, T3FREE, THYROIDAB in the last 72 hours.  Anemia Panel: No results for input(s): VITAMINB12, FOLATE, FERRITIN, TIBC, IRON, RETICCTPCT in the last 72 hours.  Urine analysis:    Component Value Date/Time   COLORURINE YELLOW 07/26/2017 1000   APPEARANCEUR HAZY (A) 07/26/2017 1000   LABSPEC 1.015 07/26/2017 1000   PHURINE 6.0 07/26/2017 1000   GLUCOSEU NEGATIVE 07/26/2017 1000   HGBUR NEGATIVE 07/26/2017 1000   BILIRUBINUR  NEGATIVE 07/26/2017 1000   KETONESUR NEGATIVE 07/26/2017 1000   PROTEINUR 100 (A) 07/26/2017 1000   NITRITE NEGATIVE 07/26/2017 1000   LEUKOCYTESUR TRACE (A) 07/26/2017 1000    Sepsis Labs: Lactic Acid, Venous    Component Value Date/Time   LATICACIDVEN 2.56 (HH) 09/07/2018 1130    MICROBIOLOGY: Recent Results (from the past 240 hour(s))  Blood Culture (routine x 2)     Status: None (Preliminary result)   Collection Time: 09/07/18  9:10 AM  Result Value Ref Range Status   Specimen Description BLOOD BLOOD RIGHT HAND  Final   Special Requests   Final    BOTTLES DRAWN AEROBIC AND ANAEROBIC Blood Culture adequate volume   Culture   Final    NO GROWTH 3 DAYS Performed at [facility], [place] [address] [place], [place] [address]    Report Status PENDING  Incomplete  Blood Culture (routine x 2)     Status: None (Preliminary result)   Collection Time: 09/07/18  9:15 AM  Result Value Ref Range Status   Specimen Description BLOOD BLOOD RIGHT HAND  Final   Special Requests   Final    BOTTLES DRAWN AEROBIC AND ANAEROBIC Blood Culture adequate volume   Culture   Final    NO GROWTH 3 DAYS Performed at [facility], [place] [address], [place], [place] [address]    Report Status PENDING  Incomplete  Culture, sputum-assessment     Status: None (Preliminary result)   Collection Time: 09/07/18  3:18 PM  Result Value Ref Range Status   Specimen Description SPU  Final   Special Requests NONE  Final   Sputum evaluation   Final    THIS SPECIMEN IS ACCEPTABLE FOR SPUTUM CULTURE Performed at [facility], [address] [place], [place] [address]    Report Status PENDING  Incomplete  Culture, respiratory     Status: None (Preliminary result)   Collection Time: 09/07/18  3:18 PM  Result Value Ref Range Status   Specimen Description SPU  Final   Special Requests NONE Reflexed from [id number]  Final   Gram Stain   Final    FEW WBC PRESENT, PREDOMINANTLY PMN RARE SQUAMOUS  EPITHELIAL CELLS PRESENT FEW GRAM POSITIVE COCCI FEW GRAM NEGATIVE RODS RARE GRAM POSITIVE RODS Performed at [facility], [address] [place], [place] [address]    Culture CULTURE REINCUBATED FOR BETTER GROWTH  Final   Report Status PENDING  Incomplete    RADIOLOGY STUDIES/RESULTS: Dg Chest 2 View  Result Date: 09/07/2018 CLINICAL DATA:  Shortness of breath and productive cough. EXAM: CHEST - 2 VIEW COMPARISON:  06/10/2018 FINDINGS: The patient has new bilateral upper lobe infiltrates and right middle or lower lobe infiltrate. Mild chronic cardiomegaly. Pulmonary vascularity is normal. No effusions. No acute bone abnormality. IMPRESSION: Bilateral pneumonia. Electronically Signed   By: Lorriane Shire M.D.   On: 09/07/2018 08:56   Dg Chest [place] 1v Same Day  Result Date: 09/09/2018 CLINICAL DATA:  Shortness of breath x 2-3 days, chest pain EXAM: PORTABLE CHEST 1 VIEW COMPARISON:  09/07/2018 FINDINGS: Improving multifocal interstitial/perihilar opacities, right infrahilar predominant. Appearance/distribution slightly favors improving interstitial edema over pneumonia. No definite pleural effusions. No pneumothorax. Cardiomegaly. IMPRESSION: Improving multifocal interstitial/perihilar opacities, favoring interstitial edema over pneumonia. No definite pleural effusions. Electronically Signed   By: Julian Hy M.D.   On: 09/09/2018 20:32     LOS: 3 days   Oren Binet, MD  [facility]  If 7PM-7AM, please contact night-coverage  Please page via [email or web address]-Password [email or web address]-click on MD name and type text message  09/10/2018, 12:33 PM

## 2018-09-10 NOTE — Progress Notes (Signed)
Please see in addition to PT note:   SATURATION QUALIFICATIONS: (This note is used to comply with regulatory documentation for home oxygen)  Patient Saturations on Room Air at Rest = 89-91%   Patient Saturations on Room Air while Ambulating = 84-85%  Patient Saturations on 2 Liters of oxygen while Ambulating = DNT, did require 3.5LPM O2 from wall to recover to 94%  Please briefly explain why patient needs home oxygen: SPO2 drop with activity on room air   Deniece Ree PT, DPT, CBIS  Supplemental Physical Therapist [place]    Pager [phone] Acute Rehab Office [phone]

## 2018-09-10 NOTE — Progress Notes (Addendum)
Progress Note  Patient Name: Priscilla Davis Date of Encounter: 09/10/2018  Primary Cardiologist: Skeet Latch, MD   Subjective   Pt feeling better today. SOB and BLE improved. Awaiting echocardiogram results. No chest pain.   Inpatient Medications    Scheduled Meds: . albuterol  5 mg Nebulization Once  . amLODipine  10 mg Oral Daily  . aspirin EC  81 mg Oral Daily  . azithromycin  250 mg Oral Daily  . carvedilol  12.5 mg Oral BID WC  . cholecalciferol  5,000 Units Oral Daily  . dorzolamide  1 drop Both Eyes BID  . [START ON 09/11/2018] furosemide  120 mg Intravenous BID  . gabapentin  200 mg Oral TID  . hydrALAZINE  50 mg Oral BID  . insulin aspart  0-9 Units Subcutaneous TID WC  . insulin detemir  15 Units Subcutaneous QHS  . latanoprost  1 drop Both Eyes QHS  . multivitamin with minerals  1 tablet Oral Daily  . pantoprazole  40 mg Oral Daily  . rosuvastatin  40 mg Oral QHS  . vitamin B-12  1,000 mcg Oral Daily   Continuous Infusions: . cefTRIAXone (ROCEPHIN)  IV 1 g (09/09/18 0849)  . heparin 1,200 Units/hr (09/10/18 0756)  . sodium chloride     PRN Meds: acetaminophen, levalbuterol **AND** ipratropium, nitroGLYCERIN   Vital Signs    Vitals:   09/09/18 1314 09/09/18 1458 09/09/18 2147 09/10/18 0414  BP: (!) 157/69 (!) 154/63 (!) 156/67 (!) 176/76  Pulse: 69 72 75 72  Resp:  18  20  Temp: 97.7 F (36.5 C) 98 F (36.7 C)  98.4 F (36.9 C)  TempSrc: Oral   Oral  SpO2: 93% 99% 93% 95%  Weight:      Height:        Intake/Output Summary (Last 24 hours) at 09/10/2018 1013 Last data filed at 09/10/2018 0600 Gross per 24 hour  Intake 654.5 ml  Output 4100 ml  Net -3445.5 ml   Filed Weights   09/07/18 0750  Weight: 99.8 kg   Physical Exam   General: Well developed, well nourished, NAD Skin: Warm, dry, intact  Head: Normocephalic, atraumatic, clear, moist mucus membranes. Neck: Negative for carotid bruits. No JVD Lungs:Clear to ausculation  bilaterally. No wheezes, rales, or rhonchi. Breathing is unlabored. Cardiovascular: RRR with S1 S2. No murmurs, rubs, gallops, or LV heave appreciated. Abdomen: Soft, non-tender, non-distended with normoactive bowel sounds. No obvious abdominal masses. MSK: Strength and tone appear normal for age. 5/5 in all extremities Extremities: No edema. No clubbing or cyanosis. DP/PT pulses 2+ bilaterally Neuro: Alert and oriented. No focal deficits. No facial asymmetry. MAE spontaneously. Psych: Responds to questions appropriately with normal affect.    Labs    Chemistry Recent Labs  Lab 09/08/18 0353 09/09/18 0327 09/10/18 0344  NA 141 142 141  K 4.0 4.4 4.0  CL 111 109 106  CO2 20* 22 23  GLUCOSE 84 50* 120*  BUN 49* 57* 68*  CREATININE 4.29* 4.58* 4.97*  CALCIUM 8.7* 8.8* 8.3*  ALBUMIN  --  3.0* 2.7*  GFRNONAA 10* 9* 8*  GFRAA 11* 11* 10*  ANIONGAP 10 11 12      Hematology Recent Labs  Lab 09/08/18 0353 09/09/18 0327 09/10/18 0344  WBC 18.1* 20.1* 16.1*  RBC 2.94* 3.46* 3.46*  HGB 7.9* 9.7* 9.6*  HCT 25.9* 31.1* 30.6*  MCV 88.1 89.9 88.4  MCH 26.9 28.0 27.7  MCHC 30.5 31.2 31.4  RDW 14.0  14.0 14.1  PLT 313 351 361    Cardiac Enzymes Recent Labs  Lab 09/08/18 1615 09/08/18 2230 09/09/18 0327  TROPONINI 1.19* 1.28* 1.27*    Recent Labs  Lab 09/07/18 0810  TROPIPOC 0.01     BNPNo results for input(s): BNP, PROBNP in the last 168 hours.   DDimer No results for input(s): DDIMER in the last 168 hours.   Radiology    Dg Chest [place] 1v Same Day  Result Date: 09/09/2018 CLINICAL DATA:  Shortness of breath x 2-3 days, chest pain EXAM: PORTABLE CHEST 1 VIEW COMPARISON:  09/07/2018 FINDINGS: Improving multifocal interstitial/perihilar opacities, right infrahilar predominant. Appearance/distribution slightly favors improving interstitial edema over pneumonia. No definite pleural effusions. No pneumothorax. Cardiomegaly. IMPRESSION: Improving multifocal  interstitial/perihilar opacities, favoring interstitial edema over pneumonia. No definite pleural effusions. Electronically Signed   By: Julian Hy M.D.   On: 09/09/2018 20:32   Telemetry    09/10/18 NSR- Personally Reviewed  ECG    Non new tracing as of 09/10/18- Personally Reviewed  Cardiac Studies   Echocardiogram: Pending   Patient Profile     68 y.o. female with DM, CKD IV, and asthma here with hypertensive urgency and NSTEMI.   Assessment & Plan    1. Elevated troponin: -Presented with several days of SOB and BLE>>>BP noted to be poorly controlled  -Trop elevated at 1.27>>with flat trend and no ACS symptoms>> thought to be in the setting of poorly controlled BP and fluid volume overload -176/76>156/67>154/63>157/63 -Currently on Hep gtt>>for 48H per chart review  -No plans to pursue cath unless undergoes HD given A>CKD stage V -Continue ASA, BB, statin   2. Hypertensive heart disease: -BP noted to be poorly controlled>>see above   -Continue amlodipine and carvedilol  -Hydralazine 50mg  twice daily added 09/09/17 -Losartan on hold given worsening creatinine   3. Acute on chronic CKD Stage V: -Creatinine worsening in the setting of above>>4.97 today with a baseline in the 4.0 range  -Will likely need HD if proceeding to cath  -Echocardiogram completed today with pending results  -Needs nephrology consultation>>>follows in the OP setting. Has AV fistula in left in anticiaption of HD  -On Lasix 120mg  twice daily>>consider decreasing Lasix dose given improved CXR and no fluid volume overload on exam   4. DM2: -On SSI for glucose control while inpatient status -Per IM    Signed, Kathyrn Drown NP-C [facility] Pager: [phone] 09/10/2018, 10:13 AM    Agree with note by Kathyrn Drown NP  Patient was admitted 09/07/2018 with shortness of breath.  She does have chronic renal insufficiency and has an AV fistula placed which was put in by Dr. fields late last  year.  She has hypertension and diabetes.  Her enzymes are low and flat.  She had no acute EKG changes.  2D echo does show an EF of 35% with global hypokinesia.  I think her enzyme leak was related to demand ischemia.  I do not think she needs a cardiac catheterization at this time but would benefit from outpatient Myoview stress testing.  Her exam is benign.   Lorretta Harp, M.D., [place], [facility], [place] [address] [place], [place]  [address]  [phone] 09/10/2018 11:15 AM  For questions or updates, please contact   Please consult [email or web address] for contact info under Cardiology/STEMI.

## 2018-09-10 NOTE — Consult Note (Signed)
Reason for Consult:CKD 5 Referring Physician: Dr. [name] is an 68 y.o. female.  HPI: 68 yr female with >16yr DM, HTN,  And progressive ^ Cr. Seen by Dr. Johnney Davis in office.  Admitted with DOB and cough. Cr 4.3 on admit and risen to 4.9 with diuresis.  Initially thought to have pneu but responded to diuretics so AB stopped.  No N, V, itching, ms cramping but bp up here. Sputum frothy.  SOB over 3 d PTA.  Has NSTEMI. Also.  Had AVG placed 10/22 by Dr. Oneida Davis.  Has ankle edema, noct x 2 at baseline. No CP. Chronic smoker Constitutional: only resp Eyes: negative Ears, nose, mouth, throat, and face: negative Respiratory: as above Cardiovascular: as above Gastrointestinal: ok now, freq bloating, indigest Genitourinary:as above Integument/breast: negative Hematologic/lymphatic: anemia Musculoskeletal:knee arth Neurological: negative Endocrine: DM Allergic/Immunologic: negative   . Primary Nephrologist Priscilla Davis.Marland Kitchen LLA AVG Past Medical History:  Diagnosis Date  . Arthritis   . Asthma   . Depression   . Diabetes mellitus without complication ([place])    Type II  . GERD (gastroesophageal reflux disease)   . Hypertension   . Renal disorder    CKD 5    Past Surgical History:  Procedure Laterality Date  . AV FISTULA PLACEMENT Left 06/19/2018   Procedure: INSERTION OF 4-7MM X 45CM ARTERIOVENOUS (AV) GORE-TEX GRAFT LEFT  ARM;  Surgeon: Priscilla Dutch, MD;  Location: [facility];  Service: Vascular;  Laterality: Left;  . CESAREAN SECTION    . CHOLECYSTECTOMY    . COLONOSCOPY    . EYE SURGERY Bilateral    diabetic   . 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 Peripheral vascular disease Mother   . Hypertension Mother     Social History:  reports that she has been smoking cigarettes. She has a 7.50 pack-year smoking history. She has never used smokeless tobacco. She reports current alcohol use. She reports that she does not use  drugs.  Allergies: No Known Allergies  Medications:  I have reviewed the patient's current medications. Prior to Admission:  Medications Prior to Admission  Medication Sig Dispense Refill Last Dose  . Acetaminophen 500 MG coapsule Take 1,000 mg by mouth every 6 (six) hours as needed for mild pain or moderate pain.    09/06/2018 at Unknown time  . albuterol (PROVENTIL HFA;VENTOLIN HFA) 108 (90 Base) MCG/ACT inhaler Inhale 2 puffs into the lungs every 6 (six) hours as needed for wheezing or shortness of breath.   09/06/2018 at prn  . amLODipine (NORVASC) 10 MG tablet Take 10 mg by mouth daily.   09/06/2018 at Unknown time  . aspirin EC 81 MG tablet Take 81 mg by mouth daily.   09/06/2018 at Unknown time  . Cholecalciferol (VITAMIN D) 125 MCG (5000 UT) CAPS Take 5,000 Units by mouth daily.    09/06/2018 at Unknown time  . dorzolamide (TRUSOPT) 2 % ophthalmic solution Place 1 drop into both eyes 2 (two) times daily.   09/06/2018 at Unknown time  . FLUoxetine (PROZAC) 20 MG capsule Take 20 mg by mouth daily.   09/06/2018 at Unknown time  . gabapentin (NEURONTIN) 400 MG capsule Take 400 mg by mouth 3 (three) times daily.    09/06/2018 at Unknown time  . insulin detemir (LEVEMIR) 100 UNIT/ML injection Inject 48 Units into the skin 2 (two) times daily.    09/06/2018 at Unknown time  .  latanoprost (XALATAN) 0.005 % ophthalmic solution Place 1 drop into both eyes at bedtime.   09/06/2018 at Unknown time  . liraglutide (VICTOZA) 18 MG/3ML SOPN Inject 1.8 mg into the skin every evening.    09/06/2018 at Unknown time  . losartan (COZAAR) 25 MG tablet Take 25 mg by mouth daily.   09/06/2018 at Unknown time  . metoprolol tartrate (LOPRESSOR) 25 MG tablet Take 25 mg by mouth 2 (two) times daily.   09/06/2018 at 2100  . Multiple Vitamin (MULTIVITAMIN WITH MINERALS) TABS tablet Take 1 tablet by mouth daily. woman's   09/06/2018 at Unknown time  . omeprazole (PRILOSEC) 20 MG capsule Take 40 mg by mouth daily.    09/06/2018 at Unknown time   . ranitidine (ZANTAC) 150 MG tablet Take 150 mg by mouth daily.   09/06/2018 at Unknown time  . Rosuvastatin Calcium 40 MG CPSP Take 40 mg by mouth at bedtime.    09/06/2018 at Unknown time  . vitamin B-12 (CYANOCOBALAMIN) 1000 MCG tablet Take 1,000 mcg by mouth daily.   09/06/2018 at Unknown time   Results for orders placed or performed during the hospital encounter of 09/07/18 (from the past 48 hour(s))  Troponin I - Now Then Q6H     Status: Abnormal   Collection Time: 09/08/18  4:15 PM  Result Value Ref Range   Troponin I 1.19 (HH) <0.03 ng/mL    Comment: CRITICAL RESULT CALLED TO, READ BACK BY AND VERIFIED WITH: Priscilla, C RN @ 1721 ON 09/08/2018 BY TEMOCHE, H Performed at [facility], [address], [place], [place] [address]   Glucose, capillary     Status: None   Collection Time: 09/08/18  6:05 PM  Result Value Ref Range   Glucose-Capillary 91 70 - 99 mg/dL  Type and screen     Status: None   Collection Time: 09/08/18  6:58 PM  Result Value Ref Range   ABO/RH(D) A POS    Antibody Screen NEG    Sample Expiration 09/11/2018    Unit Number [id number]    Blood Component Type RED CELLS,LR    Unit division 00    Status of Unit ISSUED,FINAL    Transfusion Status OK TO TRANSFUSE    Crossmatch Result      Compatible Performed at [facility], [place] [address], [place], [place] [address]   ABO/Rh     Status: None   Collection Time: 09/08/18  6:58 PM  Result Value Ref Range   ABO/RH(D)      A POS Performed at [facility] [address] [place], [place] [address]   Prepare RBC     Status: None   Collection Time: 09/08/18  6:58 PM  Result Value Ref Range   Order Confirmation      ORDER PROCESSED BY BLOOD BANK Performed at [facility], [place] [address] [place], [place] [address]   Glucose, capillary     Status: Abnormal   Collection Time: 09/08/18  9:55 PM  Result Value Ref Range   Glucose-Capillary 101 (H) 70 - 99 mg/dL  Troponin I - Now Then Q6H      Status: Abnormal   Collection Time: 09/08/18 10:30 PM  Result Value Ref Range   Troponin I 1.28 (HH) <0.03 ng/mL    Comment: CRITICAL VALUE NOTED.  VALUE IS CONSISTENT WITH PREVIOUSLY REPORTED AND CALLED VALUE. Performed at [facility], [place] [address], [place], [place] [address]   CBC  Status: Abnormal   Collection Time: 09/09/18  3:27 AM  Result Value Ref Range   WBC 20.1 (H) 4.0 - 10.5 K/uL   RBC 3.46 (L) 3.87 - 5.11 MIL/uL   Hemoglobin 9.7 (L) 12.0 - 15.0 g/dL   HCT 31.1 (L) 36.0 - 46.0 %   MCV 89.9 80.0 - 100.0 fL   MCH 28.0 26.0 - 34.0 pg   MCHC 31.2 30.0 - 36.0 g/dL   RDW 14.0 11.5 - 15.5 %   Platelets 351 150 - 400 K/uL   nRBC 0.0 0.0 - 0.2 %    Comment: Performed at [facility], [place] [address] [place], [place] [address]  Renal function panel     Status: Abnormal   Collection Time: 09/09/18  3:27 AM  Result Value Ref Range   Sodium 142 135 - 145 mmol/L   Potassium 4.4 3.5 - 5.1 mmol/L   Chloride 109 98 - 111 mmol/L   CO2 22 22 - 32 mmol/L   Glucose, Bld 50 (L) 70 - 99 mg/dL   BUN 57 (H) 8 - 23 mg/dL   Creatinine, Ser 4.58 (H) 0.44 - 1.00 mg/dL   Calcium 8.8 (L) 8.9 - 10.3 mg/dL   Phosphorus 4.3 2.5 - 4.6 mg/dL   Albumin 3.0 (L) 3.5 - 5.0 g/dL   GFR calc non Af Amer 9 (L) >60 mL/min   GFR calc Af Amer 11 (L) >60 mL/min   Anion gap 11 5 - 15    Comment: Performed at [facility] [address] [place], [place] [address]  Magnesium     Status: None   Collection Time: 09/09/18  3:27 AM  Result Value Ref Range   Magnesium 2.0 1.7 - 2.4 mg/dL    Comment: Performed at [facility] [address], [place],  [address]  Troponin I - Tomorrow AM 0500     Status: Abnormal   Collection Time: 09/09/18  3:27 AM  Result Value Ref Range   Troponin I 1.27 (HH) <0.03 ng/mL    Comment: CRITICAL VALUE NOTED.  VALUE IS CONSISTENT WITH PREVIOUSLY REPORTED AND CALLED VALUE. Performed at [facility], [place] [address], [place], [place]  [address]   Heparin level (unfractionated)     Status: Abnormal   Collection Time: 09/09/18  3:27 AM  Result Value Ref Range   Heparin Unfractionated 0.28 (L) 0.30 - 0.70 IU/mL    Comment: (NOTE) If heparin results are below expected values, and patient dosage has  been confirmed, suggest follow up testing of antithrombin III levels. Performed at [facility], [place] [address], [place], [place] [address]   Glucose, capillary     Status: Abnormal   Collection Time: 09/09/18  4:35 AM  Result Value Ref Range   Glucose-Capillary 35 (LL) 70 - 99 mg/dL   Comment 1 Notify RN   Glucose, capillary     Status: Abnormal   Collection Time: 09/09/18  5:01 AM  Result Value Ref Range   Glucose-Capillary 56 (L) 70 - 99 mg/dL  Glucose, capillary     Status: None   Collection Time: 09/09/18  5:30 AM  Result Value Ref Range   Glucose-Capillary 72 70 - 99 mg/dL  Glucose, capillary     Status: Abnormal   Collection Time: 09/09/18  8:08 AM  Result Value Ref Range   Glucose-Capillary 101 (H) 70 - 99 mg/dL  Heparin level (unfractionated)     Status: None  Collection Time: 09/09/18 11:52 AM  Result Value Ref Range   Heparin Unfractionated 0.61 0.30 - 0.70 IU/mL    Comment: (NOTE) If heparin results are below expected values, and patient dosage has  been confirmed, suggest follow up testing of antithrombin III levels. Performed at [facility], [place] [address], [place], [place] [address]   Glucose, capillary     Status: None   Collection Time: 09/09/18 11:54 AM  Result Value Ref Range   Glucose-Capillary 95 70 - 99 mg/dL  Glucose, capillary     Status: Abnormal   Collection Time: 09/09/18  4:58 PM  Result Value Ref Range   Glucose-Capillary 132 (H) 70 - 99 mg/dL  Heparin level (unfractionated)     Status: None   Collection Time: 09/09/18  7:25 PM  Result Value Ref Range   Heparin Unfractionated 0.66 0.30 - 0.70 IU/mL    Comment: (NOTE) If heparin results are below expected values, and  patient dosage has  been confirmed, suggest follow up testing of antithrombin III levels. Performed at [facility], [place] [address] [place], [place] [address]   Glucose, capillary     Status: Abnormal   Collection Time: 09/09/18  9:41 PM  Result Value Ref Range   Glucose-Capillary 165 (H) 70 - 99 mg/dL  Heparin level (unfractionated)     Status: None   Collection Time: 09/10/18  3:44 AM  Result Value Ref Range   Heparin Unfractionated 0.64 0.30 - 0.70 IU/mL    Comment: (NOTE) If heparin results are below expected values, and patient dosage has  been confirmed, suggest follow up testing of antithrombin III levels. Performed at [facility], [place] [address] [place], [place] [address]   CBC     Status: Abnormal   Collection Time: 09/10/18  3:44 AM  Result Value Ref Range   WBC 16.1 (H) 4.0 - 10.5 K/uL   RBC 3.46 (L) 3.87 - 5.11 MIL/uL   Hemoglobin 9.6 (L) 12.0 - 15.0 g/dL   HCT 30.6 (L) 36.0 - 46.0 %   MCV 88.4 80.0 - 100.0 fL   MCH 27.7 26.0 - 34.0 pg   MCHC 31.4 30.0 - 36.0 g/dL   RDW 14.1 11.5 - 15.5 %   Platelets 361 150 - 400 K/uL   nRBC 0.0 0.0 - 0.2 %    Comment: Performed at [facility], [place] [address], [place], [place] [address]  Renal function panel     Status: Abnormal   Collection Time: 09/10/18  3:44 AM  Result Value Ref Range   Sodium 141 135 - 145 mmol/L   Potassium 4.0 3.5 - 5.1 mmol/L   Chloride 106 98 - 111 mmol/L   CO2 23 22 - 32 mmol/L   Glucose, Bld 120 (H) 70 - 99 mg/dL   BUN 68 (H) 8 - 23 mg/dL   Creatinine, Ser 4.97 (H) 0.44 - 1.00 mg/dL   Calcium 8.3 (L) 8.9 - 10.3 mg/dL   Phosphorus 4.1 2.5 - 4.6 mg/dL   Albumin 2.7 (L) 3.5 - 5.0 g/dL   GFR calc non Af Amer 8 (L) >60 mL/min   GFR calc Af Amer 10 (L) >60 mL/min   Anion gap 12 5 - 15    Comment: Performed at [facility] [address] [place], [place] [address]  Lipid panel     Status: None   Collection Time: 09/10/18  3:44 AM  Result Value Ref Range  Cholesterol  102 0 - 200 mg/dL   Triglycerides 60 <150 mg/dL   HDL 45 >40 mg/dL   Total CHOL/HDL Ratio 2.3 RATIO   VLDL 12 0 - 40 mg/dL   LDL Cholesterol 45 0 - 99 mg/dL    Comment:        Total Cholesterol/HDL:CHD Risk Coronary Heart Disease Risk Table                     Men   Women  1/2 Average Risk   3.4   3.3  Average Risk       5.0   4.4  2 X Average Risk   9.6   7.1  3 X Average Risk  23.4   11.0        Use the calculated Patient Ratio above and the CHD Risk Table to determine the patient's CHD Risk.        ATP III CLASSIFICATION (LDL):  <100     mg/dL   Optimal  100-129  mg/dL   Near or Above                    Optimal  130-159  mg/dL   Borderline  160-189  mg/dL   High  >190     mg/dL   Very High Performed at [facility] [address], [place], [place] [address]   Glucose, capillary     Status: None   Collection Time: 09/10/18  6:04 AM  Result Value Ref Range   Glucose-Capillary 87 70 - 99 mg/dL  Glucose, capillary     Status: Abnormal   Collection Time: 09/10/18  7:53 AM  Result Value Ref Range   Glucose-Capillary 120 (H) 70 - 99 mg/dL  Glucose, capillary     Status: Abnormal   Collection Time: 09/10/18 11:55 AM  Result Value Ref Range   Glucose-Capillary 111 (H) 70 - 99 mg/dL    Dg Chest Port 1v Same Day  Result Date: 09/09/2018 CLINICAL DATA:  Shortness of breath x 2-3 days, chest pain EXAM: PORTABLE CHEST 1 VIEW COMPARISON:  09/07/2018 FINDINGS: Improving multifocal interstitial/perihilar opacities, right infrahilar predominant. Appearance/distribution slightly favors improving interstitial edema over pneumonia. No definite pleural effusions. No pneumothorax. Cardiomegaly. IMPRESSION: Improving multifocal interstitial/perihilar opacities, favoring interstitial edema over pneumonia. No definite pleural effusions. Electronically Signed   By: Julian Hy M.D.   On: 09/09/2018 20:32    ROS Blood pressure (!) 161/76, pulse 72, temperature 98.4 F (36.9 Davis),  temperature source Oral, resp. rate 20, height 5\' 8"  (1.727 m), weight 99.8 kg, SpO2 95 %. Physical Exam Physical Examination: General appearance - alert, well appearing, and in no distress Mental status - alert, oriented to person, place, and time Eyes - pupils equal and reactive, extraocular eye movements intact, funduscopic exam normal, discs flat and sharp Mouth - mucous membranes moist, pharynx normal without lesions Neck - adenopathy noted PCL Lymphatics - posterior cervical nodes Chest - rales noted bibasilar, decreased air entry noted bilat Heart - S1 and S2 normal, systolic murmur EY8/1 at 2nd left intercostal space Abdomen - pos bs, soft, liver down 4 cm Extremities - pedal edema 1-2 +, AVG LLA B&T Skin - normal coloration and turgor, no rashes, no suspicious skin lesions noted  Assessment/Plan: 1 Vol xs secondary to CKD, diet, progressive dz, ? Cardiac cause.  Responding to diuretics.  Can use po 2 CKD5  Mild worsening , not uremic but will need close f/u  3 Hypertension: lower vol, lower meds 4. Anemia eval 5. Metabolic Bone Disease: check PTh 6 DM controlled 7 Smoking counseled 8 NsTEMI per cards P PTH, Fe, esa. Po Lasix, follow Cr  Priscilla Davis Priscilla Davis 09/10/2018, 3:49 PM

## 2018-09-10 NOTE — Progress Notes (Signed)
Pharmacist Heart Failure Core Measure Documentation  Assessment: Priscilla Davis has an EF documented as 35-40% on 09/10/2018 by ECHO.  Rationale: Heart failure patients with left ventricular systolic dysfunction (LVSD) and an EF < 40% should be prescribed an angiotensin converting enzyme inhibitor (ACEI) or angiotensin receptor blocker (ARB) at discharge unless a contraindication is documented in the medical record.  This patient is not currently on an ACEI or ARB for HF.  This note is being placed in the record in order to provide documentation that a contraindication to the use of these agents is present for this encounter.  ACE Inhibitor or Angiotensin Receptor Blocker is contraindicated (specify all that apply)  []   ACEI allergy AND ARB allergy []   Angioedema []   Moderate or severe aortic stenosis []   Hyperkalemia []   Hypotension []   Renal artery stenosis [x]   Worsening renal function, preexisting renal disease or dysfunction   [name], PharmD, BCCCP Clinical Pharmacist  Pager: [phone] Phone: [phone]  09/10/2018 11:36 AM

## 2018-09-11 ENCOUNTER — Other Ambulatory Visit: Payer: Self-pay | Admitting: Cardiology

## 2018-09-11 DIAGNOSIS — I5021 Acute systolic (congestive) heart failure: Secondary | ICD-10-CM

## 2018-09-11 DIAGNOSIS — I519 Heart disease, unspecified: Secondary | ICD-10-CM

## 2018-09-11 LAB — RENAL FUNCTION PANEL
Albumin: 2.5 g/dL — ABNORMAL LOW (ref 3.5–5.0)
Anion gap: 11 (ref 5–15)
BUN: 73 mg/dL — ABNORMAL HIGH (ref 8–23)
CO2: 23 mmol/L (ref 22–32)
CREATININE: 5.26 mg/dL — AB (ref 0.44–1.00)
Calcium: 8.2 mg/dL — ABNORMAL LOW (ref 8.9–10.3)
Chloride: 106 mmol/L (ref 98–111)
GFR calc Af Amer: 9 mL/min — ABNORMAL LOW (ref >60)
GFR calc non Af Amer: 8 mL/min — ABNORMAL LOW (ref >60)
Glucose, Bld: 128 mg/dL — ABNORMAL HIGH (ref 70–99)
Phosphorus: 5.1 mg/dL — ABNORMAL HIGH (ref 2.5–4.6)
Potassium: 4 mmol/L (ref 3.5–5.1)
Sodium: 140 mmol/L (ref 135–145)

## 2018-09-11 LAB — CULTURE, RESPIRATORY W GRAM STAIN: Culture: NORMAL

## 2018-09-11 LAB — CBC
HCT: 30.7 % — ABNORMAL LOW (ref 36.0–46.0)
Hemoglobin: 9.5 g/dL — ABNORMAL LOW (ref 12.0–15.0)
MCH: 27.5 pg (ref 26.0–34.0)
MCHC: 30.9 g/dL (ref 30.0–36.0)
MCV: 88.7 fL (ref 80.0–100.0)
PLATELETS: 320 10*3/uL (ref 150–400)
RBC: 3.46 MIL/uL — ABNORMAL LOW (ref 3.87–5.11)
RDW: 14 % (ref 11.5–15.5)
WBC: 13.7 10*3/uL — ABNORMAL HIGH (ref 4.0–10.5)
nRBC: 0 % (ref 0.0–0.2)

## 2018-09-11 LAB — GLUCOSE, CAPILLARY
Glucose-Capillary: 71 mg/dL (ref 70–99)
Glucose-Capillary: 112 mg/dL — ABNORMAL HIGH (ref 70–99)
Glucose-Capillary: 153 mg/dL — ABNORMAL HIGH (ref 70–99)
Glucose-Capillary: 152 mg/dL — ABNORMAL HIGH (ref 70–99)

## 2018-09-11 LAB — PARATHYROID HORMONE, INTACT (NO CA): PTH: 52 pg/mL (ref 15–65)

## 2018-09-11 MED ORDER — GABAPENTIN 300 MG PO CAPS
300.0000 mg | ORAL_CAPSULE | Freq: Every day | ORAL | Status: DC
  Administered 2018-09-11 – 2018-09-12 (×2): 300 mg via ORAL
  Filled 2018-09-11 (×2): qty 1

## 2018-09-11 MED ORDER — HEPARIN SODIUM (PORCINE) 5000 UNIT/ML IJ SOLN
5000.0000 [IU] | Freq: Three times a day (TID) | INTRAMUSCULAR | Status: DC
  Administered 2018-09-11 – 2018-09-12 (×3): 5000 [IU] via SUBCUTANEOUS
  Filled 2018-09-11 (×3): qty 1

## 2018-09-11 MED ORDER — SODIUM CHLORIDE 0.9 % IV SOLN
510.0000 mg | Freq: Once | INTRAVENOUS | Status: AC
  Administered 2018-09-11: 510 mg via INTRAVENOUS
  Filled 2018-09-11: qty 17

## 2018-09-11 NOTE — Progress Notes (Signed)
SATURATION QUALIFICATIONS: (This note is used to comply with regulatory documentation for home oxygen)  Patient Saturations on Room Air at Rest = 96%  Patient Saturations on Room Air while Ambulating = 89%  Patient Saturations on 0 Liters of oxygen while Ambulating = 89%  Please briefly explain why patient needs home oxygen:  Pt does not need supplemental O2.  Able to maintain SPO2 89% or greater during gait training.  With seated rest break quickly returns to 92%.  Pt educated on pacing to self recover saturations.    Governor Rooks, PTA Acute Rehabilitation Services Pager [phone] Office [phone]

## 2018-09-11 NOTE — Progress Notes (Signed)
Subjective: Interval History: has no complaint, breathing much better.  Objective: Vital signs in last 24 hours: Temp:  [98.7 F (37.1 C)] 98.7 F (37.1 C) (01/13 2203) Pulse Rate:  [72-76] 76 (01/14 0857) Resp:  [16] 16 (01/13 2203) BP: (148-161)/(63-76) 148/64 (01/14 0857) SpO2:  [92 %-100 %] 94 % (01/14 0900) Weight:  [102.1 kg] 102.1 kg (01/14 0450) Weight change:   Intake/Output from previous day: 01/13 0701 - 01/14 0700 In: 156 [I.V.:156] Out: -  Intake/Output this shift: Total I/O In: -  Out: 700 [Urine:700]  General appearance: alert, cooperative and no distress Resp: rales LLL Cardio: S1, S2 normal and systolic murmur: systolic ejection 2/6, crescendo and decrescendo at 2nd left intercostal space GI: soft,liver down 4 cm, nontender Extremities: edema 2+, AVG LLA  Lab Results: Recent Labs    09/10/18 0344 09/11/18 0344  WBC 16.1* 13.7*  HGB 9.6* 9.5*  HCT 30.6* 30.7*  PLT 361 320   BMET:  Recent Labs    09/10/18 0344 09/11/18 0344  NA 141 140  K 4.0 4.0  CL 106 106  CO2 23 23  GLUCOSE 120* 128*  BUN 68* 73*  CREATININE 4.97* 5.26*  CALCIUM 8.3* 8.2*   Recent Labs    09/10/18 1614  PTH 52   Iron Studies:  Recent Labs    09/10/18 1614  IRON 17*  TIBC 206*    Studies/Results: No results found.  I have reviewed the patient's current medications.  Assessment/Plan: 1 CKD not uremic or acidemic. Vol improving Cr rising, can f/u out patient when ok with primary 2 NSTEMI ? Vol related 3 HTN better with lower vol 4 DM controlled 5 Anemia needs iv Fe 6 HPTH controlled P HD, esa, lasix, Ok to d/c from our standpoint and f/u n 10d   LOS: 4 days   Priscilla Davis 09/11/2018,12:43 PM

## 2018-09-11 NOTE — Progress Notes (Addendum)
Progress Note  Patient Name: Priscilla Davis Date of Encounter: 09/11/2018  Primary Cardiologist: Skeet Latch, MD  Subjective   Pt doing well. Breathing improved. Continues to diurese. Denies chest pain.   Inpatient Medications    Scheduled Meds: . albuterol  5 mg Nebulization Once  . amLODipine  10 mg Oral QHS  . aspirin EC  81 mg Oral Daily  . carvedilol  12.5 mg Oral BID WC  . cefdinir  300 mg Oral Daily  . cholecalciferol  5,000 Units Oral Daily  . dorzolamide  1 drop Both Eyes BID  . furosemide  160 mg Oral BID  . gabapentin  200 mg Oral TID  . hydrALAZINE  50 mg Oral BID  . insulin aspart  0-9 Units Subcutaneous TID WC  . insulin detemir  15 Units Subcutaneous QHS  . latanoprost  1 drop Both Eyes QHS  . multivitamin with minerals  1 tablet Oral Daily  . pantoprazole  40 mg Oral Daily  . rosuvastatin  40 mg Oral QHS  . vitamin B-12  1,000 mcg Oral Daily   Continuous Infusions: . sodium chloride     PRN Meds: acetaminophen, levalbuterol **AND** ipratropium, nitroGLYCERIN   Vital Signs    Vitals:   09/10/18 1458 09/10/18 2203 09/11/18 0450 09/11/18 0800  BP: (!) 161/76 (!) 154/63    Pulse:  72    Resp:  16    Temp:  98.7 F (37.1 C)    TempSrc:  Oral    SpO2:  100%  92%  Weight:   102.1 kg   Height:        Intake/Output Summary (Last 24 hours) at 09/11/2018 0817 Last data filed at 09/11/2018 0713 Gross per 24 hour  Intake 155.96 ml  Output 700 ml  Net -544.04 ml   Filed Weights   09/07/18 0750 09/11/18 0450  Weight: 99.8 kg 102.1 kg    Physical Exam   General: Well developed, well nourished, NAD Skin: Warm, dry, intact  Head: Normocephalic, atraumatic, clear, moist mucus membranes. Neck: Negative for carotid bruits. No JVD Lungs:Clear to ausculation bilaterally. No wheezes, rales, or rhonchi. Breathing is unlabored. Cardiovascular: RRR with S1 S2. No murmurs, rubs, gallops, or LV heave appreciated. Abdomen: Soft, non-tender,  non-distended with normoactive bowel sounds. No obvious abdominal masses. MSK: Strength and tone appear normal for age. 5/5 in all extremities Extremities: No edema. No clubbing or cyanosis. DP/PT pulses 2+ bilaterally Neuro: Alert and oriented. No focal deficits. No facial asymmetry. MAE spontaneously. Psych: Responds to questions appropriately with normal affect.    Labs    Chemistry Recent Labs  Lab 09/09/18 0327 09/10/18 0344 09/11/18 0344  NA 142 141 140  K 4.4 4.0 4.0  CL 109 106 106  CO2 22 23 23   GLUCOSE 50* 120* 128*  BUN 57* 68* 73*  CREATININE 4.58* 4.97* 5.26*  CALCIUM 8.8* 8.3* 8.2*  ALBUMIN 3.0* 2.7* 2.5*  GFRNONAA 9* 8* 8*  GFRAA 11* 10* 9*  ANIONGAP 11 12 11      Hematology Recent Labs  Lab 09/09/18 0327 09/10/18 0344 09/11/18 0344  WBC 20.1* 16.1* 13.7*  RBC 3.46* 3.46* 3.46*  HGB 9.7* 9.6* 9.5*  HCT 31.1* 30.6* 30.7*  MCV 89.9 88.4 88.7  MCH 28.0 27.7 27.5  MCHC 31.2 31.4 30.9  RDW 14.0 14.1 14.0  PLT 351 361 320    Cardiac Enzymes Recent Labs  Lab 09/08/18 1615 09/08/18 2230 09/09/18 0327  TROPONINI 1.19* 1.28* 1.27*  Recent Labs  Lab 09/07/18 0810  TROPIPOC 0.01     BNPNo results for input(s): BNP, PROBNP in the last 168 hours.   DDimer No results for input(s): DDIMER in the last 168 hours.   Radiology    Dg Chest [place] 1v Same Day  Result Date: 09/09/2018 CLINICAL DATA:  Shortness of breath x 2-3 days, chest pain EXAM: PORTABLE CHEST 1 VIEW COMPARISON:  09/07/2018 FINDINGS: Improving multifocal interstitial/perihilar opacities, right infrahilar predominant. Appearance/distribution slightly favors improving interstitial edema over pneumonia. No definite pleural effusions. No pneumothorax. Cardiomegaly. IMPRESSION: Improving multifocal interstitial/perihilar opacities, favoring interstitial edema over pneumonia. No definite pleural effusions. Electronically Signed   By: Julian Hy M.D.   On: 09/09/2018 20:32   Telemetry     09/11/2018 NSR - Personally Reviewed  ECG    No new tracing as of 09/11/18- Personally Reviewed  Cardiac Studies   Echocardiogram: 09/10/18  Study Conclusions  - Left ventricle: The cavity size was normal. Wall thickness was   increased in a pattern of mild LVH. Systolic function was   moderately reduced. The estimated ejection fraction was in the   range of 35% to 40%. Diffuse hypokinesis. Doppler parameters are   consistent with abnormal left ventricular relaxation (grade 1   diastolic dysfunction). The E/e&' ratio is between 8-15,   suggesting indeterminate LV filling pressure. - Aortic valve: Sclerosis without stenosis. There was no   regurgitation. - Mitral valve: Mildly thickened leaflets . There was trivial   regurgitation. - Left atrium: Moderately dilated. - Inferior vena cava: The vessel was normal in size. The   respirophasic diameter changes were in the normal range (>= 50%),   consistent with normal central venous pressure.  Impressions:  - LVEF 35-40%, mild LVH, global hypokinesis, grade 1 DD,   indeterminate LV filling pressure, trivial MR, moderate LAE,   normal IVC.  Patient Profile     68 y.o. female DM, CKD IV, and asthma here with hypertensive urgency and NSTEMI.  Assessment & Plan    1. Elevated troponin with new systolic dysfunction: -Presented with several days of SOB and BLE>>>BP noted to be poorly controlled  -Trop elevated at 1.27>>with flat trend and no ACS symptoms>> thought to be in the setting of poorly controlled BP and fluid volume overload -154/63>161/76>176/76 -No plans to pursue cath unless undergoes HD given A>CKD stage V -Echocardiogram 01/13 with LVEF of 35% to 40% with diffuse hypokinesis and G1DD -No room for HF medications in the setting of renal dysfunction>>>could consider Bidil as only option  -Continue ASA, BB, statin   2. Hypertensive heart disease: -BP noted to be poorly controlled>>see above   -Continue  amlodipine and carvedilol  -Hydralazine 50mg  twice daily added 09/09/17 -Losartan on hold given worsening creatinine   3. 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 -Has AV fistula in left in anticiaption of HD  -Weight, 225lb today, 220lb on admission  -I&O, net negative 5.5L since admission   4. DM2: -On SSI for glucose control while inpatient status -Per IM   Signed, Kathyrn Drown NP-C [facility] Pager: [phone] 09/11/2018, 8:17 AM    Agree with note by Kathyrn Drown NP   Ms. Quay Burow admission was related to volume overload and progressive renal insufficiency.  She does have moderate LV dysfunction.  She is diuresed 5 L on diuretics.  She does have chronic renal insufficiency.  Her exam is benign.  She has no peripheral edema.  Plan is for discharge home with close outpatient follow-up.  Given her degree of LV dysfunction she will need a outpatient functional study which we will arrange.  Lorretta Harp, M.D., [place], [facility], [facility] [address] [place], [place]  [id number]  [phone] 09/11/2018 9:32 AM  For questions or updates, please contact   Please consult [email or web address] for contact info under Cardiology/STEMI.

## 2018-09-11 NOTE — Progress Notes (Signed)
Physical Therapy Treatment Patient Details Name: Priscilla Davis MRN: [id number] DOB: 1951/05/13 Today's Date: 09/11/2018    History of Present Illness 68yo female presenting with 2 days of cough with green sputum. Admitted for B lob bacterial pneumonia. 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  Pt at rest 96% on RA.  Pt performed gait training and maintained 90%-94% until last 10 feet of gait training and decreased to 89%.  Pt able to rest and recover SPO2 to greater than 90%.  Plan next session for stair training.  Informed CM nurse that patient will no require O2 at d/c.      Follow Up Recommendations  Home health PT     Equipment Recommendations  3in1 (PT)    Recommendations for Other Services       Precautions / Restrictions Precautions Precautions: Other (comment) Precaution Comments: watch SPO2  Restrictions Weight Bearing Restrictions: No    Mobility  Bed Mobility Overal bed mobility: Needs Assistance Bed Mobility: Supine to Sit     Supine to sit: Modified independent (Device/Increase time)        Transfers Overall transfer level: Needs assistance Equipment used: Straight cane Transfers: Sit to/from Stand Sit to Stand: Supervision         General transfer comment: Cues for safety with use of SPC.    Ambulation/Gait Ambulation/Gait assistance: Min guard Gait Distance (Feet): 120 Feet Assistive device: Straight cane Gait Pattern/deviations: Step-through pattern;Decreased step length - left;Decreased stance time - right;Decreased stride length;Trunk flexed Gait velocity: decreased    General Gait Details: Pt able to maintain SPO2 89%-96% on RA during tx.  Cues for pacing and pursed lip breathing.  X 2 rest periods to check SPO2.     Stairs             Wheelchair Mobility    Modified Rankin (Stroke Patients Only)       Balance     Sitting balance-Leahy Scale: Good       Standing balance-Leahy Scale: Fair                              Cognition Arousal/Alertness: Awake/alert Behavior During Therapy: WFL for tasks assessed/performed Overall Cognitive Status: Within Functional Limits for tasks assessed                                        Exercises      General Comments        Pertinent Vitals/Pain Pain Assessment: No/denies pain    Home Living                      Prior Function            PT Goals (current goals can now be found in the care plan section) Acute Rehab PT Goals Patient Stated Goal: feel better, go home  Potential to Achieve Goals: Good Progress towards PT goals: Progressing toward goals    Frequency    Min 3X/week      PT Plan Current plan remains appropriate    Co-evaluation              AM-PAC PT "6 Clicks" Mobility   Outcome Measure  Help needed  turning from your back to your side while in a flat bed without using bedrails?: None Help needed moving from lying on your back to sitting on the side of a flat bed without using bedrails?: None Help needed moving to and from a bed to a chair (including a wheelchair)?: A Little Help needed standing up from a chair using your arms (e.g., wheelchair or bedside chair)?: A Little Help needed to walk in hospital room?: A Little Help needed climbing 3-5 steps with a railing? : A Little 6 Click Score: 20    End of Session Equipment Utilized During Treatment: Gait belt Activity Tolerance: Patient tolerated treatment well Patient left: in bed;with call bell/phone within reach Nurse Communication: Mobility status;Other (comment)(SPO2 walking) PT Visit Diagnosis: Unsteadiness on feet (R26.81);Muscle weakness (generalized) (M62.81);Difficulty in walking, not elsewhere classified (R26.2)     Time: 1000-1016 PT Time Calculation (min) (ACUTE ONLY): 16 min  Charges:  $Gait Training:  8-22 mins                     Governor Rooks, PTA Acute Rehabilitation Services Pager [phone] Office [phone]     Priscilla Davis Priscilla Davis 09/11/2018, 11:28 AM

## 2018-09-11 NOTE — Progress Notes (Signed)
PROGRESS NOTE        PATIENT DETAILS Name: Priscilla Davis Age: 68 y.o. Sex: female Date of Birth: 1951/05/08 Admit Date: 09/07/2018 Admitting Physician Barb Merino, MD PZW:CHENI, Beryle Lathe, FNP  Brief Narrative: Patient is a 68 y.o. female with history of insulin-dependent DM-2, stage IV CKD, asthma-presented with 2-day history of cough, and worsening exertional dyspnea.  Initially thought to have pneumonia-but upon further evaluation-not thought to have decompensated systolic heart failure.  Hospital course complicated by development of non-STEMI.  See below for further   Subjective: Much improved-titrated off oxygen this morning-hardly any cough.  Although improved-she still has some mild exertional dyspnea-and not at baseline.  Assessment/Plan: Acute hypoxic respiratory failure secondary to decompensated systolic heart failure (EF 35-40% by TTE on 09/10/2018) in the setting of worsening CKD stage IV: Much improved-initially thought to have pneumonia-but now thought to have more of decompensated heart failure-dramatic response to diuretics.  Titrated off oxygen this morning--continue diuretics-we will watch for 1 more day-as she still has some mild exertional dyspnea and not yet back to baseline-if clinical improvement continues-we will discharge on 1/15.  Last day of antimicrobial therapy is 1/14-does not require antibiotics and discharge.  Continue beta-blocker-not a candidate for ACE inhibitor due to advanced CKD.  AKI on CKD stage IV: Likely progressive renal failure-nontraumatic response to diuretics-appreciate nephrology evaluation-we will discharge on oral Lasix-and will need very close outpatient follow-up with nephrology-suspect will require initiation of dialysis in the near future.    Non-STEMI: No further chest pain-continue aspirin, statin, beta-blocker-no longer on IV heparin.  Cardiology recommending outpatient nuclear stress test  Insulin-dependent DM-2  hypoglycemia: No further hypoglycemic episodes overnight-continue Levemir 15 units and SSI.  Anemia: Secondary to CKD-worsened by acute illness-transfused 1 unit of PRBC on 1/11-as hemoglobin was less than 8 and patient was having non-STEMI.  Hemoglobin currently stable  Hypertension: Moderately controlled-continue with amlodipine, metoprolol, hydralazine and IV Lasix.   Dyslipidemia: Continue statin  Asthma: Stable-continue bronchodilators.  Tobacco abuse: Counseled  DVT Prophylaxis: SQ heparin  Code Status: Full code   Family Communication: None at bedside  Disposition Plan Remain inpatient-if clinical improvement continues-home on 1/15  Antimicrobial agents: Anti-infectives (From admission, onward)   Start     Dose/Rate Route Frequency Ordered Stop   09/10/18 1345  cefdinir (OMNICEF) capsule 300 mg     300 mg Oral Daily 09/10/18 1330 09/11/18 1032   09/08/18 1000  cefTRIAXone (ROCEPHIN) 1 g in sodium chloride 0.9 % 100 mL IVPB  Status:  Discontinued     1 g 200 mL/hr over 30 Minutes Intravenous Every 24 hours 09/07/18 1517 09/10/18 1330   09/08/18 1000  azithromycin (ZITHROMAX) tablet 250 mg  Status:  Discontinued     250 mg Oral Daily 09/07/18 1517 09/10/18 1330   09/07/18 0915  cefTRIAXone (ROCEPHIN) 2 g in sodium chloride 0.9 % 100 mL IVPB  Status:  Discontinued     2 g 200 mL/hr over 30 Minutes Intravenous Every 24 hours 09/07/18 0910 09/07/18 1517   09/07/18 0915  azithromycin (ZITHROMAX) 500 mg in sodium chloride 0.9 % 250 mL IVPB  Status:  Discontinued     500 mg 250 mL/hr over 60 Minutes Intravenous Every 24 hours 09/07/18 0910 09/07/18 1517      Procedures: None  CONSULTS: Neurology, nephrology  Time spent: 25 minutes-Greater than 50% of this time  was spent in counseling, explanation of diagnosis, planning of further management, and coordination of care.  MEDICATIONS: Scheduled Meds: . albuterol  5 mg Nebulization Once  . amLODipine  10 mg Oral QHS    . aspirin EC  81 mg Oral Daily  . carvedilol  12.5 mg Oral BID WC  . cholecalciferol  5,000 Units Oral Daily  . dorzolamide  1 drop Both Eyes BID  . furosemide  160 mg Oral BID  . gabapentin  300 mg Oral Daily  . hydrALAZINE  50 mg Oral BID  . insulin aspart  0-9 Units Subcutaneous TID WC  . insulin detemir  15 Units Subcutaneous QHS  . latanoprost  1 drop Both Eyes QHS  . multivitamin with minerals  1 tablet Oral Daily  . pantoprazole  40 mg Oral Daily  . rosuvastatin  40 mg Oral QHS  . vitamin B-12  1,000 mcg Oral Daily   Continuous Infusions: . sodium chloride     PRN Meds:.acetaminophen, levalbuterol **AND** ipratropium, nitroGLYCERIN   PHYSICAL EXAM: Vital signs: Vitals:   09/11/18 0450 09/11/18 0800 09/11/18 0857 09/11/18 0900  BP:   (!) 148/64   Pulse:   76   Resp:      Temp:      TempSrc:      SpO2:  92%  94%  Weight: 102.1 kg     Height:       Filed Weights   09/07/18 0750 09/11/18 0450  Weight: 99.8 kg 102.1 kg   Body mass index is 34.22 kg/m.   General appearance:Awake, alert, not in any distress.  Eyes:no scleral icterus. HEENT: Atraumatic and Normocephalic Neck: supple, no JVD. Resp:Good air entry bilaterally, few bibasilar rales but mostly clear to auscultation CVS: S1 S2 regular, no murmurs. GI: Bowel sounds present, Non tender and not distended with no gaurding, rigidity or rebound. Extremities: B/L Lower Ext shows no edema, both legs are warm to touch Neurology:  Non focal Psychiatric: Normal judgment and insight. Normal mood. Musculoskeletal:No digital cyanosis Skin:No Rash, warm and dry Wounds:N/A  I have personally reviewed following labs and imaging studies  LABORATORY DATA: CBC: Recent Labs  Lab 09/07/18 0805 09/08/18 0353 09/09/18 0327 09/10/18 0344 09/11/18 0344  WBC 14.5* 18.1* 20.1* 16.1* 13.7*  NEUTROABS  --  16.2*  --   --   --   HGB 9.1* 7.9* 9.7* 9.6* 9.5*  HCT 30.7* 25.9* 31.1* 30.6* 30.7*  MCV 91.1 88.1 89.9 88.4  88.7  PLT 337 313 351 361 676    Basic Metabolic Panel: Recent Labs  Lab 09/07/18 0805 09/08/18 0353 09/09/18 0327 09/10/18 0344 09/11/18 0344  NA 142 141 142 141 140  K 4.1 4.0 4.4 4.0 4.0  CL 110 111 109 106 106  CO2 21* 20* 22 23 23   GLUCOSE 179* 84 50* 120* 128*  BUN 37* 49* 57* 68* 73*  CREATININE 4.16* 4.29* 4.58* 4.97* 5.26*  CALCIUM 8.5* 8.7* 8.8* 8.3* 8.2*  MG  --  2.0 2.0  --   --   PHOS  --   --  4.3 4.1 5.1*    GFR: Estimated Creatinine Clearance: 12.6 mL/min (A) (by C-G formula based on SCr of 5.26 mg/dL (H)).  Liver Function Tests: Recent Labs  Lab 09/09/18 0327 09/10/18 0344 09/11/18 0344  ALBUMIN 3.0* 2.7* 2.5*   No results for input(s): LIPASE, AMYLASE in the last 168 hours. No results for input(s): AMMONIA in the last 168 hours.  Coagulation Profile: No results for  input(s): INR, PROTIME in the last 168 hours.  Cardiac Enzymes: Recent Labs  Lab 09/08/18 1615 09/08/18 2230 09/09/18 0327  TROPONINI 1.19* 1.28* 1.27*    BNP (last 3 results) No results for input(s): PROBNP in the last 8760 hours.  HbA1C: No results for input(s): HGBA1C in the last 72 hours.  CBG: Recent Labs  Lab 09/10/18 0753 09/10/18 1155 09/10/18 1658 09/10/18 2203 09/11/18 0755  GLUCAP 120* 111* 106* 159* 71    Lipid Profile: Recent Labs    09/10/18 0344  CHOL 102  HDL 45  LDLCALC 45  TRIG 60  CHOLHDL 2.3    Thyroid Function Tests: No results for input(s): TSH, T4TOTAL, FREET4, T3FREE, THYROIDAB in the last 72 hours.  Anemia Panel: Recent Labs    09/10/18 1614  TIBC 206*  IRON 17*    Urine analysis:    Component Value Date/Time   COLORURINE YELLOW 07/26/2017 1000   APPEARANCEUR HAZY (A) 07/26/2017 1000   LABSPEC 1.015 07/26/2017 1000   PHURINE 6.0 07/26/2017 1000   GLUCOSEU NEGATIVE 07/26/2017 1000   HGBUR NEGATIVE 07/26/2017 1000   BILIRUBINUR NEGATIVE 07/26/2017 1000   KETONESUR NEGATIVE 07/26/2017 1000   PROTEINUR 100 (A)  07/26/2017 1000   NITRITE NEGATIVE 07/26/2017 1000   LEUKOCYTESUR TRACE (A) 07/26/2017 1000    Sepsis Labs: Lactic Acid, Venous    Component Value Date/Time   LATICACIDVEN 2.56 (HH) 09/07/2018 1130    MICROBIOLOGY: Recent Results (from the past 240 hour(s))  Blood Culture (routine x 2)     Status: None (Preliminary result)   Collection Time: 09/07/18  9:10 AM  Result Value Ref Range Status   Specimen Description BLOOD BLOOD RIGHT HAND  Final   Special Requests   Final    BOTTLES DRAWN AEROBIC AND ANAEROBIC Blood Culture adequate volume   Culture   Final    NO GROWTH 3 DAYS Performed at [facility], [place] [address], [place], [place] [address]    Report Status PENDING  Incomplete  Blood Culture (routine x 2)     Status: None (Preliminary result)   Collection Time: 09/07/18  9:15 AM  Result Value Ref Range Status   Specimen Description BLOOD BLOOD RIGHT HAND  Final   Special Requests   Final    BOTTLES DRAWN AEROBIC AND ANAEROBIC Blood Culture adequate volume   Culture   Final    NO GROWTH 3 DAYS Performed at [facility], [place] [address] [place], [place] [address]    Report Status PENDING  Incomplete  Culture, sputum-assessment     Status: None   Collection Time: 09/07/18  3:18 PM  Result Value Ref Range Status   Specimen Description SPU  Final   Special Requests NONE  Final   Sputum evaluation   Final    THIS SPECIMEN IS ACCEPTABLE FOR SPUTUM CULTURE Performed at [facility], [address] [place], [place] [address]    Report Status 09/10/2018 FINAL  Final  Culture, respiratory     Status: None   Collection Time: 09/07/18  3:18 PM  Result Value Ref Range Status   Specimen Description SPU  Final   Special Requests NONE Reflexed from [id number]  Final   Gram Stain   Final    FEW WBC PRESENT, PREDOMINANTLY PMN RARE SQUAMOUS EPITHELIAL CELLS PRESENT FEW GRAM POSITIVE COCCI FEW GRAM NEGATIVE RODS RARE GRAM POSITIVE RODS    Culture   Final    FEW  Consistent with normal respiratory  flora. Performed at [facility], [place] [address], [place], [place] [address]    Report Status 09/11/2018 FINAL  Final    RADIOLOGY STUDIES/RESULTS: Dg Chest 2 View  Result Date: 09/07/2018 CLINICAL DATA:  Shortness of breath and productive cough. EXAM: CHEST - 2 VIEW COMPARISON:  06/10/2018 FINDINGS: The patient has new bilateral upper lobe infiltrates and right middle or lower lobe infiltrate. Mild chronic cardiomegaly. Pulmonary vascularity is normal. No effusions. No acute bone abnormality. IMPRESSION: Bilateral pneumonia. Electronically Signed   By: Lorriane Shire M.D.   On: 09/07/2018 08:56   Dg Chest [place] 1v Same Day  Result Date: 09/09/2018 CLINICAL DATA:  Shortness of breath x 2-3 days, chest pain EXAM: PORTABLE CHEST 1 VIEW COMPARISON:  09/07/2018 FINDINGS: Improving multifocal interstitial/perihilar opacities, right infrahilar predominant. Appearance/distribution slightly favors improving interstitial edema over pneumonia. No definite pleural effusions. No pneumothorax. Cardiomegaly. IMPRESSION: Improving multifocal interstitial/perihilar opacities, favoring interstitial edema over pneumonia. No definite pleural effusions. Electronically Signed   By: Julian Hy M.D.   On: 09/09/2018 20:32     LOS: 4 days   Oren Binet, MD  [facility]  If 7PM-7AM, please contact night-coverage  Please page via [email or web address]-Password [email or web address]-click on MD name and type text message  09/11/2018, 11:40 AM

## 2018-09-12 DIAGNOSIS — I5023 Acute on chronic systolic (congestive) heart failure: Secondary | ICD-10-CM

## 2018-09-12 LAB — CULTURE, BLOOD (ROUTINE X 2)
CULTURE: NO GROWTH
Culture: NO GROWTH
SPECIAL REQUESTS: ADEQUATE
SPECIAL REQUESTS: ADEQUATE

## 2018-09-12 LAB — RENAL FUNCTION PANEL
Albumin: 2.6 g/dL — ABNORMAL LOW (ref 3.5–5.0)
Anion gap: 13 (ref 5–15)
BUN: 77 mg/dL — AB (ref 8–23)
CO2: 22 mmol/L (ref 22–32)
CREATININE: 5.45 mg/dL — AB (ref 0.44–1.00)
Calcium: 8.7 mg/dL — ABNORMAL LOW (ref 8.9–10.3)
Chloride: 107 mmol/L (ref 98–111)
GFR calc Af Amer: 9 mL/min — ABNORMAL LOW (ref >60)
GFR, EST NON AFRICAN AMERICAN: 7 mL/min — AB (ref >60)
Glucose, Bld: 142 mg/dL — ABNORMAL HIGH (ref 70–99)
Phosphorus: 5.3 mg/dL — ABNORMAL HIGH (ref 2.5–4.6)
Potassium: 4.1 mmol/L (ref 3.5–5.1)
Sodium: 142 mmol/L (ref 135–145)

## 2018-09-12 LAB — GLUCOSE, CAPILLARY: Glucose-Capillary: 95 mg/dL (ref 70–99)

## 2018-09-12 MED ORDER — GABAPENTIN 300 MG PO CAPS: 300.0000 mg | ORAL_CAPSULE | Freq: Every day | ORAL | 0 refills | Status: DC

## 2018-09-12 MED ORDER — HYDRALAZINE HCL 50 MG PO TABS: 50.0000 mg | ORAL_TABLET | Freq: Two times a day (BID) | ORAL | 0 refills | Status: DC

## 2018-09-12 MED ORDER — FUROSEMIDE 80 MG PO TABS: 160.0000 mg | ORAL_TABLET | Freq: Two times a day (BID) | ORAL | 0 refills | Status: DC

## 2018-09-12 MED ORDER — CARVEDILOL 12.5 MG PO TABS: 12.5000 mg | ORAL_TABLET | Freq: Two times a day (BID) | ORAL | 0 refills | Status: DC

## 2018-09-12 MED ORDER — INSULIN DETEMIR 100 UNIT/ML ~~LOC~~ SOLN: 15.0000 [IU] | Freq: Every day | SUBCUTANEOUS | 0 refills | Status: AC

## 2018-09-12 MED FILL — FUROSEMIDE 80 MG TAB: 80 | 30 days supply | Qty: 120 | Fill #0

## 2018-09-12 MED FILL — hydrALAZINE HCL 50 MG TABS: 50 | 30 days supply | Qty: 60 | Fill #0

## 2018-09-12 MED FILL — GABAPENTIN 300 MG CAPSULE: 300 | 30 days supply | Qty: 30 | Fill #0

## 2018-09-12 MED FILL — CARVEDILOL 12.5 MG TABLET: 12.5 | 30 days supply | Qty: 60 | Fill #0

## 2018-09-12 NOTE — Progress Notes (Signed)
Physical Therapy Treatment Patient Details Name: Priscilla Davis MRN: [id number] DOB: 12-30-1950 Today's Date: 09/12/2018    History of Present Illness 68yo female presenting with 2 days of cough with green sputum. Admitted for B lob bacterial pneumonia. 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  Pt tolerated session well.  Reviewed and completed 10 reps of incentive spirometer with cues for technique and frequency.  Plan for HHPT remains appropriate.      Follow Up Recommendations  Home health PT     Equipment Recommendations  3in1 (PT)    Recommendations for Other Services       Precautions / Restrictions Precautions Precautions: Other (comment) Precaution Comments: watch SPO2  Restrictions Weight Bearing Restrictions: No    Mobility  Bed Mobility Overal bed mobility: Modified Independent                Transfers Overall transfer level: Modified independent Equipment used: Straight cane                Ambulation/Gait Ambulation/Gait assistance: Modified independent (Device/Increase time) Gait Distance (Feet): 150 Feet Assistive device: Straight cane Gait Pattern/deviations: WFL(Within Functional Limits)     General Gait Details: slow and guarded with good use of SPC.     Stairs Stairs: Yes Stairs assistance: Supervision Stair Management: One rail Right;With cane Number of Stairs: 10 General stair comments: Cues for sequnecing with cane and use of R rail to ascend.     Wheelchair Mobility    Modified Rankin (Stroke Patients Only)       Balance     Sitting balance-Leahy Scale: Normal       Standing balance-Leahy Scale: Good                              Cognition Arousal/Alertness: Awake/alert Behavior During Therapy: WFL for tasks assessed/performed Overall Cognitive Status: Within  Functional Limits for tasks assessed                                        Exercises Other Exercises Other Exercises: 1x10 Incentive Spirometer 1526ml quality.  Educated on improving frequnecy at d/c.      General Comments        Pertinent Vitals/Pain Pain Assessment: No/denies pain    Home Living                      Prior Function            PT Goals (current goals can now be found in the care plan section) Acute Rehab PT Goals Patient Stated Goal: feel better, go home  Potential to Achieve Goals: Good Progress towards PT goals: Progressing toward goals    Frequency    Min 3X/week      PT Plan Current plan remains appropriate    Co-evaluation              AM-PAC PT "6 Clicks" Mobility   Outcome Measure  Help needed turning from your back to your side while in a flat bed without using bedrails?: None Help needed moving from lying on your back to sitting on the side of a flat bed without using bedrails?:  None Help needed moving to and from a bed to a chair (including a wheelchair)?: None Help needed standing up from a chair using your arms (e.g., wheelchair or bedside chair)?: None Help needed to walk in hospital room?: None Help needed climbing 3-5 steps with a railing? : A Little 6 Click Score: 23    End of Session Equipment Utilized During Treatment: Gait belt Activity Tolerance: Patient tolerated treatment well Patient left: in bed;with call bell/phone within reach Nurse Communication: Mobility status;Other (comment) PT Visit Diagnosis: Unsteadiness on feet (R26.81);Muscle weakness (generalized) (M62.81);Difficulty in walking, not elsewhere classified (R26.2)     Time: 8377-9396 PT Time Calculation (min) (ACUTE ONLY): 20 min  Charges:  $Gait Training: 8-22 mins                     Governor Rooks, PTA Acute Rehabilitation Services Pager [phone] Office [phone]     Priscilla Davis 09/12/2018, 3:36  PM

## 2018-09-12 NOTE — Progress Notes (Signed)
Notified by CCMD of 1.58 sec sinus pause. Pt found resting. Assessed pt. Pt denies any chest pain. Will continue to monitor.

## 2018-09-12 NOTE — Progress Notes (Addendum)
Progress Note  Patient Name: Priscilla Davis Date of Encounter: 09/12/2018  Primary Cardiologist: Skeet Latch, MD  Subjective   Pt doing well today. No symptoms. Anticipating d/c today. She is scheduled for follow up and stress test   Inpatient Medications    Scheduled Meds: . albuterol  5 mg Nebulization Once  . amLODipine  10 mg Oral QHS  . aspirin EC  81 mg Oral Daily  . carvedilol  12.5 mg Oral BID WC  . cholecalciferol  5,000 Units Oral Daily  . dorzolamide  1 drop Both Eyes BID  . furosemide  160 mg Oral BID  . gabapentin  300 mg Oral Daily  . heparin injection (subcutaneous)  5,000 Units Subcutaneous Q8H  . hydrALAZINE  50 mg Oral BID  . insulin aspart  0-9 Units Subcutaneous TID WC  . insulin detemir  15 Units Subcutaneous QHS  . latanoprost  1 drop Both Eyes QHS  . multivitamin with minerals  1 tablet Oral Daily  . pantoprazole  40 mg Oral Daily  . rosuvastatin  40 mg Oral QHS  . vitamin B-12  1,000 mcg Oral Daily   Continuous Infusions: . sodium chloride     PRN Meds: acetaminophen, levalbuterol **AND** ipratropium, nitroGLYCERIN   Vital Signs    Vitals:   09/11/18 1504 09/11/18 1731 09/11/18 2047 09/12/18 0534  BP: (!) 156/67 (!) 157/75 (!) 150/63 (!) 136/51  Pulse: 73  68 69  Resp: 20  20 20   Temp: 98.1 F (36.7 C)  98.1 F (36.7 C) (!) 97.3 F (36.3 C)  TempSrc: Oral  Oral Oral  SpO2: 100%  (!) 66% 96%  Weight:    103.4 kg  Height:        Intake/Output Summary (Last 24 hours) at 09/12/2018 0827 Last data filed at 09/12/2018 7353 Gross per 24 hour  Intake 117 ml  Output 800 ml  Net -683 ml   Filed Weights   09/07/18 0750 09/11/18 0450 09/12/18 0534  Weight: 99.8 kg 102.1 kg 103.4 kg    Physical Exam   General: Well developed, well nourished, NAD Skin: Warm, dry, intact  Head: Normocephalic, atraumatic, clear, moist mucus membranes. Neck: Negative for carotid bruits. No JVD Lungs:Clear to ausculation bilaterally. No wheezes,  rales, or rhonchi. Breathing is unlabored. Cardiovascular: RRR with S1 S2. No murmurs, rubs, gallops, or LV heave appreciated. Abdomen: Soft, non-tender, non-distended with normoactive bowel sounds. No hepatomegaly, No rebound/guarding. No obvious abdominal masses. MSK: Strength and tone appear normal for age. 5/5 in all extremities Extremities: No edema. No clubbing or cyanosis. DP/PT pulses 2+ bilaterally Neuro: Alert and oriented. No focal deficits. No facial asymmetry. MAE spontaneously. Psych: Responds to questions appropriately with normal affect.    Labs    Chemistry Recent Labs  Lab 09/10/18 0344 09/11/18 0344 09/12/18 0332  NA 141 140 142  K 4.0 4.0 4.1  CL 106 106 107  CO2 23 23 22   GLUCOSE 120* 128* 142*  BUN 68* 73* 77*  CREATININE 4.97* 5.26* 5.45*  CALCIUM 8.3* 8.2* 8.7*  ALBUMIN 2.7* 2.5* 2.6*  GFRNONAA 8* 8* 7*  GFRAA 10* 9* 9*  ANIONGAP 12 11 13      Hematology Recent Labs  Lab 09/09/18 0327 09/10/18 0344 09/11/18 0344  WBC 20.1* 16.1* 13.7*  RBC 3.46* 3.46* 3.46*  HGB 9.7* 9.6* 9.5*  HCT 31.1* 30.6* 30.7*  MCV 89.9 88.4 88.7  MCH 28.0 27.7 27.5  MCHC 31.2 31.4 30.9  RDW 14.0 14.1  14.0  PLT 351 361 320    Cardiac Enzymes Recent Labs  Lab 09/08/18 1615 09/08/18 2230 09/09/18 0327  TROPONINI 1.19* 1.28* 1.27*    Recent Labs  Lab 09/07/18 0810  TROPIPOC 0.01     BNPNo results for input(s): BNP, PROBNP in the last 168 hours.   DDimer No results for input(s): DDIMER in the last 168 hours.   Radiology    No results found.  Telemetry    09/12/2018 NSR - Personally Reviewed  ECG    No new tracing as of 09/12/2018- Personally Reviewed  Cardiac Studies   Echocardiogram: 09/10/18  Study Conclusions  - Left ventricle: The cavity size was normal. Wall thickness was increased in a pattern of mild LVH. Systolic function was moderately reduced. The estimated ejection fraction was in the range of 35% to 40%. Diffuse  hypokinesis. Doppler parameters are consistent with abnormal left ventricular relaxation (grade 1 diastolic dysfunction). The E/e&' ratio is between 8-15, suggesting indeterminate LV filling pressure. - Aortic valve: Sclerosis without stenosis. There was no regurgitation. - Mitral valve: Mildly thickened leaflets . There was trivial regurgitation. - Left atrium: Moderately dilated. - Inferior vena cava: The vessel was normal in size. The respirophasic diameter changes were in the normal range (>= 50%), consistent with normal central venous pressure.  Impressions:  - LVEF 35-40%, mild LVH, global hypokinesis, grade 1 DD, indeterminate LV filling pressure, trivial MR, moderate LAE, normal IVC.  Patient Profile     68 y.o. female with a hx of DM, CKD IV, and asthma here with hypertensive urgency and NSTEMI.  Assessment & Plan    1. Elevated troponin with new systolic dysfunction: -Presented with several days of SOB and BLE>>>BP noted to be poorly controlled  -Trop elevated at 1.27>>with flat trendand no ACS symptoms>> thought to be in the setting of poorly controlled BP and fluid volume overload -136/51>150/63>156/67 -No plans to pursue cath unless undergoes HD given A>CKD stage V -Echocardiogram 01/13 with LVEF of 35% to 40% with diffuse hypokinesis and G1DD -No room for HF medications in the setting of renal dysfunction>>>could consider Bidil as only option  -ContinueASA, BB, statin   2. Hypertensive heart disease: -BP noted to be poorly controlled however improving with diuresis>>see above  -Continue amlodipine and carvedilol  -Hydralazine 50mg  twice daily added 09/09/17 -Losartan on hold given worsening creatinine   3. 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 DM2: -On SSI for glucose control while inpatient status -Per IM   Signed, Kathyrn Drown NP-C [facility] Pager: [phone] 09/12/2018, 8:27 AM    Agree with note by Kathyrn Drown NP  Okay for discharge.  Appears euvolemic.  Follow-up with Kerin Ransom, PA-C in 7 days and with Dr. Oval Linsey to follow.  We will get a outpatient Myoview to further evaluate the etiology of her LV dysfunction.   Lorretta Harp, M.D., [place], [facility], [place]ver[place] [address] [place], [place]  [id number]  [phone] 09/12/2018 10:53 AM   For questions or updates, please contact   Please consult [email or web address] for contact info under Cardiology/STEMI.

## 2018-09-12 NOTE — Progress Notes (Signed)
Subjective: Interval History: has no complaint, breathing ok, wants to go home.  Objective: Vital signs in last 24 hours: Temp:  [97.3 F (36.3 C)-98.1 F (36.7 C)] 97.3 F (36.3 C) (01/15 0534) Pulse Rate:  [68-73] 69 (01/15 0534) Resp:  [20] 20 (01/15 0534) BP: (136-157)/(51-75) 136/51 (01/15 0534) SpO2:  [66 %-100 %] 96 % (01/15 0534) Weight:  [103.4 kg] 103.4 kg (01/15 0534) Weight change: 1.3 kg  Intake/Output from previous day: 01/14 0701 - 01/15 0700 In: 117 [IV Piggyback:117] Out: 1500 [Urine:1500] Intake/Output this shift: No intake/output data recorded.  General appearance: alert, cooperative, no distress and moderately obese Resp: rales bibasilar Cardio: S1, S2 normal and systolic murmur: systolic ejection 2/6, crescendo and decrescendo at 2nd left intercostal space GI: soft, non-tender; bowel sounds normal; no masses,  no organomegaly Extremities: edema 1+ and AVG LFA  Lab Results: Recent Labs    09/10/18 0344 09/11/18 0344  WBC 16.1* 13.7*  HGB 9.6* 9.5*  HCT 30.6* 30.7*  PLT 361 320   BMET:  Recent Labs    09/11/18 0344 09/12/18 0332  NA 140 142  K 4.0 4.1  CL 106 107  CO2 23 22  GLUCOSE 128* 142*  BUN 73* 77*  CREATININE 5.26* 5.45*  CALCIUM 8.2* 8.7*   Recent Labs    09/10/18 1614  PTH 52   Iron Studies:  Recent Labs    09/10/18 1614  IRON 17*  TIBC 206*    Studies/Results: No results found.  I have reviewed the patient's current medications.  Assessment/Plan: 1 CKD 5 stable ok to d/c.  Vol improved needs close f/u 2 Nstemi 3 Anemia got fe 4 DM controlled 5 HTN lower with lower vol P f/u in 2 wk, ok to d/c    LOS: 5 days   Jeneen Rinks Freddie Nghiem 09/12/2018,10:42 AM

## 2018-09-12 NOTE — Discharge Summary (Signed)
PATIENT DETAILS Name: Priscilla Davis Age: 68 y.o. Sex: female Date of Birth: 12/07/1950 MRN: [id number]. Admitting Physician: Barb Merino, MD [id number], Beryle Lathe, FNP  Admit Date: 09/07/2018 Discharge date: 09/12/2018  Recommendations for Outpatient Follow-up:  1. Follow up with PCP in 1-2 weeks 2. Please obtain BMP/CBC in one week 3. Please ensure follow-up with cardiology and nephrology  Admitted From:  Home   Disposition: [place]: No  Equipment/Devices: None  Discharge Condition: Stable  CODE STATUS: FULL CODE  Diet recommendation:  Heart Healthy / Carb Modified  Brief Summary: See H&P, Labs, Consult and Test reports for all details in brief, Patient is a 68 y.o. female with history of insulin-dependent DM-2, stage IV CKD, asthma-presented with 2-day history of cough, and worsening exertional dyspnea.  Initially thought to have pneumonia-but upon further evaluation-now thought to have a new diagnosis of decompensated systolic heart failure.  Hospital course complicated by development of non-STEMI.  See below for further   Brief Hospital Course: Acute hypoxic respiratory failure secondary to newly diagnosed decompensated systolic heart failure (EF 35-40% by TTE on 09/10/2018) in the setting of worsening CKD stage IV: Markedly improved by the day of diagnosis discharge.  Initially thought to have community-acquired pneumonia-but upon further evaluation and marked improvement with diuretics-she is now thought to have acute hypoxic respiratory failure newly diagnosed acute on chronic systolic heart failure in the setting of aggressively worsening CKD stage IV.  Due to diagnostic uncertainty initially-she was placed on antimicrobial therapy-in fact has completed 5-day course-and does not require any further antimicrobial therapy on discharge.  Since she markedly improved with diuretics-she was evaluated by nephrology-with recommendations to continue high-dose diuretic  therapy on discharge.  She was also seen by cardiology-they will arrange for outpatient follow-up.  Note-she is doing very well on room air-and her lungs are clear to auscultation  AKI on CKD stage IV: Likely progressive renal failure-nontraumatic response to diuretics-appreciate nephrology evaluation-we will discharge on oral Lasix-and will need very close outpatient follow-up with nephrology-suspect will require initiation of dialysis in the very near future.    Non-STEMI: No further chest pain-continue aspirin, statin, beta-blocker-no longer on IV heparin.  Cardiology recommending outpatient nuclear stress test  Insulin-dependent DM-2 hypoglycemia: No further hypoglycemic episodes overnight-continue Levemir 15 units on discharge.  Suspect requires significant less dosage of insulin due to markedly worse renal function.  Anemia: Secondary to CKD-worsened by acute illness-transfused 1 unit of PRBC on 1/11-as hemoglobin was less than 8 and patient was having non-STEMI.  Hemoglobin currently stable  Hypertension: Moderately controlled-continue with amlodipine, metoprolol, hydralazine and Lasix.  Further optimization deferred to the outpatient setting  Dyslipidemia: Continue statin  Asthma: Stable-continue bronchodilators.  Tobacco abuse: Counseled  Procedures/Studies: None  Discharge Diagnoses:  Principal Problem:   Bilateral pneumonia Active Problems:   Chronic kidney disease (CKD), stage IV (severe) (HCC)   Mild intermittent asthma   Type 2 diabetes mellitus with stage 4 chronic kidney disease (HCC)   Smoker   Pneumonia   Demand ischemia ([place])   Essential hypertension   Discharge Instructions:  Activity:  As tolerated   Discharge Instructions    Call MD for:  difficulty breathing, headache or visual disturbances   Complete by:  As directed    Call MD for:  persistant nausea and vomiting   Complete by:  As directed    Diet - low sodium heart healthy   Complete by:   As directed    Diet Carb Modified  Complete by:  As directed    Discharge instructions   Complete by:  As directed    Follow with Primary care practitioner brake, Beryle Lathe, FNP in 1 week  Although at [facility]-you already have an appointment on 1/21-please keep that appointment  Please get a complete blood count and chemistry panel checked by your Primary MD at your next visit, and again as instructed by your Primary MD.  Get Medicines reviewed and adjusted: Please take all your medications with you for your next visit with your Primary MD  Laboratory/radiological data: Please request your Primary MD to go over all hospital tests and procedure/radiological results at the follow up, please ask your Primary MD to get all Hospital records sent to his/her office.  In some cases, they will be blood work, cultures and biopsy results pending at the time of your discharge. Please request that your primary care M.D. follows up on these results.  Also Note the following: If you experience worsening of your admission symptoms, develop shortness of breath, life threatening emergency, suicidal or homicidal thoughts you must seek medical attention immediately by calling 911 or calling your MD immediately  if symptoms less severe.  You must read complete instructions/literature along with all the possible adverse reactions/side effects for all the Medicines you take and that have been prescribed to you. Take any new Medicines after you have completely understood and accpet all the possible adverse reactions/side effects.   Do not drive when taking Pain medications or sleeping medications (Benzodaizepines)  Do not take more than prescribed Pain, Sleep and Anxiety Medications. It is not advisable to combine anxiety,sleep and pain medications without talking with your primary care practitioner  Special Instructions: If you have smoked or chewed Tobacco  in the last 2 yrs please stop smoking, stop  any regular Alcohol  and or any Recreational drug use.  Wear Seat belts while driving.  Please note: You were cared for by a hospitalist during your hospital stay. Once you are discharged, your primary care physician will handle any further medical issues. Please note that NO REFILLS for any discharge medications will be authorized once you are discharged, as it is imperative that you return to your primary care physician (or establish a relationship with a primary care physician if you do not have one) for your post hospital discharge needs so that they can reassess your need for medications and monitor your lab values.   Increase activity slowly   Complete by:  As directed      Allergies as of 09/12/2018   No Known Allergies     Medication List    STOP taking these medications   losartan 25 MG tablet Commonly known as:  COZAAR   metoprolol tartrate 25 MG tablet Commonly known as:  LOPRESSOR     TAKE these medications   Acetaminophen 500 MG coapsule Take 1,000 mg by mouth every 6 (six) hours as needed for mild pain or moderate pain.   albuterol 108 (90 Base) MCG/ACT inhaler Commonly known as:  PROVENTIL HFA;VENTOLIN HFA Inhale 2 puffs into the lungs every 6 (six) hours as needed for wheezing or shortness of breath.   amLODipine 10 MG tablet Commonly known as:  NORVASC Take 10 mg by mouth daily.   aspirin EC 81 MG tablet Take 81 mg by mouth daily.   carvedilol 12.5 MG tablet Commonly known as:  COREG Take 1 tablet (12.5 mg total) by mouth 2 (two) times daily with a meal.  dorzolamide 2 % ophthalmic solution Commonly known as:  TRUSOPT Place 1 drop into both eyes 2 (two) times daily.   FLUoxetine 20 MG capsule Commonly known as:  PROZAC Take 20 mg by mouth daily.   furosemide 80 MG tablet Commonly known as:  LASIX Take 2 tablets (160 mg total) by mouth 2 (two) times daily.   gabapentin 300 MG capsule Commonly known as:  NEURONTIN Take 1 capsule (300 mg total)  by mouth daily. What changed:    medication strength  how much to take  when to take this   hydrALAZINE 50 MG tablet Commonly known as:  APRESOLINE Take 1 tablet (50 mg total) by mouth 2 (two) times daily.   insulin detemir 100 UNIT/ML injection Commonly known as:  LEVEMIR Inject 0.15 mLs (15 Units total) into the skin daily. What changed:    how much to take  when to take this   latanoprost 0.005 % ophthalmic solution Commonly known as:  XALATAN Place 1 drop into both eyes at bedtime.   multivitamin with minerals Tabs tablet Take 1 tablet by mouth daily. woman's   omeprazole 20 MG capsule Commonly known as:  PRILOSEC Take 40 mg by mouth daily.   ranitidine 150 MG tablet Commonly known as:  ZANTAC Take 150 mg by mouth daily.   Rosuvastatin Calcium 40 MG Cpsp Take 40 mg by mouth at bedtime.   VICTOZA 18 MG/3ML Sopn Generic drug:  liraglutide Inject 1.8 mg into the skin every evening.   vitamin B-12 1000 MCG tablet Commonly known as:  CYANOCOBALAMIN Take 1,000 mcg by mouth daily.   Vitamin D 125 MCG (5000 UT) Caps Take 5,000 Units by mouth daily.      Follow-up Information    Erlene Quan, PA-C Follow up on 09/24/2018.   Specialties:  Cardiology, Radiology Why:  Your follow up appointment will be on 09/24/2018 at 0830.  Contact information: [address] [place] [address] [phone]        [facility] Follow up on 09/27/2018.   Specialty:  Cardiology Why:  Your stress test is scheduled for 09/27/2018 at 1pm.  Contact information: [address] [place], [place], [place]. Schedule an appointment as soon as possible for a visit in 1 week(s).   Specialty:  Family Medicine Contact information: [place] [address] [phone]        Skeet Latch, MD .   Specialty:  Cardiology Contact information: [address] [place]  [place] [address] [phone]        Justin Mend, MD Follow up on 09/18/2018.   Specialty:  Internal Medicine Why:  keep existing appointment Contact information: [place] [address] [phone]          No Known Allergies  Consultations:   cardiology and nephrology   Other Procedures/Studies: Dg Chest 2 View  Result Date: 09/07/2018 CLINICAL DATA:  Shortness of breath and productive cough. EXAM: CHEST - 2 VIEW COMPARISON:  06/10/2018 FINDINGS: The patient has new bilateral upper lobe infiltrates and right middle or lower lobe infiltrate. Mild chronic cardiomegaly. Pulmonary vascularity is normal. No effusions. No acute bone abnormality. IMPRESSION: Bilateral pneumonia. Electronically Signed   By: Lorriane Shire M.D.   On: 09/07/2018 08:56   Dg Chest Port 1v Same Day  Result Date: 09/09/2018 CLINICAL DATA:  Shortness of breath x 2-3 days, chest pain  EXAM: PORTABLE CHEST 1 VIEW COMPARISON:  09/07/2018 FINDINGS: Improving multifocal interstitial/perihilar opacities, right infrahilar predominant. Appearance/distribution slightly favors improving interstitial edema over pneumonia. No definite pleural effusions. No pneumothorax. Cardiomegaly. IMPRESSION: Improving multifocal interstitial/perihilar opacities, favoring interstitial edema over pneumonia. No definite pleural effusions. Electronically Signed   By: Julian Hy M.D.   On: 09/09/2018 20:32      TODAY-DAY OF DISCHARGE:  Subjective:   Dashonna Chagnon today has no headache,no chest abdominal pain,no new weakness tingling or numbness, feels much better wants to go home today.   Objective:   Blood pressure (!) 136/51, pulse 69, temperature (!) 97.3 F (36.3 C), temperature source Oral, resp. rate 20, height 5\' 8"  (1.727 m), weight 103.4 kg, SpO2 96 %.  Intake/Output Summary (Last 24 hours) at 09/12/2018 0842 Last data filed at 09/12/2018 5809 Gross per 24 hour  Intake 117 ml  Output 800 ml    Net -683 ml   Filed Weights   09/07/18 0750 09/11/18 0450 09/12/18 0534  Weight: 99.8 kg 102.1 kg 103.4 kg    Exam: Awake Alert, Oriented *3, No new F.N deficits, Normal affect [place].AT,PERRAL Supple Neck,No JVD, No cervical lymphadenopathy appriciated.  Symmetrical Chest wall movement, Good air movement bilaterally, CTAB RRR,No Gallops,Rubs or new Murmurs, No Parasternal Heave +ve B.Sounds, Abd Soft, Non tender, No organomegaly appriciated, No rebound -guarding or rigidity. No Cyanosis, Clubbing or edema, No new Rash or bruise   PERTINENT RADIOLOGIC STUDIES: Dg Chest 2 View  Result Date: 09/07/2018 CLINICAL DATA:  Shortness of breath and productive cough. EXAM: CHEST - 2 VIEW COMPARISON:  06/10/2018 FINDINGS: The patient has new bilateral upper lobe infiltrates and right middle or lower lobe infiltrate. Mild chronic cardiomegaly. Pulmonary vascularity is normal. No effusions. No acute bone abnormality. IMPRESSION: Bilateral pneumonia. Electronically Signed   By: Lorriane Shire M.D.   On: 09/07/2018 08:56   Dg Chest Port 1v Same Day  Result Date: 09/09/2018 CLINICAL DATA:  Shortness of breath x 2-3 days, chest pain EXAM: PORTABLE CHEST 1 VIEW COMPARISON:  09/07/2018 FINDINGS: Improving multifocal interstitial/perihilar opacities, right infrahilar predominant. Appearance/distribution slightly favors improving interstitial edema over pneumonia. No definite pleural effusions. No pneumothorax. Cardiomegaly. IMPRESSION: Improving multifocal interstitial/perihilar opacities, favoring interstitial edema over pneumonia. No definite pleural effusions. Electronically Signed   By: Julian Hy M.D.   On: 09/09/2018 20:32     PERTINENT LAB RESULTS: CBC: Recent Labs    09/10/18 0344 09/11/18 0344  WBC 16.1* 13.7*  HGB 9.6* 9.5*  HCT 30.6* 30.7*  PLT 361 320   CMET CMP     Component Value Date/Time   NA 142 09/12/2018 0332   K 4.1 09/12/2018 0332   CL 107 09/12/2018 0332   CO2 22  09/12/2018 0332   GLUCOSE 142 (H) 09/12/2018 0332   BUN 77 (H) 09/12/2018 0332   CREATININE 5.45 (H) 09/12/2018 0332   CALCIUM 8.7 (L) 09/12/2018 0332   PROT 6.9 06/10/2018 1219   ALBUMIN 2.6 (L) 09/12/2018 0332   AST 17 06/10/2018 1219   ALT 12 06/10/2018 1219   ALKPHOS 74 06/10/2018 1219   BILITOT 0.6 06/10/2018 1219   GFRNONAA 7 (L) 09/12/2018 0332   GFRAA 9 (L) 09/12/2018 0332    GFR Estimated Creatinine Clearance: 12.3 mL/min (A) (by C-G formula based on SCr of 5.45 mg/dL (H)). No results for input(s): LIPASE, AMYLASE in the last 72 hours. No results for input(s): CKTOTAL, CKMB, CKMBINDEX, TROPONINI in the last 72 hours. Invalid input(s): POCBNP No results for  input(s): DDIMER in the last 72 hours. No results for input(s): HGBA1C in the last 72 hours. Recent Labs    09/10/18 0344  CHOL 102  HDL 45  LDLCALC 45  TRIG 60  CHOLHDL 2.3   No results for input(s): TSH, T4TOTAL, T3FREE, THYROIDAB in the last 72 hours.  Invalid input(s): FREET3 Recent Labs    09/10/18 1614  TIBC 206*  IRON 17*   Coags: No results for input(s): INR in the last 72 hours.  Invalid input(s): PT Microbiology: Recent Results (from the past 240 hour(s))  Blood Culture (routine x 2)     Status: None (Preliminary result)   Collection Time: 09/07/18  9:10 AM  Result Value Ref Range Status   Specimen Description BLOOD BLOOD RIGHT HAND  Final   Special Requests   Final    BOTTLES DRAWN AEROBIC AND ANAEROBIC Blood Culture adequate volume   Culture   Final    NO GROWTH 4 DAYS Performed at [facility], [address], [place], [place] [address]    Report Status PENDING  Incomplete  Blood Culture (routine x 2)     Status: None (Preliminary result)   Collection Time: 09/07/18  9:15 AM  Result Value Ref Range Status   Specimen Description BLOOD BLOOD RIGHT HAND  Final   Special Requests   Final    BOTTLES DRAWN AEROBIC AND ANAEROBIC Blood Culture adequate volume   Culture   Final     NO GROWTH 4 DAYS Performed at [facility], [place] [address] [place], [place] [address]    Report Status PENDING  Incomplete  Culture, sputum-assessment     Status: None   Collection Time: 09/07/18  3:18 PM  Result Value Ref Range Status   Specimen Description SPU  Final   Special Requests NONE  Final   Sputum evaluation   Final    THIS SPECIMEN IS ACCEPTABLE FOR SPUTUM CULTURE Performed at [facility], [address], [place], [place] [address]    Report Status 09/10/2018 FINAL  Final  Culture, respiratory     Status: None   Collection Time: 09/07/18  3:18 PM  Result Value Ref Range Status   Specimen Description SPU  Final   Special Requests NONE Reflexed from [id number]  Final   Gram Stain   Final    FEW WBC PRESENT, PREDOMINANTLY PMN RARE SQUAMOUS EPITHELIAL CELLS PRESENT FEW GRAM POSITIVE COCCI FEW GRAM NEGATIVE RODS RARE GRAM POSITIVE RODS    Culture   Final    FEW Consistent with normal respiratory flora. Performed at [facility], [place] [address], [place], [place] [address]    Report Status 09/11/2018 FINAL  Final    FURTHER DISCHARGE INSTRUCTIONS:  Get Medicines reviewed and adjusted: Please take all your medications with you for your next visit with your Primary MD  Laboratory/radiological data: Please request your Primary MD to go over all hospital tests and procedure/radiological results at the follow up, please ask your Primary MD to get all Hospital records sent to his/her office.  In some cases, they will be blood work, cultures and biopsy results pending at the time of your discharge. Please request that your primary care M.D. goes through all the records of your hospital data and follows up on these results.  Also Note the following: If you experience worsening of your admission symptoms, develop shortness of breath, life threatening emergency, suicidal or homicidal thoughts you must seek medical attention immediately by  calling 911 or calling  your MD immediately  if symptoms less severe.  You must read complete instructions/literature along with all the possible adverse reactions/side effects for all the Medicines you take and that have been prescribed to you. Take any new Medicines after you have completely understood and accpet all the possible adverse reactions/side effects.   Do not drive when taking Pain medications or sleeping medications (Benzodaizepines)  Do not take more than prescribed Pain, Sleep and Anxiety Medications. It is not advisable to combine anxiety,sleep and pain medications without talking with your primary care practitioner  Special Instructions: If you have smoked or chewed Tobacco  in the last 2 yrs please stop smoking, stop any regular Alcohol  and or any Recreational drug use.  Wear Seat belts while driving.  Please note: You were cared for by a hospitalist during your hospital stay. Once you are discharged, your primary care physician will handle any further medical issues. Please note that NO REFILLS for any discharge medications will be authorized once you are discharged, as it is imperative that you return to your primary care physician (or establish a relationship with a primary care physician if you do not have one) for your post hospital discharge needs so that they can reassess your need for medications and monitor your lab values.  Total Time spent coordinating discharge including counseling, education and face to face time equals 35 minutes.  SignedOren Binet 09/12/2018 8:42 AM

## 2018-09-12 NOTE — Care Management Note (Signed)
Case Management Note  Patient Details  Name: Priscilla Davis MRN: [id number] Date of Birth: 07-05-1951  Subjective/Objective:   PNA.       Nicholes Mango (Sister)     [phone]       Action/Plan: Transition to home. Resides with brother and sister in law. Pt states has transportation to home.  Expected Discharge Date:  09/12/18               Expected Discharge Plan:  Home/Self Care  In-House Referral:  NA  Discharge planning Services  CM Consult  Post Acute Care Choice:  NA Choice offered to:  Patient  DME Arranged:  N/A(declined need for 3in1/BSC) DME Agency:  NA  HH Arranged:  ([place] Agency:  NA  Status of Service:  Completed, signed off  If discussed at [place] of Stay Meetings, dates discussed:    Additional Comments:  Sharin Mons, RN 09/12/2018, 10:19 AM

## 2018-09-24 ENCOUNTER — Encounter: Payer: Self-pay | Admitting: Cardiology

## 2018-09-24 ENCOUNTER — Ambulatory Visit (INDEPENDENT_AMBULATORY_CARE_PROVIDER_SITE_OTHER): Payer: Medicare Other | Admitting: Cardiology

## 2018-09-24 VITALS — BP 117/58 | HR 76 | Ht 68.0 in | Wt 218.0 lb

## 2018-09-24 DIAGNOSIS — I10 Essential (primary) hypertension: Secondary | ICD-10-CM | POA: Diagnosis not present

## 2018-09-24 DIAGNOSIS — N184 Chronic kidney disease, stage 4 (severe): Secondary | ICD-10-CM | POA: Diagnosis not present

## 2018-09-24 DIAGNOSIS — I214 Non-ST elevation (NSTEMI) myocardial infarction: Secondary | ICD-10-CM

## 2018-09-24 DIAGNOSIS — I429 Cardiomyopathy, unspecified: Secondary | ICD-10-CM | POA: Insufficient documentation

## 2018-09-24 DIAGNOSIS — F17200 Nicotine dependence, unspecified, uncomplicated: Secondary | ICD-10-CM

## 2018-09-24 DIAGNOSIS — Z794 Long term (current) use of insulin: Secondary | ICD-10-CM

## 2018-09-24 DIAGNOSIS — IMO0001 Reserved for inherently not codable concepts without codable children: Secondary | ICD-10-CM

## 2018-09-24 DIAGNOSIS — I248 Other forms of acute ischemic heart disease: Secondary | ICD-10-CM

## 2018-09-24 DIAGNOSIS — E118 Type 2 diabetes mellitus with unspecified complications: Secondary | ICD-10-CM

## 2018-09-24 DIAGNOSIS — I5041 Acute combined systolic (congestive) and diastolic (congestive) heart failure: Secondary | ICD-10-CM | POA: Insufficient documentation

## 2018-09-24 MED ORDER — HYDRALAZINE HCL 25 MG PO TABS: 25.0000 mg | ORAL_TABLET | Freq: Two times a day (BID) | ORAL | 6 refills | Status: DC

## 2018-09-24 NOTE — Assessment & Plan Note (Signed)
EF 35-40%- etiology not yet determined

## 2018-09-24 NOTE — Progress Notes (Signed)
09/24/2018 Priscilla Davis   Dec 27, 1950  [id number]  Primary Physician Priscilla Loader, FNP Primary Cardiologist: Dr Priscilla Davis  HPI: Priscilla Davis is a pleasant 68 year old African-American female with a history of hypertension, insulin-dependent diabetes, and chronic renal insufficiency stage IV-V.  She is followed at [facility].  She was admitted September 07, 2018 with acute systolic heart failure and poorly controlled hypertension.  Ejection fraction by echo was 35 to 40%.  Her admission weight was 225, today she is 218.  During her hospitalization her troponin went to 1.27.  She had no symptoms of acute coronary syndrome.  It suspected her troponin elevation was secondary to hypertension, congestive heart failure, and chronic renal insufficiency.  The plan is for an outpatient Myoview which is scheduled.  She is in the office today for follow-up.  Since discharge her main complaint is fatigue with exertion.  She denies any further shortness of breath that she had on admission.  She denies any chest pain or tightness.  Since discharge she has been seen at [facility].  She tells me she had labs drawn, they will see her again in March.  She knows dialysis will need to be considered in the near future.   Current Outpatient Medications  Medication Sig Dispense Refill  . Acetaminophen 500 MG coapsule Take 1,000 mg by mouth every 6 (six) hours as needed for mild pain or moderate pain.     Marland Kitchen albuterol (PROVENTIL HFA;VENTOLIN HFA) 108 (90 Base) MCG/ACT inhaler Inhale 2 puffs into the lungs every 6 (six) hours as needed for wheezing or shortness of breath.    Marland Kitchen amLODipine (NORVASC) 10 MG tablet Take 10 mg by mouth daily.    Marland Kitchen aspirin EC 81 MG tablet Take 81 mg by mouth daily.    . carvedilol (COREG) 12.5 MG tablet Take 1 tablet (12.5 mg total) by mouth 2 (two) times daily with a meal. 60 tablet 0  . Cholecalciferol (VITAMIN D) 125 MCG (5000 UT) CAPS Take 5,000 Units by mouth daily.     .  dorzolamide (TRUSOPT) 2 % ophthalmic solution Place 1 drop into both eyes 2 (two) times daily.    Marland Kitchen FLUoxetine (PROZAC) 20 MG capsule Take 20 mg by mouth daily.    . furosemide (LASIX) 80 MG tablet Take 2 tablets (160 mg total) by mouth 2 (two) times daily. 120 tablet 0  . gabapentin (NEURONTIN) 300 MG capsule Take 1 capsule (300 mg total) by mouth daily. 30 capsule 0  . hydrALAZINE (APRESOLINE) 25 MG tablet Take 1 tablet (25 mg total) by mouth 2 (two) times daily. 60 tablet 6  . insulin detemir (LEVEMIR) 100 UNIT/ML injection Inject 0.15 mLs (15 Units total) into the skin daily. 10 mL 0  . latanoprost (XALATAN) 0.005 % ophthalmic solution Place 1 drop into both eyes at bedtime.    . liraglutide (VICTOZA) 18 MG/3ML SOPN Inject 1.8 mg into the skin every evening.     . Multiple Vitamin (MULTIVITAMIN WITH MINERALS) TABS tablet Take 1 tablet by mouth daily. woman's    . omeprazole (PRILOSEC) 20 MG capsule Take 40 mg by mouth daily.     . Rosuvastatin Calcium 40 MG CPSP Take 40 mg by mouth at bedtime.     . vitamin B-12 (CYANOCOBALAMIN) 1000 MCG tablet Take 1,000 mcg by mouth daily.     No current facility-administered medications for this visit.     No Known Allergies  Past Medical History:  Diagnosis Date  .  Arthritis   . Asthma   . Depression   . Diabetes mellitus without complication ([place])    Type II  . GERD (gastroesophageal reflux disease)   . Hypertension   . Renal disorder    CKD 5    Social History   Socioeconomic History  . Marital status: Widowed    Spouse name: Not on file  . Number of children: Not on file  . Years of education: Not on file  . Highest education level: Not on file  Occupational History  . Not on file  Social Needs  . Financial resource strain: Not on file  . Food insecurity:    Worry: Not on file    Inability: Not on file  . Transportation needs:    Medical: Not on file    Non-medical: Not on file  Tobacco Use  . Smoking status: Former Smoker     Packs/day: 0.25    Years: 30.00    Pack years: 7.50    Types: Cigarettes    Last attempt to quit: 09/17/2018    Years since quitting: 0.0  . Smokeless tobacco: Never Used  Substance and Sexual Activity  . Alcohol use: Yes    Comment: seldom  . Drug use: Never  . Sexual activity: Not on file  Lifestyle  . Physical activity:    Days per week: Not on file    Minutes per session: Not on file  . Stress: Not on file  Relationships  . Social connections:    Talks on phone: Not on file    Gets together: Not on file    Attends religious service: Not on file    Active member of club or organization: Not on file    Attends meetings of clubs or organizations: Not on file    Relationship status: Not on file  . Intimate partner violence:    Fear of current or ex partner: Not on file    Emotionally abused: Not on file    Physically abused: Not on file    Forced sexual activity: Not on file  Other Topics Concern  . Not on file  Social History Narrative  . Not on file     Family History  Problem Relation Age of Onset  . Peripheral vascular disease Mother   . Hypertension Mother      Review of Systems: General: negative for chills, fever, night sweats or weight changes.  Cardiovascular: negative for chest pain, edema, orthopnea, palpitations, paroxysmal nocturnal dyspnea Dermatological: negative for rash Respiratory: negative for cough or wheezing Urologic: negative for hematuria Abdominal: negative for nausea, vomiting, diarrhea, bright red blood per rectum, melena, or hematemesis Neurologic: negative for visual changes, syncope, or dizziness All other systems reviewed and are otherwise negative except as noted above.    Blood pressure (!) 117/58, pulse 76, height 5\' 8"  (1.727 m), weight 218 lb (98.9 kg).  B/P 135/65 laying, 117/58 sitting, 97/52 standing with dizziness   General appearance: alert, cooperative and no distress Lungs: clear to auscultation  bilaterally Heart: regular rate and rhythm and 2/6 systolic murmur LSB Extremities: AVF LUE Skin: Skin color, texture, turgor normal. No rashes or lesions Neurologic: Grossly normal   ASSESSMENT AND PLAN:   Acute combined systolic and diastolic heart failure [facility]) Hospitalized Jan 10th with CHF, acute on CRI, and uncontrolled HTN  Demand ischemia (HCC) Troponin 1.27 but no chest pain- felt to be secondary to CHF, HTN, and AKI Myoview scheduled   Cardiomyopathy ([place]) EF 35-40%-  etiology not yet determined  Chronic kidney disease (CKD), stage IV (severe) ([place]) Seen at [facility]- AVF placed Oct 2019  Insulin dependent diabetes mellitus with complications (HCC) IDDM with CRI  Essential hypertension Orthostatic with symptoms in the office today- will back off Hydralazine   PLAN  Decrease hydralazine secondary to fatigue and orthostatic symptoms.  Proceed with Myoview as scheduled to r/o underlying CAD. She understands that if she needs a cath it will most likely result in dialysis. F/U Dr Priscilla Davis in March (after she is seen by her nephrologist).   Kerin Ransom PA-C 09/24/2018 9:33 AM

## 2018-09-24 NOTE — Assessment & Plan Note (Signed)
Seen at [facility]- AVF placed Oct 2019

## 2018-09-24 NOTE — Assessment & Plan Note (Signed)
IDDM with CRI

## 2018-09-24 NOTE — Assessment & Plan Note (Addendum)
Orthostatic with symptoms in the office today- will back off Hydralazine

## 2018-09-24 NOTE — Patient Instructions (Signed)
Medication Instructions:  DECREASE Hydralazine to 25mg  Take 1 tablet twice a day  If you need a refill on your cardiac medications before your next appointment, please call your pharmacy.   Lab work: None  If you have labs (blood work) drawn today and your tests are completely normal, you will receive your results only by: Marland Kitchen MyChart Message (if you have MyChart) OR . A paper copy in the mail If you have any lab test that is abnormal or we need to change your treatment, we will call you to review the results.  Testing/Procedures: None  Follow-Up: At [facility], you and your health needs are our priority.  As part of our continuing mission to provide you with exceptional heart care, we have created designated Provider Care Teams.  These Care Teams include your primary Cardiologist (physician) and Advanced Practice Providers (APPs -  Physician Assistants and Nurse Practitioners) who all work together to provide you with the care you need, when you need it. Your physician recommends that you schedule a follow-up appointment in: March 2020 with Dr Oval Linsey ONLY  Any Other Special Instructions Will Be Listed Below (If Applicable).

## 2018-09-24 NOTE — Assessment & Plan Note (Signed)
Hospitalized Jan 10th with CHF, acute on CRI, and uncontrolled HTN

## 2018-09-24 NOTE — Assessment & Plan Note (Signed)
Troponin 1.27 but no chest pain- felt to be secondary to CHF, HTN, and AKI Myoview scheduled

## 2018-09-26 ENCOUNTER — Telehealth (HOSPITAL_COMMUNITY): Payer: Self-pay

## 2018-09-26 NOTE — Telephone Encounter (Signed)
Encounter complete. 

## 2018-09-27 ENCOUNTER — Ambulatory Visit (HOSPITAL_COMMUNITY)
Admission: RE | Admit: 2018-09-27 | Discharge: 2018-09-27 | Disposition: A | Discharge status: Home / Self Care | Payer: Medicare Other | Source: Ambulatory Visit | Attending: Cardiology | Admitting: Cardiology

## 2018-09-27 ENCOUNTER — Encounter: Payer: Self-pay | Admitting: Cardiovascular Disease

## 2018-09-27 DIAGNOSIS — I5021 Acute systolic (congestive) heart failure: Secondary | ICD-10-CM

## 2018-09-28 ENCOUNTER — Ambulatory Visit (HOSPITAL_COMMUNITY)
Admission: RE | Admit: 2018-09-28 | Discharge: 2018-09-28 | Disposition: A | Discharge status: Home / Self Care | Payer: Medicare Other | Source: Ambulatory Visit | Attending: Cardiology | Admitting: Cardiology

## 2018-09-28 DIAGNOSIS — I5021 Acute systolic (congestive) heart failure: Secondary | ICD-10-CM | POA: Insufficient documentation

## 2018-09-28 MED ORDER — REGADENOSON 0.4 MG/5ML IV SOLN
0.4000 mg | Freq: Once | INTRAVENOUS | Status: AC
  Administered 2018-09-28: 0.4 mg via INTRAVENOUS

## 2018-09-28 MED ORDER — TECHNETIUM TC 99M TETROFOSMIN IV KIT
32.3000 | PACK | Freq: Once | INTRAVENOUS | Status: AC | PRN
  Administered 2018-09-28: 32.3 via INTRAVENOUS
  Filled 2018-09-28: qty 33

## 2018-09-28 MED ORDER — AMINOPHYLLINE 25 MG/ML IV SOLN
75.0000 mg | Freq: Once | INTRAVENOUS | Status: AC
  Administered 2018-09-28: 75 mg via INTRAVENOUS

## 2018-09-28 MED ORDER — TECHNETIUM TC 99M TETROFOSMIN IV KIT
10.6000 | PACK | Freq: Once | INTRAVENOUS | Status: AC | PRN
  Administered 2018-09-28: 10.6 via INTRAVENOUS
  Filled 2018-09-28: qty 11

## 2018-10-03 LAB — MYOCARDIAL PERFUSION IMAGING
CHL CUP NUCLEAR SRS: 9
LV dias vol: 191 mL (ref 46–106)
LV sys vol: 108 mL
Peak HR: 85 {beats}/min
Rest HR: 78 {beats}/min
SDS: 1
SSS: 10
TID: 0.98

## 2018-10-15 ENCOUNTER — Encounter: Payer: Self-pay | Admitting: *Deleted

## 2018-10-17 NOTE — Telephone Encounter (Signed)
This encounter was created in error - please disregard.

## 2018-10-22 ENCOUNTER — Telehealth: Payer: Self-pay | Admitting: Cardiology

## 2018-10-24 NOTE — Telephone Encounter (Signed)
Error in charting.

## 2018-11-28 ENCOUNTER — Encounter: Payer: Self-pay | Admitting: Cardiovascular Disease

## 2018-11-28 ENCOUNTER — Telehealth (INDEPENDENT_AMBULATORY_CARE_PROVIDER_SITE_OTHER): Payer: Medicare Other | Admitting: Cardiovascular Disease

## 2018-11-28 VITALS — BP 106/68 | HR 67 | Ht 69.0 in | Wt 205.0 lb

## 2018-11-28 DIAGNOSIS — I10 Essential (primary) hypertension: Secondary | ICD-10-CM

## 2018-11-28 DIAGNOSIS — I2510 Atherosclerotic heart disease of native coronary artery without angina pectoris: Secondary | ICD-10-CM | POA: Diagnosis not present

## 2018-11-28 DIAGNOSIS — I110 Hypertensive heart disease with heart failure: Secondary | ICD-10-CM | POA: Diagnosis not present

## 2018-11-28 DIAGNOSIS — I5042 Chronic combined systolic (congestive) and diastolic (congestive) heart failure: Secondary | ICD-10-CM | POA: Diagnosis not present

## 2018-11-28 DIAGNOSIS — N186 End stage renal disease: Secondary | ICD-10-CM

## 2018-11-28 DIAGNOSIS — I5041 Acute combined systolic (congestive) and diastolic (congestive) heart failure: Secondary | ICD-10-CM

## 2018-11-28 HISTORY — DX: Acute combined systolic (congestive) and diastolic (congestive) heart failure: I50.41

## 2018-11-28 NOTE — Progress Notes (Signed)
Virtual Visit via Video Note    Evaluation Performed:  Follow-up visit  This visit type was conducted due to national recommendations for restrictions regarding the COVID-19 Pandemic (e.g. social distancing).  This format is felt to be most appropriate for this patient at this time.  All issues noted in this document were discussed and addressed.  No physical exam was performed (except for noted visual exam findings with Video Visits).  Please refer to the patient's chart (MyChart message for video visits and phone note for telephone visits) for the patient's consent to telehealth for [facility]  Date:  11/28/2018   ID:  Priscilla Davis, DOB 16-Jan-1951, MRN [id number]  Patient Location:  home  Provider location:   office  PCP:  Kristen Loader, FNP  Cardiologist:  Skeet Latch, MD  Electrophysiologist:  None   Chief Complaint:  Follow up  History of Present Illness:    Priscilla Davis is a 68 y.o. female who presents via video conferencing for a telehealth visit today.    Priscilla Davis is a 25F with chronic systolic and diastolic heart failure, hypertension, diabetes, and CKD 4-5 presenting for follow-up appointment.  She was admitted 08/2018 with acute systolic and diastolic heart failure.  Her LVEF was 35 to 40% that admission.  She required diuresis with IV Lasix.  Troponin was elevated to 1.27.  Her troponin was thought to be elevated due to demand ischemia in the setting of heart failure and CKD.  She had an outpatient Lexiscan Myoview that revealed LVEF 44% with a nonreversible defect in the basal to mid inferior, inferolateral and anterolateral regions.  Findings were concerning for prior infarct but no ischemia.  She followed up with Kerin Ransom, PA-C on 08/2018, at which time she reported fatigue with exertion but was otherwise well.  She has been following with nephrology.  She did report orthostatic symptoms and hydralazine was decreased.  Since her last appointment Ms. Casali  started on HD Tu/Th/Sa.  She has been going 3 weeks and is up to 4 hour sessions.  She continues to feel all the time and her nephrologist is hopeful that this will improve with time.  She has some shortness of breath with exertion but no chest pain.  She denies palpitations, lightheadedness or dizziness.  Hydralazine was discontinued yesterday due to low blood pressures during dialysis.  She has not experienced any lower extremity edema, orthopnea, or PND.  She does complain that her appetite has been poor and that she is nauseous, though this is improving.  She no longer has emesis, though she had this prior to starting HD.  On average her blood pressures been running in the 836O systolic.  She is still making urine.   The patient does not have symptoms concerning for COVID-19 infection (fever, chills, cough, or new shortness of breath).    Prior CV studies:   The following studies were reviewed today:  Echo 09/10/2018: Study Conclusions  - Left ventricle: The cavity size was normal. Wall thickness was   increased in a pattern of mild LVH. Systolic function was   moderately reduced. The estimated ejection fraction was in the   range of 35% to 40%. Diffuse hypokinesis. Doppler parameters are   consistent with abnormal left ventricular relaxation (grade 1   diastolic dysfunction). The E/e&' ratio is between 8-15,   suggesting indeterminate LV filling pressure. - Aortic valve: Sclerosis without stenosis. There was no   regurgitation. - Mitral valve: Mildly thickened leaflets . There  was trivial   regurgitation. - Left atrium: Moderately dilated. - Inferior vena cava: The vessel was normal in size. The   respirophasic diameter changes were in the normal range (>= 50%),   consistent with normal central venous pressure.  Impressions:  - LVEF 35-40%, mild LVH, global hypokinesis, grade 1 DD,   indeterminate LV filling pressure, trivial MR, moderate LAE,   normal IVC.  Lexiscan Myoview  09/28/2018:  No changed from baseline EKG showing NSR with nonspecific ST/T wave abnormality in the inferolateral leads  Nuclear stress EF: 44%. There is hypokinesis of the basal and mid inferior and inferolateral walls.  The left ventricular ejection fraction is moderately decreased (30-44%).  There is a large defect of moderate severity present in the basal inferior, basal inferolateral, basal anterolateral, mid inferior and mid inferolateral location. The defect is non-reversible and consistent with prior infarct. No ischemia noted.  This is an intermediate risk study.  There is evidence of focal spots of abnormal radiotracer uptake in blateral left and right breast area. Further workup with mammogram recommended.     Past Medical History:  Diagnosis Date  . Arthritis   . Asthma   . Depression   . Diabetes mellitus without complication ([place])    Type II  . GERD (gastroesophageal reflux disease)   . Hypertension   . Renal disorder    CKD 5   Past Surgical History:  Procedure Laterality Date  . AV FISTULA PLACEMENT Left 06/19/2018   Procedure: INSERTION OF 4-7MM X 45CM ARTERIOVENOUS (AV) GORE-TEX GRAFT LEFT  ARM;  Surgeon: Elam Dutch, MD;  Location: [facility];  Service: Vascular;  Laterality: Left;  . CESAREAN SECTION    . CHOLECYSTECTOMY    . COLONOSCOPY    . EYE SURGERY Bilateral    diabetic   . toe amputation Right    5th toe     Current Meds  Medication Sig  . Acetaminophen 500 MG coapsule Take 1,000 mg by mouth every 6 (six) hours as needed for mild pain or moderate pain.   Marland Kitchen albuterol (PROVENTIL HFA;VENTOLIN HFA) 108 (90 Base) MCG/ACT inhaler Inhale 2 puffs into the lungs every 6 (six) hours as needed for wheezing or shortness of breath.  Marland Kitchen amLODipine (NORVASC) 10 MG tablet Take 10 mg by mouth daily. AT BEDTIME  . aspirin EC 81 MG tablet Take 81 mg by mouth daily.  . carvedilol (COREG) 12.5 MG tablet Take 1 tablet (12.5 mg total) by mouth 2 (two) times daily  with a meal.  . Cholecalciferol (VITAMIN D) 125 MCG (5000 UT) CAPS Take 5,000 Units by mouth daily.   . dorzolamide (TRUSOPT) 2 % ophthalmic solution Place 1 drop into both eyes 2 (two) times daily.  Marland Kitchen FLUoxetine (PROZAC) 20 MG capsule Take 20 mg by mouth daily.  . furosemide (LASIX) 80 MG tablet Take 2 tablets (160 mg total) by mouth 2 (two) times daily.  Marland Kitchen gabapentin (NEURONTIN) 300 MG capsule Take 1 capsule (300 mg total) by mouth daily.  . insulin detemir (LEVEMIR) 100 UNIT/ML injection Inject 0.15 mLs (15 Units total) into the skin daily.  Marland Kitchen latanoprost (XALATAN) 0.005 % ophthalmic solution Place 1 drop into both eyes at bedtime.  . liraglutide (VICTOZA) 18 MG/3ML SOPN Inject 1.8 mg into the skin every evening.   . Multiple Vitamin (MULTIVITAMIN WITH MINERALS) TABS tablet Take 1 tablet by mouth daily. woman's  . omeprazole (PRILOSEC) 20 MG capsule Take 40 mg by mouth daily.   . Rosuvastatin Calcium  40 MG CPSP Take 40 mg by mouth at bedtime.   . vitamin B-12 (CYANOCOBALAMIN) 1000 MCG tablet Take 1,000 mcg by mouth daily.     Allergies:   Patient has no known allergies.   Social History   Tobacco Use  . Smoking status: Former Smoker    Packs/day: 0.25    Years: 30.00    Pack years: 7.50    Types: Cigarettes    Last attempt to quit: 09/17/2018    Years since quitting: 0.1  . Smokeless tobacco: Never Used  Substance Use Topics  . Alcohol use: Yes    Comment: seldom  . Drug use: Never     Family Hx: The patient's family history includes Hypertension in her mother; Peripheral vascular disease in her mother.  ROS:   Please see the history of present illness.     All other systems reviewed and are negative.   Labs/Other Tests and Data Reviewed:    Recent Labs: 06/10/2018: ALT 12 09/09/2018: Magnesium 2.0 09/11/2018: Hemoglobin 9.5; Platelets 320 09/12/2018: BUN 77; Creatinine, Ser 5.45; Potassium 4.1; Sodium 142   Recent Lipid Panel Lab Results  Component Value Date/Time    CHOL 102 09/10/2018 03:44 AM   TRIG 60 09/10/2018 03:44 AM   HDL 45 09/10/2018 03:44 AM   CHOLHDL 2.3 09/10/2018 03:44 AM   LDLCALC 45 09/10/2018 03:44 AM    Wt Readings from Last 3 Encounters:  11/28/18 205 lb (93 kg)  09/28/18 218 lb (98.9 kg)  09/24/18 218 lb (98.9 kg)     Objective:    BP 106/68   Pulse 67   Ht 5\' 9"  (1.753 m)   Wt 205 lb (93 kg)   BMI 30.27 kg/m  GENERAL: Well-appearing.  No acute distress. HEENT: Pupils equal round.  Oral mucosa unremarkable NECK:  No jugular venous distention, no visible thyromegaly EXT:  No edema, no cyanosis no clubbing SKIN:  No rashes no nodules NEURO:  Speech fluent.  Cranial nerves grossly intact.  Moves all 4 extremities freely PSYCH:  Cognitively intact, oriented to person place and time   ASSESSMENT & PLAN:    # Chronic systolic and diastolic heart failure: # Hypertension: Volume status is stable.  She is tolerating her hemodialysis sessions well.  Her antihypertensives have been reduced due to fatigue and low blood pressure.  Hydralazine was just off yesterday.  Will not make any other changes at this time.  Continue carvedilol.  Now that she is on dialysis we could consider getting her off amlodipine and onto an ACE inhibitor/ARB.  However she does continue to make urine and this may not be preferable with nephrology.  Continue current meds for now.  Her stress test was concerning for prior infarct.  We will arrange for her to get a cardiac catheterization.  However we are not doing elective heart caths in the setting of COVID-19.  Plan to schedule cath at follow up.  # Demand ischemia:   Troponin was elevated in the hospital in the setting of demand ischemia.  However her post discharge stress test was concerning for prior infarct.  Plan for cath as above.   COVID-19 Education: The signs and symptoms of COVID-19 were discussed with the patient and how to seek care for testing (follow up with PCP or arrange E-visit).  The  importance of social distancing was discussed today.  Patient Risk:   After full review of this patient's clinical status, I feel that they are at least moderate risk at  this time.  Time:   Today, I have spent 25 minutes with the patient with telehealth technology discussing heart failure, hypertension, and heart cath.     Medication Adjustments/Labs and Tests Ordered: Current medicines are reviewed at length with the patient today.  Concerns regarding medicines are outlined above.   Tests Ordered: No orders of the defined types were placed in this encounter.  Medication Changes: No orders of the defined types were placed in this encounter.   Disposition:  Follow up with Kerin Ransom, PA-C in 2 months.  Clifton Kovacic C. Oval Linsey, MD, [facility] in 6 months.   Signed, Skeet Latch, MD  11/28/2018 12:15 PM    [place]

## 2018-11-28 NOTE — Patient Instructions (Signed)
Medication Instructions:  Your physician recommends that you continue on your current medications as directed. Please refer to the Current Medication list given to you today.  If you need a refill on your cardiac medications before your next appointment, please call your pharmacy.   Lab work: none  Testing/Procedures: none  Follow-Up: At [facility], you and your health needs are our priority.  As part of our continuing mission to provide you with exceptional heart care, we have created designated Provider Care Teams.  These Care Teams include your primary Cardiologist (physician) and Advanced Practice Providers (APPs -  Physician Assistants and Nurse Practitioners) who all work together to provide you with the care you need, when you need it. You will need a follow up appointment in 2 months with Kerin Ransom, PA-C  If you do not hear from the office regarding appointment call us at [phone]

## 2018-11-29 IMAGING — DX DG ABDOMEN ACUTE W/ 1V CHEST
4 series · 4 of 4 positions shown · non-contrast
Comparison: None.

CLINICAL DATA: Intermittent lower abdominal pain.  Diarrhea.

EXAM:
DG ABDOMEN ACUTE W/ 1V CHEST

[chest pa]
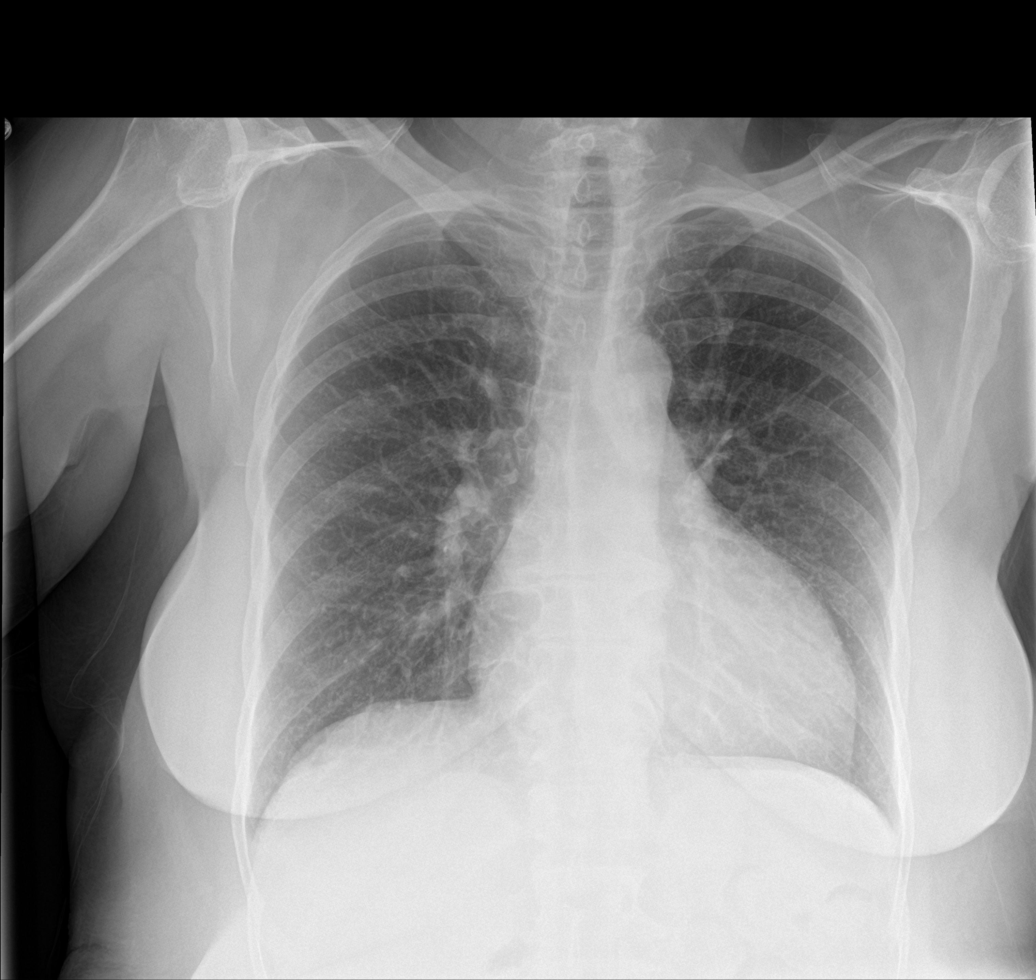

[abdomen erect]
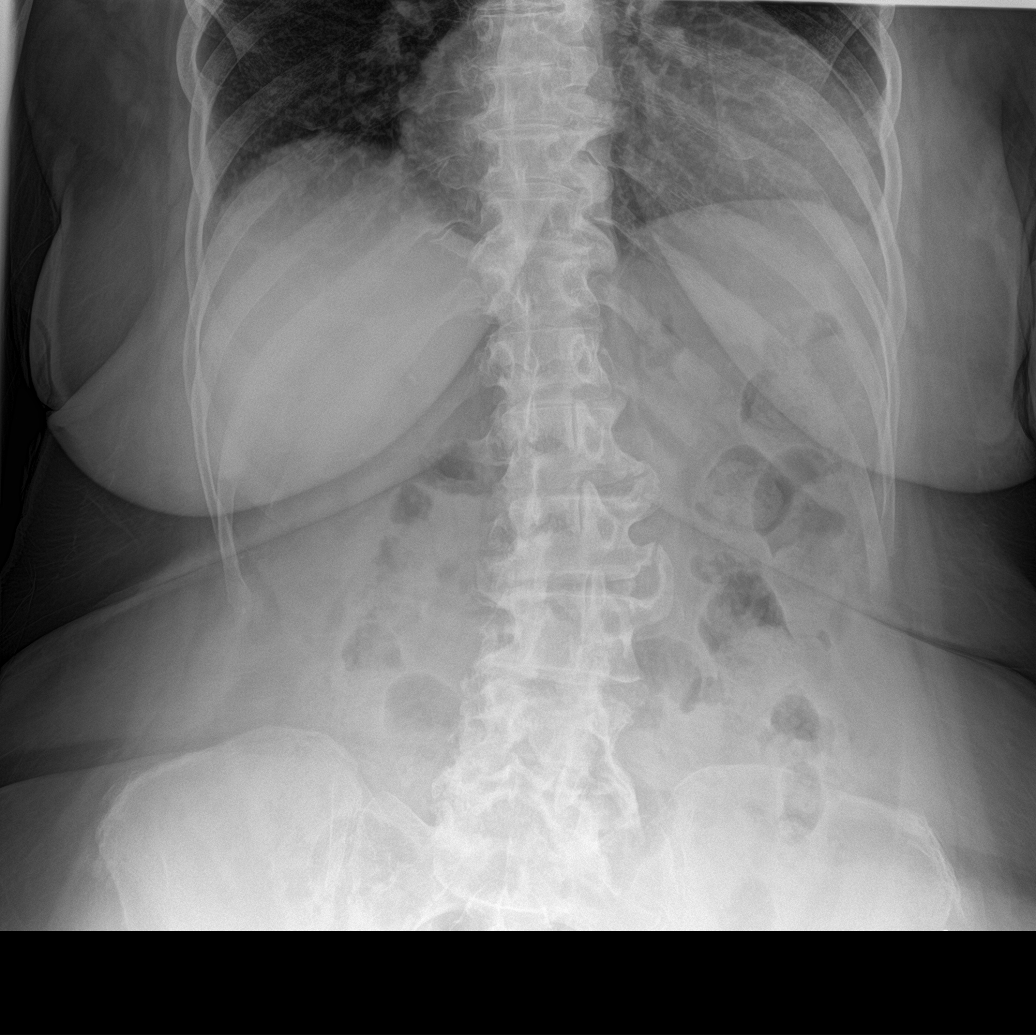

[abdomen supine (1 of 2)]
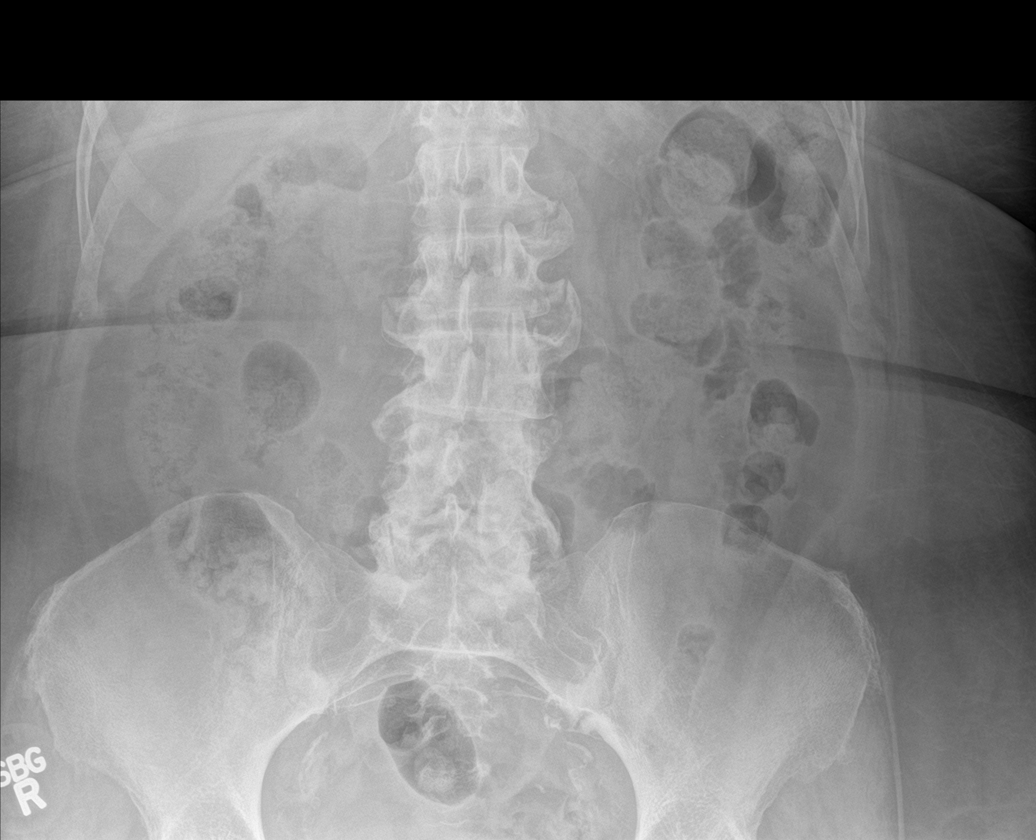

[abdomen supine (2 of 2)]
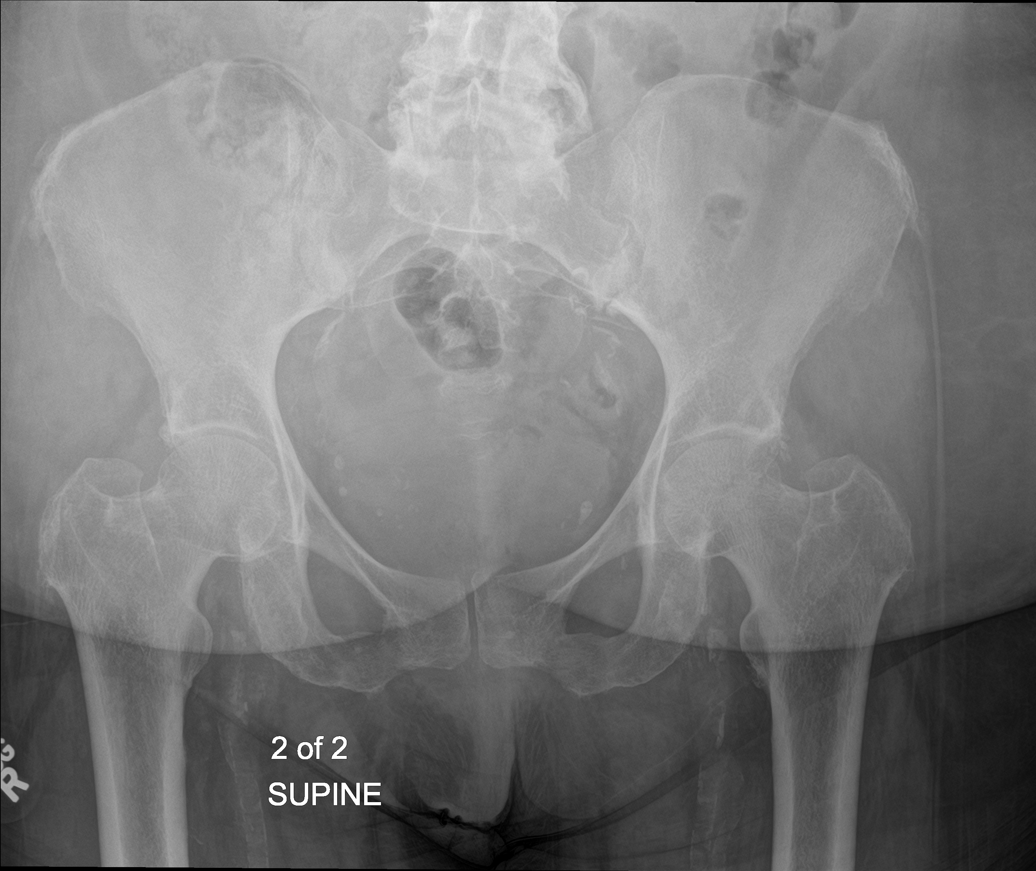

[4 of 4 positions shown; findings below may reference images not displayed]

FINDINGS: Enlarged cardiac and mediastinal contours. No consolidative
pulmonary opacities. No pleural effusion or pneumothorax.

Large amount of stool throughout the colon. Gas is demonstrated
within nondilated loops of large and small bowel in a nonobstructed
pattern. Lumbar spine and thoracic spine degenerative changes.
Pelvic phleboliths. SI joints unremarkable.
IMPRESSION: No acute cardiopulmonary process.  Cardiomegaly.

Stool throughout the colon as can be seen with constipation.

Nonobstructed bowel gas pattern.

## 2018-11-29 IMAGING — CT CT ABD-PELV W/O CM
2 of 4 series · 16 of 46 positions shown, 18 images · non-contrast
Comparison: None.

CLINICAL DATA: Lower abdominal pain for 2 months with diarrhea

EXAM:
CT ABDOMEN AND PELVIS WITHOUT CONTRAST
TECHNIQUE: Multidetector CT imaging of the abdomen and pelvis was performed
following the standard protocol without IV contrast.

[Series 3: a/p w/o 5mm · axial · non-contrast · 0.98mm/px · z∈[+1009,+1459]mm · 13 of 98 slices shown, 15 images]
[im 4/98  soft-tissue]
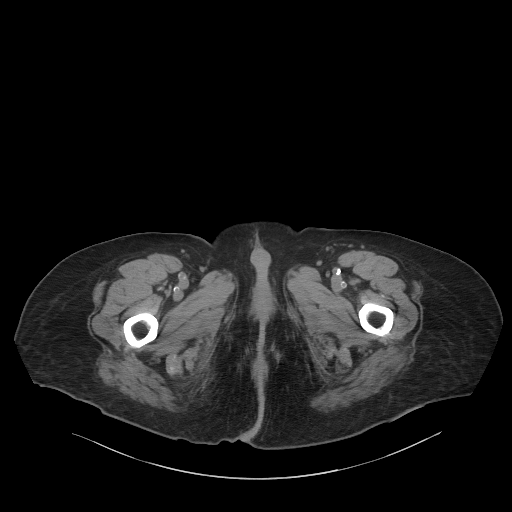
[im 4/98  bone]
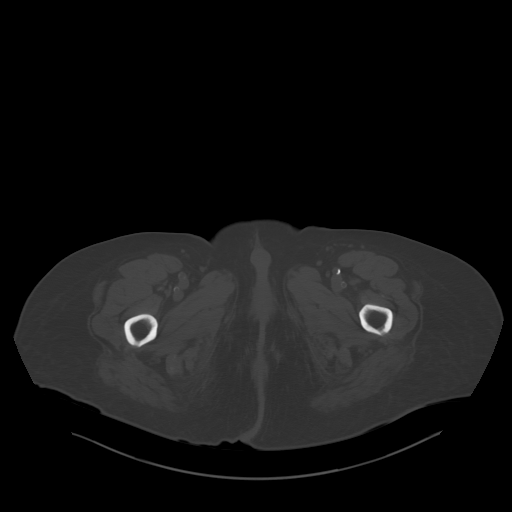
[im 12/98  soft-tissue]
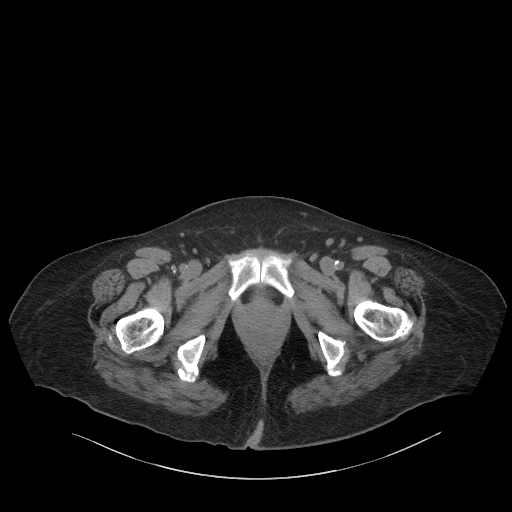
[im 20/98  soft-tissue]
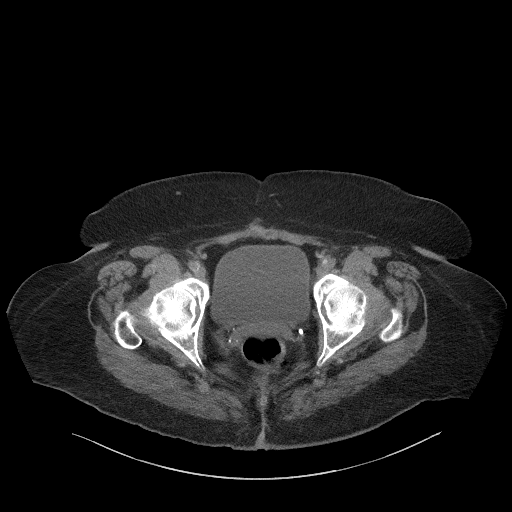
[im 28/98  soft-tissue]
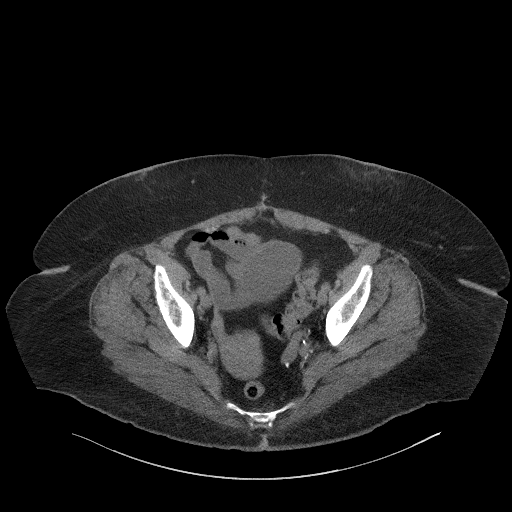
[im 35/98  soft-tissue]
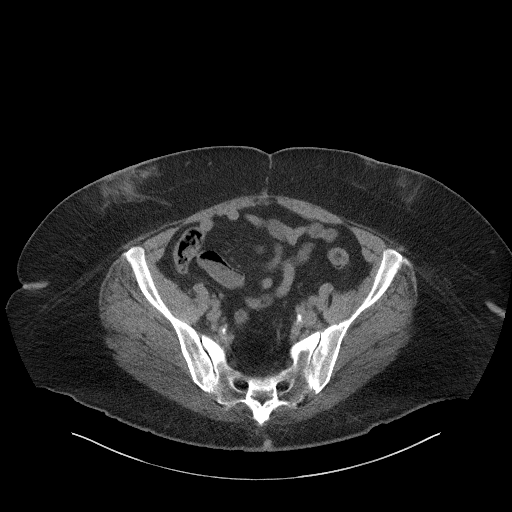
[im 43/98  soft-tissue]
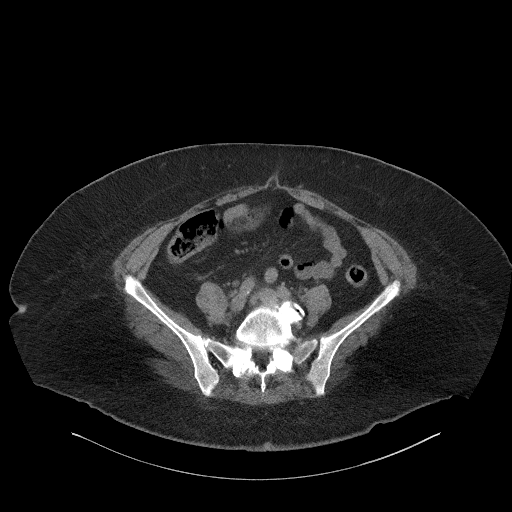
[im 51/98  soft-tissue]
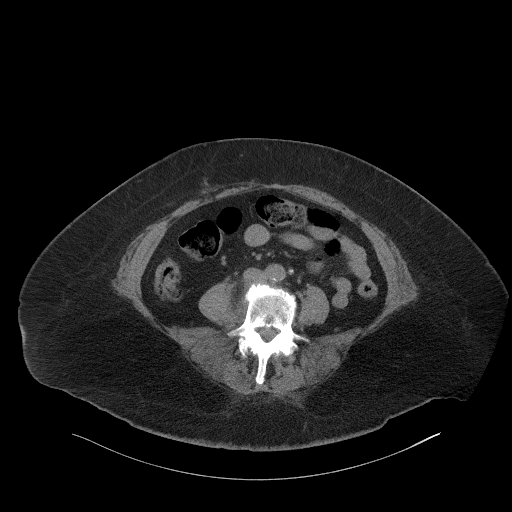
[im 55/98  soft-tissue]
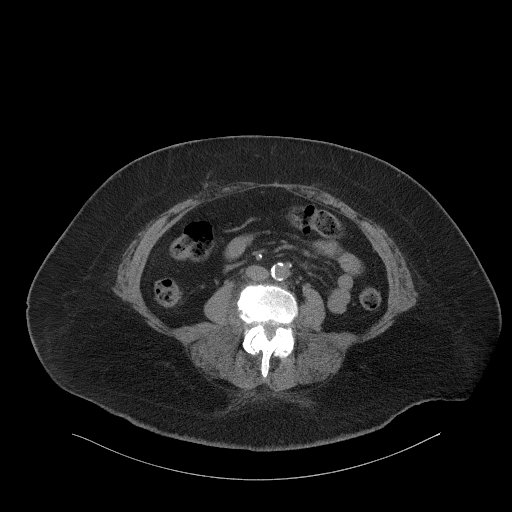
[im 63/98  soft-tissue]
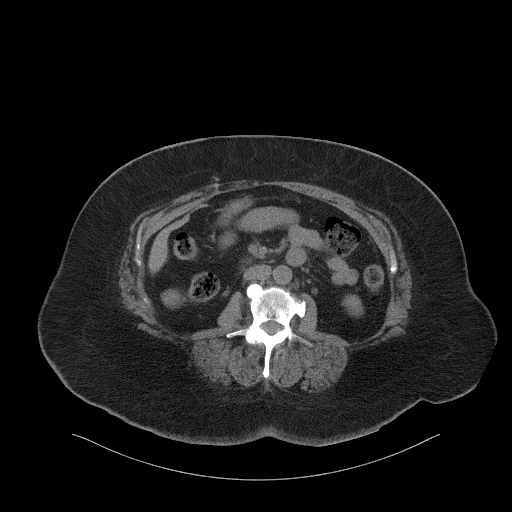
[im 63/98  bone]
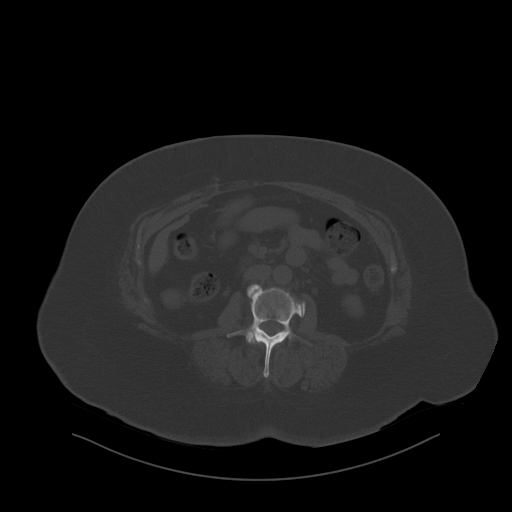
[im 70/98  soft-tissue]
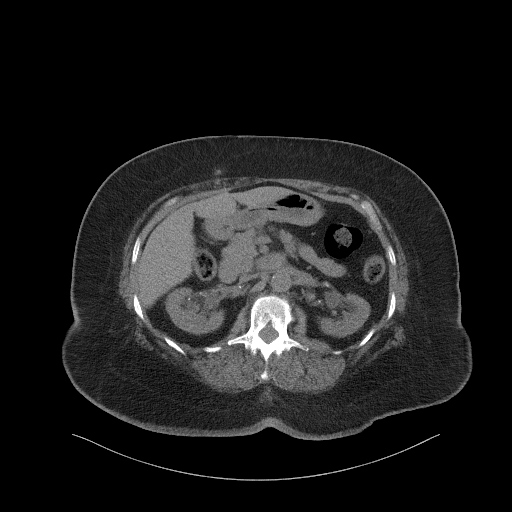
[im 78/98  soft-tissue]
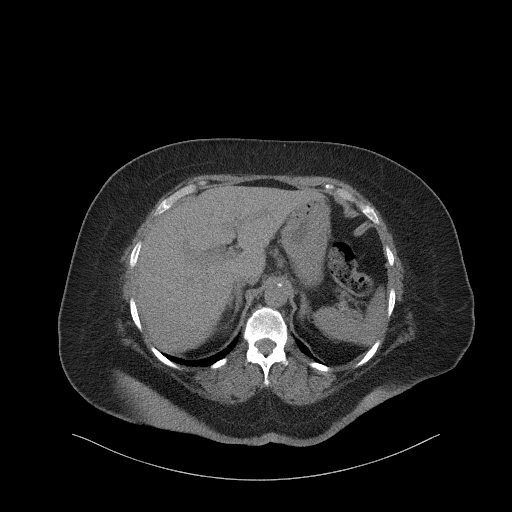
[im 86/98  soft-tissue]
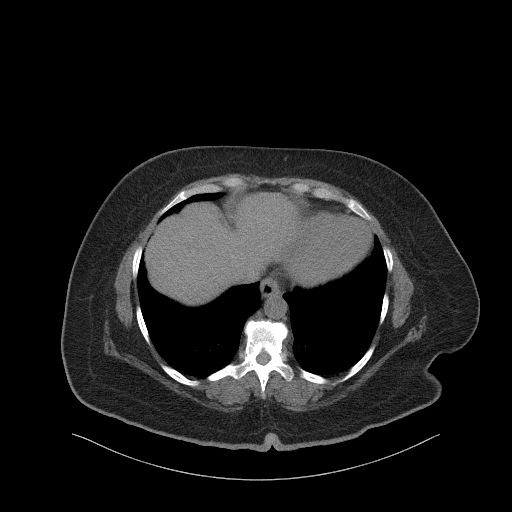
[im 94/98  soft-tissue]
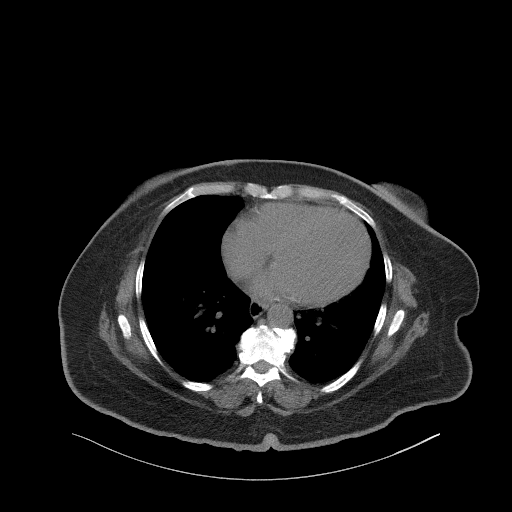

[Series 6: a/p w/o cor · coronal · non-contrast · 0.83mm/px · 3 of 175 slices shown]
[im 59/175  soft-tissue]
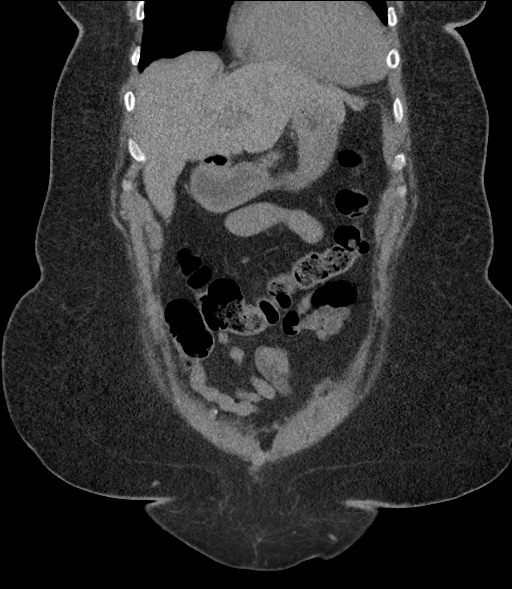
[im 78/175  soft-tissue]
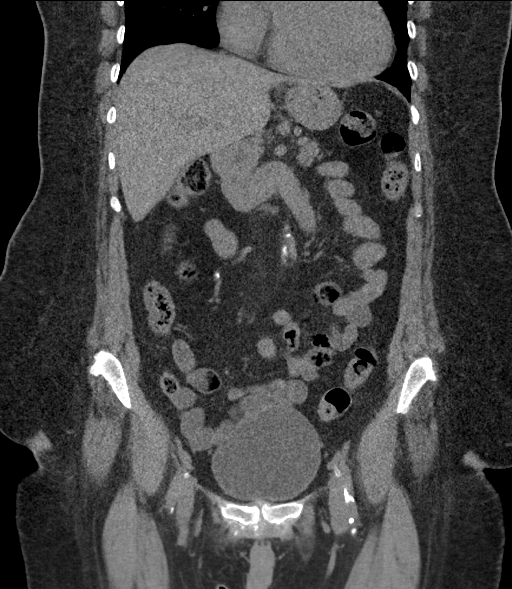
[im 97/175  soft-tissue]
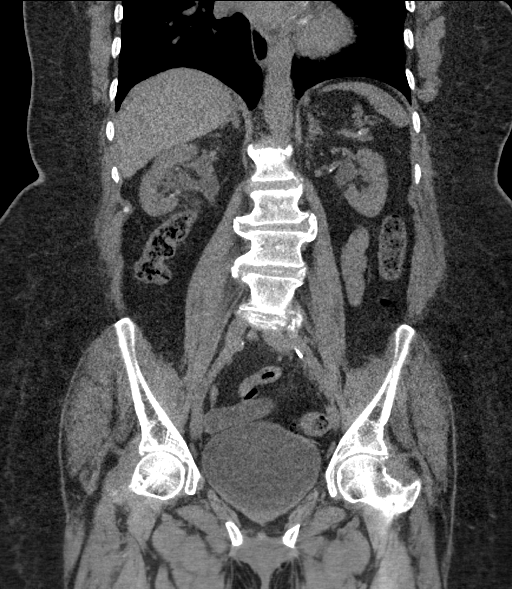

[16 of 46 positions shown; findings below may reference images not displayed]

FINDINGS: Lower chest: No acute abnormality.

Hepatobiliary: No focal liver abnormality is seen. Status post
cholecystectomy. No biliary dilatation.

Pancreas: Unremarkable. No pancreatic ductal dilatation or
surrounding inflammatory changes.

Spleen: Normal in size without focal abnormality.

Adrenals/Urinary Tract: Adrenal glands are within normal limits.
Kidneys are well visualized with tiny nonobstructing renal stones
bilaterally. Extrarenal pelves are noted bilaterally as well as
renal vascular calcifications. No definitive ureteral stones are
seen. The bladder is partially distended.

Stomach/Bowel: The appendix is well visualized and within normal
limits. No obstructive or inflammatory changes of the bowel are
seen.

Vascular/Lymphatic: Aortic atherosclerosis. No enlarged abdominal or
pelvic lymph nodes.

Reproductive: Uterus and bilateral adnexa are unremarkable.

Other: No abdominal wall hernia or abnormality. No abdominopelvic
ascites.

Musculoskeletal: Degenerative changes of lumbar spine and sacroiliac
joints are noted. Soft tissue changes are noted in the anterior
abdominal wall likely related to injections given its symmetrical
nature.
IMPRESSION: Bilateral nonobstructing renal stones.

No acute abnormality is identified correspond with patient's given
clinical history.

## 2018-11-30 ENCOUNTER — Telehealth: Payer: Self-pay | Admitting: Cardiovascular Disease

## 2018-11-30 NOTE — Telephone Encounter (Signed)
Left message for patient to call back.  She needs fu with Priscilla Davis in May.

## 2018-12-01 ENCOUNTER — Inpatient Hospital Stay (HOSPITAL_COMMUNITY)
Admission: EM | Admit: 2018-12-01 | Discharge: 2018-12-12 | DRG: 246 | Disposition: A | Discharge status: Home Health | Payer: Medicare Other | Attending: Internal Medicine | Admitting: Internal Medicine

## 2018-12-01 ENCOUNTER — Encounter (HOSPITAL_COMMUNITY): Payer: Self-pay

## 2018-12-01 ENCOUNTER — Emergency Department (HOSPITAL_COMMUNITY): Payer: Medicare Other

## 2018-12-01 ENCOUNTER — Other Ambulatory Visit: Payer: Self-pay

## 2018-12-01 DIAGNOSIS — M898X9 Other specified disorders of bone, unspecified site: Secondary | ICD-10-CM | POA: Diagnosis present

## 2018-12-01 DIAGNOSIS — Z87891 Personal history of nicotine dependence: Secondary | ICD-10-CM

## 2018-12-01 DIAGNOSIS — Z8249 Family history of ischemic heart disease and other diseases of the circulatory system: Secondary | ICD-10-CM

## 2018-12-01 DIAGNOSIS — I5022 Chronic systolic (congestive) heart failure: Secondary | ICD-10-CM | POA: Diagnosis present

## 2018-12-01 DIAGNOSIS — I429 Cardiomyopathy, unspecified: Secondary | ICD-10-CM

## 2018-12-01 DIAGNOSIS — R0602 Shortness of breath: Secondary | ICD-10-CM | POA: Diagnosis present

## 2018-12-01 DIAGNOSIS — K219 Gastro-esophageal reflux disease without esophagitis: Secondary | ICD-10-CM | POA: Diagnosis present

## 2018-12-01 DIAGNOSIS — I951 Orthostatic hypotension: Secondary | ICD-10-CM | POA: Diagnosis not present

## 2018-12-01 DIAGNOSIS — E1122 Type 2 diabetes mellitus with diabetic chronic kidney disease: Secondary | ICD-10-CM | POA: Diagnosis present

## 2018-12-01 DIAGNOSIS — I200 Unstable angina: Secondary | ICD-10-CM | POA: Diagnosis not present

## 2018-12-01 DIAGNOSIS — E119 Type 2 diabetes mellitus without complications: Secondary | ICD-10-CM

## 2018-12-01 DIAGNOSIS — I255 Ischemic cardiomyopathy: Secondary | ICD-10-CM | POA: Diagnosis present

## 2018-12-01 DIAGNOSIS — J4520 Mild intermittent asthma, uncomplicated: Secondary | ICD-10-CM | POA: Diagnosis present

## 2018-12-01 DIAGNOSIS — I214 Non-ST elevation (NSTEMI) myocardial infarction: Secondary | ICD-10-CM | POA: Diagnosis present

## 2018-12-01 DIAGNOSIS — D631 Anemia in chronic kidney disease: Secondary | ICD-10-CM | POA: Diagnosis present

## 2018-12-01 DIAGNOSIS — I10 Essential (primary) hypertension: Secondary | ICD-10-CM | POA: Diagnosis present

## 2018-12-01 DIAGNOSIS — M1990 Unspecified osteoarthritis, unspecified site: Secondary | ICD-10-CM | POA: Diagnosis present

## 2018-12-01 DIAGNOSIS — I132 Hypertensive heart and chronic kidney disease with heart failure and with stage 5 chronic kidney disease, or end stage renal disease: Secondary | ICD-10-CM | POA: Diagnosis present

## 2018-12-01 DIAGNOSIS — Z7982 Long term (current) use of aspirin: Secondary | ICD-10-CM

## 2018-12-01 DIAGNOSIS — E785 Hyperlipidemia, unspecified: Secondary | ICD-10-CM | POA: Diagnosis present

## 2018-12-01 DIAGNOSIS — R079 Chest pain, unspecified: Secondary | ICD-10-CM | POA: Diagnosis present

## 2018-12-01 DIAGNOSIS — I25110 Atherosclerotic heart disease of native coronary artery with unstable angina pectoris: Secondary | ICD-10-CM | POA: Diagnosis present

## 2018-12-01 DIAGNOSIS — N186 End stage renal disease: Secondary | ICD-10-CM | POA: Diagnosis present

## 2018-12-01 DIAGNOSIS — E1143 Type 2 diabetes mellitus with diabetic autonomic (poly)neuropathy: Secondary | ICD-10-CM | POA: Diagnosis present

## 2018-12-01 DIAGNOSIS — Z955 Presence of coronary angioplasty implant and graft: Secondary | ICD-10-CM

## 2018-12-01 LAB — CBC WITH DIFFERENTIAL/PLATELET
Abs Immature Granulocytes: 0.06 10*3/uL (ref 0.00–0.07)
Basophils Absolute: 0.1 10*3/uL (ref 0.0–0.1)
Basophils Relative: 1 %
Eosinophils Absolute: 0.1 10*3/uL (ref 0.0–0.5)
Eosinophils Relative: 1 %
HCT: 34.6 % — ABNORMAL LOW (ref 36.0–46.0)
Hemoglobin: 10.6 g/dL — ABNORMAL LOW (ref 12.0–15.0)
Immature Granulocytes: 1 %
Lymphocytes Relative: 13 %
Lymphs Abs: 1.5 10*3/uL (ref 0.7–4.0)
MCH: 27.8 pg (ref 26.0–34.0)
MCHC: 30.6 g/dL (ref 30.0–36.0)
MCV: 90.8 fL (ref 80.0–100.0)
Monocytes Absolute: 0.7 10*3/uL (ref 0.1–1.0)
Monocytes Relative: 6 %
Neutro Abs: 9.1 10*3/uL — ABNORMAL HIGH (ref 1.7–7.7)
Neutrophils Relative %: 78 %
Platelets: 289 10*3/uL (ref 150–400)
RBC: 3.81 MIL/uL — ABNORMAL LOW (ref 3.87–5.11)
RDW: 14 % (ref 11.5–15.5)
WBC: 11.5 10*3/uL — ABNORMAL HIGH (ref 4.0–10.5)
nRBC: 0.2 % (ref 0.0–0.2)

## 2018-12-01 LAB — TROPONIN I
Troponin I: <0.03 ng/mL (ref <0.03)
Troponin I: <0.03 ng/mL (ref <0.03)
Troponin I: <0.03 ng/mL (ref <0.03)

## 2018-12-01 LAB — COMPREHENSIVE METABOLIC PANEL
ALT: 26 U/L (ref 0–44)
AST: 37 U/L (ref 15–41)
Albumin: 3.5 g/dL (ref 3.5–5.0)
Alkaline Phosphatase: 63 U/L (ref 38–126)
Anion gap: 18 — ABNORMAL HIGH (ref 5–15)
BUN: 21 mg/dL (ref 8–23)
CO2: 21 mmol/L — ABNORMAL LOW (ref 22–32)
Calcium: 9.3 mg/dL (ref 8.9–10.3)
Chloride: 98 mmol/L (ref 98–111)
Creatinine, Ser: 5.35 mg/dL — ABNORMAL HIGH (ref 0.44–1.00)
GFR calc Af Amer: 9 mL/min — ABNORMAL LOW (ref >60)
GFR calc non Af Amer: 8 mL/min — ABNORMAL LOW (ref >60)
Glucose, Bld: 226 mg/dL — ABNORMAL HIGH (ref 70–99)
Potassium: 4.6 mmol/L (ref 3.5–5.1)
Sodium: 137 mmol/L (ref 135–145)
Total Bilirubin: 1.4 mg/dL — ABNORMAL HIGH (ref 0.3–1.2)
Total Protein: 7.3 g/dL (ref 6.5–8.1)

## 2018-12-01 LAB — GLUCOSE, CAPILLARY
Glucose-Capillary: 160 mg/dL — ABNORMAL HIGH (ref 70–99)
Glucose-Capillary: 158 mg/dL — ABNORMAL HIGH (ref 70–99)

## 2018-12-01 LAB — BRAIN NATRIURETIC PEPTIDE: B Natriuretic Peptide: 37 pg/mL (ref 0.0–100.0)

## 2018-12-01 MED ORDER — ALTEPLASE 2 MG IJ SOLR: 2.0000 mg | Freq: Once | INTRAMUSCULAR | Status: DC | PRN

## 2018-12-01 MED ORDER — SODIUM CHLORIDE 0.9 % IV SOLN: 100.0000 mL | INTRAVENOUS | Status: DC | PRN

## 2018-12-01 MED ORDER — FLUOXETINE HCL 20 MG PO CAPS
20.0000 mg | ORAL_CAPSULE | Freq: Every day | ORAL | Status: DC
  Administered 2018-12-02 – 2018-12-12 (×9): 20 mg via ORAL
  Filled 2018-12-01 (×9): qty 1

## 2018-12-01 MED ORDER — ACETAMINOPHEN 500 MG PO TABS: 1000.0000 mg | ORAL_TABLET | Freq: Four times a day (QID) | ORAL | Status: DC | PRN

## 2018-12-01 MED ORDER — DORZOLAMIDE HCL 2 % OP SOLN
1.0000 [drp] | Freq: Two times a day (BID) | OPHTHALMIC | Status: DC
  Administered 2018-12-01 – 2018-12-12 (×20): 1 [drp] via OPHTHALMIC
  Filled 2018-12-01 (×2): qty 10

## 2018-12-01 MED ORDER — INSULIN DETEMIR 100 UNIT/ML ~~LOC~~ SOLN
15.0000 [IU] | Freq: Every day | SUBCUTANEOUS | Status: DC
  Administered 2018-12-02: 15 [IU] via SUBCUTANEOUS
  Filled 2018-12-01 (×2): qty 0.15

## 2018-12-01 MED ORDER — CHLORHEXIDINE GLUCONATE CLOTH 2 % EX PADS
6.0000 | MEDICATED_PAD | Freq: Every day | CUTANEOUS | Status: DC
  Administered 2018-12-04 – 2018-12-05 (×2): 6 via TOPICAL

## 2018-12-01 MED ORDER — NITROGLYCERIN 0.4 MG SL SUBL: 0.4000 mg | SUBLINGUAL_TABLET | SUBLINGUAL | Status: DC | PRN

## 2018-12-01 MED ORDER — HEPARIN SODIUM (PORCINE) 5000 UNIT/ML IJ SOLN
5000.0000 [IU] | Freq: Three times a day (TID) | INTRAMUSCULAR | Status: DC
  Administered 2018-12-01 – 2018-12-12 (×28): 5000 [IU] via SUBCUTANEOUS
  Filled 2018-12-01 (×28): qty 1

## 2018-12-01 MED ORDER — ALBUTEROL SULFATE (2.5 MG/3ML) 0.083% IN NEBU: 3.0000 mL | INHALATION_SOLUTION | Freq: Four times a day (QID) | RESPIRATORY_TRACT | Status: DC | PRN

## 2018-12-01 MED ORDER — LIRAGLUTIDE 18 MG/3ML ~~LOC~~ SOPN: 1.8000 mg | PEN_INJECTOR | Freq: Every evening | SUBCUTANEOUS | Status: DC

## 2018-12-01 MED ORDER — VITAMIN D 25 MCG (1000 UNIT) PO TABS
5000.0000 [IU] | ORAL_TABLET | Freq: Every day | ORAL | Status: DC
  Administered 2018-12-02 – 2018-12-12 (×9): 5000 [IU] via ORAL
  Filled 2018-12-01 (×9): qty 5

## 2018-12-01 MED ORDER — VITAMIN B-12 1000 MCG PO TABS
1000.0000 ug | ORAL_TABLET | Freq: Every day | ORAL | Status: DC
  Administered 2018-12-02 – 2018-12-12 (×9): 1000 ug via ORAL
  Filled 2018-12-01 (×9): qty 1

## 2018-12-01 MED ORDER — AMLODIPINE BESYLATE 10 MG PO TABS: 10.0000 mg | ORAL_TABLET | Freq: Every day | ORAL | Status: DC

## 2018-12-01 MED ORDER — LIDOCAINE-PRILOCAINE 2.5-2.5 % EX CREA: 1.0000 "application " | TOPICAL_CREAM | CUTANEOUS | Status: DC | PRN

## 2018-12-01 MED ORDER — PENTAFLUOROPROP-TETRAFLUOROETH EX AERO: 1.0000 "application " | INHALATION_SPRAY | CUTANEOUS | Status: DC | PRN

## 2018-12-01 MED ORDER — CARVEDILOL 12.5 MG PO TABS
12.5000 mg | ORAL_TABLET | Freq: Two times a day (BID) | ORAL | Status: DC
  Administered 2018-12-02 – 2018-12-03 (×3): 12.5 mg via ORAL
  Filled 2018-12-01 (×3): qty 1

## 2018-12-01 MED ORDER — ROSUVASTATIN CALCIUM 20 MG PO TABS
40.0000 mg | ORAL_TABLET | Freq: Every day | ORAL | Status: DC
  Administered 2018-12-01 – 2018-12-11 (×11): 40 mg via ORAL
  Filled 2018-12-01 (×11): qty 2

## 2018-12-01 MED ORDER — FUROSEMIDE 80 MG PO TABS: 160.0000 mg | ORAL_TABLET | Freq: Two times a day (BID) | ORAL | Status: DC

## 2018-12-01 MED ORDER — MORPHINE SULFATE (PF) 2 MG/ML IV SOLN: 2.0000 mg | INTRAVENOUS | Status: DC | PRN

## 2018-12-01 MED ORDER — HEPARIN SODIUM (PORCINE) 1000 UNIT/ML DIALYSIS: 1000.0000 [IU] | INTRAMUSCULAR | Status: DC | PRN

## 2018-12-01 MED ORDER — INSULIN ASPART 100 UNIT/ML ~~LOC~~ SOLN
0.0000 [IU] | Freq: Every day | SUBCUTANEOUS | Status: DC
  Administered 2018-12-02 – 2018-12-05 (×2): 3 [IU] via SUBCUTANEOUS
  Administered 2018-12-08: 4 [IU] via SUBCUTANEOUS
  Administered 2018-12-09 – 2018-12-11 (×2): 3 [IU] via SUBCUTANEOUS

## 2018-12-01 MED ORDER — LATANOPROST 0.005 % OP SOLN
1.0000 [drp] | Freq: Every day | OPHTHALMIC | Status: DC
  Administered 2018-12-01 – 2018-12-11 (×11): 1 [drp] via OPHTHALMIC
  Filled 2018-12-01 (×2): qty 2.5

## 2018-12-01 MED ORDER — HEPARIN SODIUM (PORCINE) 1000 UNIT/ML DIALYSIS: 20.0000 [IU]/kg | INTRAMUSCULAR | Status: DC | PRN

## 2018-12-01 MED ORDER — PANTOPRAZOLE SODIUM 40 MG PO TBEC
40.0000 mg | DELAYED_RELEASE_TABLET | Freq: Every day | ORAL | Status: DC
  Administered 2018-12-02 – 2018-12-12 (×9): 40 mg via ORAL
  Filled 2018-12-01 (×9): qty 1

## 2018-12-01 MED ORDER — GABAPENTIN 300 MG PO CAPS
300.0000 mg | ORAL_CAPSULE | Freq: Every day | ORAL | Status: DC
  Administered 2018-12-02 – 2018-12-09 (×6): 300 mg via ORAL
  Filled 2018-12-01 (×6): qty 1

## 2018-12-01 MED ORDER — INSULIN ASPART 100 UNIT/ML ~~LOC~~ SOLN
0.0000 [IU] | Freq: Three times a day (TID) | SUBCUTANEOUS | Status: DC
  Administered 2018-12-02: 2 [IU] via SUBCUTANEOUS
  Administered 2018-12-02 (×2): 3 [IU] via SUBCUTANEOUS
  Administered 2018-12-03 – 2018-12-04 (×3): 2 [IU] via SUBCUTANEOUS
  Administered 2018-12-05: 3 [IU] via SUBCUTANEOUS
  Administered 2018-12-05: 5 [IU] via SUBCUTANEOUS
  Administered 2018-12-05 – 2018-12-07 (×4): 3 [IU] via SUBCUTANEOUS
  Administered 2018-12-07: 1 [IU] via SUBCUTANEOUS
  Administered 2018-12-07: 3 [IU] via SUBCUTANEOUS
  Administered 2018-12-08 – 2018-12-09 (×2): 1 [IU] via SUBCUTANEOUS
  Administered 2018-12-09: 3 [IU] via SUBCUTANEOUS
  Administered 2018-12-09 – 2018-12-10 (×3): 2 [IU] via SUBCUTANEOUS
  Administered 2018-12-10: 5 [IU] via SUBCUTANEOUS
  Administered 2018-12-11 (×2): 2 [IU] via SUBCUTANEOUS
  Administered 2018-12-12: 1 [IU] via SUBCUTANEOUS

## 2018-12-01 MED ORDER — LIDOCAINE HCL (PF) 1 % IJ SOLN: 5.0000 mL | INTRAMUSCULAR | Status: DC | PRN

## 2018-12-01 MED ORDER — NITROGLYCERIN 0.4 MG SL SUBL
SUBLINGUAL_TABLET | SUBLINGUAL | Status: AC
  Administered 2018-12-01: 0.4 mg
  Filled 2018-12-01: qty 1

## 2018-12-01 MED ORDER — ASPIRIN EC 81 MG PO TBEC
81.0000 mg | DELAYED_RELEASE_TABLET | Freq: Every day | ORAL | Status: DC
  Administered 2018-12-02 – 2018-12-12 (×9): 81 mg via ORAL
  Filled 2018-12-01 (×9): qty 1

## 2018-12-01 NOTE — Procedures (Signed)
   I was present at this dialysis session, have reviewed the session itself and made  appropriate changes Kelly Splinter MD [place] pager [phone]   12/01/2018, 7:27 PM

## 2018-12-01 NOTE — Progress Notes (Signed)
Patient c/o SOB and tightness to mid chest/underneath left breast. O2 sats 100 % RA. On call Bodenheimer, NP made aware. Order received for [employer] and morphine.

## 2018-12-01 NOTE — Consult Note (Addendum)
[facility] Renal Consultation Note    Indication for Consultation:  Management of ESRD/hemodialysis; anemia, hypertension/volume and secondary hyperparathyroidism  HPI: Priscilla Davis is a 68 y.o. female with ESRD on HD TTS at [facility]. Recently started HD, 1st treatment 11/15/18. ESRD 2/2 long standing DM Type 2. PMH also significant for HTN, CHF EF 35-40%, polyneuropathy, GERD, anxiety.  Presented to ED this am with worsening chest pain/SOB with exertion. No acute changes on EKG, Tropnin neg. CXR clear. Labs Na 137 , K 4.6,  Glucose 226, BUN 21, Cr 5.35, Ca 9.3, Hgb 10.6. Seen by cardiology in ED and planning for cardiac catheterization on Monday.   Asked to see for routine dialysis due today. Last HD Thursday. She completed a full treatment and left 0.4kg below her dry weight. BPs have been well controlled on HD. Post HD Bps 90s-100s/60s.   Seen at bedside. No respiratory distress. O2 sats 100% on RA.   Endorses 1 week of fatigue, chest tightness, SOB with exertion. Worse this am when she was getting ready for dialysis. No sick contacts, no fevers, chills. Poor appetite and nausea for last month. No UOP in the last 2 days.    Past Medical History:  Diagnosis Date  . Arthritis   . Asthma   . Depression   . Diabetes mellitus without complication ([place])    Type II  . GERD (gastroesophageal reflux disease)   . Hypertension   . Renal disorder    CKD 5   Past Surgical History:  Procedure Laterality Date  . AV FISTULA PLACEMENT Left 06/19/2018   Procedure: INSERTION OF 4-7MM X 45CM ARTERIOVENOUS (AV) GORE-TEX GRAFT LEFT  ARM;  Surgeon: Elam Dutch, MD;  Location: [place];  Service: Vascular;  Laterality: Left;  . CESAREAN SECTION    . CHOLECYSTECTOMY    . COLONOSCOPY    . EYE SURGERY Bilateral    diabetic   . toe amputation Right    5th toe   Family History  Problem Relation Age of Onset  . Peripheral vascular disease Mother   . Hypertension Mother    Social  History:  reports that she quit smoking about 2 months ago. Her smoking use included cigarettes. She has a 7.50 pack-year smoking history. She has never used smokeless tobacco. She reports current alcohol use. She reports that she does not use drugs. No Known Allergies   Prior to Admission medications   Medication Sig Start Date End Date Taking? Authorizing Provider  Acetaminophen 500 MG coapsule Take 1,000 mg by mouth every 6 (six) hours as needed for mild pain or moderate pain.     [provider]  albuterol (PROVENTIL HFA;VENTOLIN HFA) 108 (90 Base) MCG/ACT inhaler Inhale 2 puffs into the lungs every 6 (six) hours as needed for wheezing or shortness of breath.    [provider]  amLODipine (NORVASC) 10 MG tablet Take 10 mg by mouth daily. AT BEDTIME    [provider]  aspirin EC 81 MG tablet Take 81 mg by mouth daily.    [provider]  carvedilol (COREG) 12.5 MG tablet Take 1 tablet (12.5 mg total) by mouth 2 (two) times daily with a meal. 09/12/18   Ghimire, Henreitta Leber, MD  Cholecalciferol (VITAMIN D) 125 MCG (5000 UT) CAPS Take 5,000 Units by mouth daily.     [provider]  dorzolamide (TRUSOPT) 2 % ophthalmic solution Place 1 drop into both eyes 2 (two) times daily.    [provider]  FLUoxetine (PROZAC) 20 MG capsule Take 20 mg by mouth daily. 05/22/17   [provider]  furosemide (LASIX) 80 MG tablet Take 2 tablets (160 mg total) by mouth 2 (two) times daily. 09/12/18   Ghimire, Henreitta Leber, MD  gabapentin (NEURONTIN) 300 MG capsule Take 1 capsule (300 mg total) by mouth daily. 09/12/18   Ghimire, Henreitta Leber, MD  insulin detemir (LEVEMIR) 100 UNIT/ML injection Inject 0.15 mLs (15 Units total) into the skin daily. 09/12/18   Ghimire, Henreitta Leber, MD  latanoprost (XALATAN) 0.005 % ophthalmic solution Place 1 drop into both eyes at bedtime.    [provider]  liraglutide (VICTOZA) 18 MG/3ML SOPN Inject 1.8 mg into the skin  every evening.     [provider]  Multiple Vitamin (MULTIVITAMIN WITH MINERALS) TABS tablet Take 1 tablet by mouth daily. woman's    [provider]  omeprazole (PRILOSEC) 20 MG capsule Take 40 mg by mouth daily.     [provider]  Rosuvastatin Calcium 40 MG CPSP Take 40 mg by mouth at bedtime.     [provider]  vitamin B-12 (CYANOCOBALAMIN) 1000 MCG tablet Take 1,000 mcg by mouth daily.    [provider]   Current Facility-Administered Medications  Medication Dose Route Frequency Provider Last Rate Last Dose  . insulin aspart (novoLOG) injection 0-5 Units  0-5 Units Subcutaneous QHS Ghimire, Kuber, MD      . insulin aspart (novoLOG) injection 0-9 Units  0-9 Units Subcutaneous TID WC Barb Merino, MD       Current Outpatient Medications  Medication Sig Dispense Refill  . Acetaminophen 500 MG coapsule Take 1,000 mg by mouth every 6 (six) hours as needed for mild pain or moderate pain.     Marland Kitchen albuterol (PROVENTIL HFA;VENTOLIN HFA) 108 (90 Base) MCG/ACT inhaler Inhale 2 puffs into the lungs every 6 (six) hours as needed for wheezing or shortness of breath.    Marland Kitchen amLODipine (NORVASC) 10 MG tablet Take 10 mg by mouth daily. AT BEDTIME    . aspirin EC 81 MG tablet Take 81 mg by mouth daily.    . carvedilol (COREG) 12.5 MG tablet Take 1 tablet (12.5 mg total) by mouth 2 (two) times daily with a meal. 60 tablet 0  . Cholecalciferol (VITAMIN D) 125 MCG (5000 UT) CAPS Take 5,000 Units by mouth daily.     . dorzolamide (TRUSOPT) 2 % ophthalmic solution Place 1 drop into both eyes 2 (two) times daily.    Marland Kitchen FLUoxetine (PROZAC) 20 MG capsule Take 20 mg by mouth daily.    . furosemide (LASIX) 80 MG tablet Take 2 tablets (160 mg total) by mouth 2 (two) times daily. 120 tablet 0  . gabapentin (NEURONTIN) 300 MG capsule Take 1 capsule (300 mg total) by mouth daily. 30 capsule 0  . insulin detemir (LEVEMIR) 100 UNIT/ML injection Inject 0.15 mLs (15 Units  total) into the skin daily. 10 mL 0  . latanoprost (XALATAN) 0.005 % ophthalmic solution Place 1 drop into both eyes at bedtime.    . liraglutide (VICTOZA) 18 MG/3ML SOPN Inject 1.8 mg into the skin every evening.     . Multiple Vitamin (MULTIVITAMIN WITH MINERALS) TABS tablet Take 1 tablet by mouth daily. woman's    . omeprazole (PRILOSEC) 20 MG capsule Take 40 mg by mouth daily.     . Rosuvastatin Calcium 40 MG CPSP Take 40 mg by mouth at bedtime.     . vitamin B-12 (CYANOCOBALAMIN) 1000  MCG tablet Take 1,000 mcg by mouth daily.       ROS: As per HPI otherwise negative.  Physical Exam: Vitals:   12/01/18 1021 12/01/18 1024 12/01/18 1025 12/01/18 1026  BP: (!) 146/64 (!) 146/64    Pulse: 86  87   Resp: 17  13   Temp: 98.6 F (37 C)     TempSrc: Oral     SpO2: 99%  100%   Weight:    93 kg  Height:    5\' 9"  (1.753 m)     General: WDWN elderly female NAD  Head: NCAT sclera not icteric MMM Neck: Supple. No JVD No masses Lungs: CTA bilaterally without wheezes, rales, or rhonchi. Breathing is unlabored. Heart: RRR with S1 S2 Abdomen: soft NT + BS Lower extremities:without edema or ischemic changes, no open wounds  Neuro: A & O  X 3. Moves all extremities spontaneously. Psych:  Responds to questions appropriately with a normal affect. Dialysis Access: L forearm AVG +bruit   Labs: Basic Metabolic Panel: Recent Labs  Lab 12/01/18 1027  NA 137  K 4.6  CL 98  CO2 21*  GLUCOSE 226*  BUN 21  CREATININE 5.35*  CALCIUM 9.3   Liver Function Tests: Recent Labs  Lab 12/01/18 1027  AST 37  ALT 26  ALKPHOS 63  BILITOT 1.4*  PROT 7.3  ALBUMIN 3.5   No results for input(s): LIPASE, AMYLASE in the last 168 hours. No results for input(s): AMMONIA in the last 168 hours. CBC: Recent Labs  Lab 12/01/18 1027  WBC 11.5*  NEUTROABS 9.1*  HGB 10.6*  HCT 34.6*  MCV 90.8  PLT 289   Cardiac Enzymes: Recent Labs  Lab 12/01/18 1027  TROPONINI <0.03   CBG: No results for  input(s): GLUCAP in the last 168 hours. Iron Studies: No results for input(s): IRON, TIBC, TRANSFERRIN, FERRITIN in the last 72 hours. Studies/Results: Dg Chest 2 View  Result Date: 12/01/2018 CLINICAL DATA:  Shortness of breath and weakness for 4 days. Recent pneumonia. EXAM: CHEST - 2 VIEW COMPARISON:  09/09/2018 FINDINGS: The heart size and mediastinal contours are within normal limits. There has been resolution of bilateral pulmonary airspace disease since previous study. Both lungs are clear. No evidence of pleural effusion. Mid and lower thoracic degenerative disc disease noted. IMPRESSION: No active cardiopulmonary disease. Electronically Signed   By: Earle Gell M.D.   On: 12/01/2018 10:59    Dialysis Orders:  [facility] TTS 4h 180NRe 400/1.5x EDW 91kg 3K/2.25 Ca  L AVG  Hep 2000 Bolus Venofer 50mg  IV q week  Mircera 47mcg IV q 2 weeks last 3/31   Assessment/Plan: 1. Chest pain/DOE -hx CAD/cardiomyopathy. Cardiology following -plans for cardiac cath Monday 2. ESRD -  New HD start. 1st HD 11/15/18. HD TTS. Plan for HD today on schedule  3. Hypertension/volume  -BP controlled. CXR clear. No volume excess on exam.  Home meds amlodipine 10, carvedilol 12.5 bid, furosemide 160 bid? Can likely titrate down  Unable to pull much fluid today on HD , BP's dropped early into 70's and UF turned off. Also BP's soft, have dc'd norvasc, cont low dose coreg w/ hold orders.  4. Anemia  - Hgb 10.6. On ESA as outpatient  5. Metabolic bone disease -  Ca ok. No VDRA or binders yet  6. Nutrition - Renal diet/vitamins  7. DM - insulin per primary   Lynnda Child PA-C [place] Pager [phone] 12/01/2018, 1:26 PM   Pt seen,  examined and agree w assess/plan as above with additions as indicated.  [facility] 12/01/2018, 7:26 PM

## 2018-12-01 NOTE — ED Provider Notes (Signed)
[place] EMERGENCY DEPARTMENT Provider Note   CSN: [id number] Arrival date & time: 12/01/18  1017    History   Chief Complaint Chief Complaint  Patient presents with  . Shortness of Breath  . Weakness    HPI Sharon Rubis is a 68 y.o. female.     HPI   68 yo F with PMhx as below including recent admission for CA-PNA here with DOE. Pt reports that over the last 24 hours or so, she's had worsening SOB with exertion. She admits to poor appetite, general fatigue x 3-4 days. Today, however, she's noticed severe SOB with exertion. It is worse w/ even trying ot walk across the room, which is new for her. Feels similar to her recent admission for PNA. Denies any known fevers. No known sick contacts but did recently start iHD over hte past few weeks. No sputum production or cough. She has some associated mild chest pressure w/ exertion. No Cp at rest. Nothing helps other than rest.    Past Medical History:  Diagnosis Date  . Arthritis   . Asthma   . Depression   . Diabetes mellitus without complication ([place])    Type II  . GERD (gastroesophageal reflux disease)   . Hypertension   . Renal disorder    CKD 5    Patient Active Problem List   Diagnosis Date Noted  . Acute combined systolic and diastolic heart failure ([place]) 09/24/2018  . Cardiomyopathy ([place]) 09/24/2018  . Demand ischemia ([place])   . Essential hypertension   . Bilateral pneumonia 09/07/2018  . Mild intermittent asthma 09/07/2018  . Insulin dependent diabetes mellitus with complications ([place]) 40/97/3532  . Smoker 09/07/2018  . Pneumonia 09/07/2018  . Chronic kidney disease (CKD), stage IV (severe) ([place]) 07/12/2018    Past Surgical History:  Procedure Laterality Date  . AV FISTULA PLACEMENT Left 06/19/2018   Procedure: INSERTION OF 4-7MM X 45CM ARTERIOVENOUS (AV) GORE-TEX GRAFT LEFT  ARM;  Surgeon: Elam Dutch, MD;  Location: [place];  Service: Vascular;  Laterality: Left;  . CESAREAN SECTION     . CHOLECYSTECTOMY    . COLONOSCOPY    . EYE SURGERY Bilateral    diabetic   . toe amputation Right    5th toe     OB History   No obstetric history on file.      Home Medications    Prior to Admission medications   Medication Sig Start Date End Date Taking? Authorizing Provider  Acetaminophen 500 MG coapsule Take 1,000 mg by mouth every 6 (six) hours as needed for mild pain or moderate pain.     [provider]  albuterol (PROVENTIL HFA;VENTOLIN HFA) 108 (90 Base) MCG/ACT inhaler Inhale 2 puffs into the lungs every 6 (six) hours as needed for wheezing or shortness of breath.    [provider]  amLODipine (NORVASC) 10 MG tablet Take 10 mg by mouth daily. AT BEDTIME    [provider]  aspirin EC 81 MG tablet Take 81 mg by mouth daily.    [provider]  carvedilol (COREG) 12.5 MG tablet Take 1 tablet (12.5 mg total) by mouth 2 (two) times daily with a meal. 09/12/18   Ghimire, Henreitta Leber, MD  Cholecalciferol (VITAMIN D) 125 MCG (5000 UT) CAPS Take 5,000 Units by mouth daily.     [provider]  dorzolamide (TRUSOPT) 2 % ophthalmic solution Place 1 drop into both eyes 2 (two) times daily.    [provider]  FLUoxetine (PROZAC) 20 MG capsule Take 20 mg by mouth daily. 05/22/17   [provider]  furosemide (LASIX) 80 MG tablet Take 2 tablets (160 mg total) by mouth 2 (two) times daily. 09/12/18   Ghimire, Henreitta Leber, MD  gabapentin (NEURONTIN) 300 MG capsule Take 1 capsule (300 mg total) by mouth daily. 09/12/18   Ghimire, Henreitta Leber, MD  insulin detemir (LEVEMIR) 100 UNIT/ML injection Inject 0.15 mLs (15 Units total) into the skin daily. 09/12/18   Ghimire, Henreitta Leber, MD  latanoprost (XALATAN) 0.005 % ophthalmic solution Place 1 drop into both eyes at bedtime.    [provider]  liraglutide (VICTOZA) 18 MG/3ML SOPN Inject 1.8 mg into the skin every evening.     [provider]  Multiple Vitamin  (MULTIVITAMIN WITH MINERALS) TABS tablet Take 1 tablet by mouth daily. woman's    [provider]  omeprazole (PRILOSEC) 20 MG capsule Take 40 mg by mouth daily.     [provider]  Rosuvastatin Calcium 40 MG CPSP Take 40 mg by mouth at bedtime.     [provider]  vitamin B-12 (CYANOCOBALAMIN) 1000 MCG tablet Take 1,000 mcg by mouth daily.    [provider]    Family History Family History  Problem Relation Age of Onset  . Peripheral vascular disease Mother   . Hypertension Mother     Social History Social History   Tobacco Use  . Smoking status: Former Smoker    Packs/day: 0.25    Years: 30.00    Pack years: 7.50    Types: Cigarettes    Last attempt to quit: 09/17/2018    Years since quitting: 0.2  . Smokeless tobacco: Never Used  Substance Use Topics  . Alcohol use: Yes    Comment: seldom  . Drug use: Never     Allergies   Patient has no known allergies.   Review of Systems Review of Systems  Constitutional: Positive for appetite change and fatigue. Negative for chills and fever.  HENT: Negative for congestion and rhinorrhea.   Eyes: Negative for visual disturbance.  Respiratory: Positive for shortness of breath. Negative for cough and wheezing.   Cardiovascular: Negative for chest pain and leg swelling.  Gastrointestinal: Negative for abdominal pain, diarrhea, nausea and vomiting.  Genitourinary: Negative for dysuria and flank pain.  Musculoskeletal: Negative for neck pain and neck stiffness.  Skin: Negative for rash and wound.  Allergic/Immunologic: Negative for immunocompromised state.  Neurological: Positive for weakness. Negative for syncope and headaches.  All other systems reviewed and are negative.    Physical Exam Updated Vital Signs BP (!) 146/64   Pulse 87   Temp 98.6 F (37 C) (Oral)   Resp 13   Ht 5\' 9"  (1.753 m)   Wt 93 kg   SpO2 100%   BMI 30.28 kg/m   Physical Exam Vitals signs and nursing note  reviewed.  Constitutional:      General: She is not in acute distress.    Appearance: She is well-developed.  HENT:     Head: Normocephalic and atraumatic.  Eyes:     Conjunctiva/sclera: Conjunctivae normal.  Neck:     Musculoskeletal: Neck supple.  Cardiovascular:     Rate and Rhythm: Normal rate and regular rhythm.     Heart sounds: Normal heart sounds. No murmur. No friction rub.  Pulmonary:     Effort: Pulmonary effort is normal. No respiratory distress.     Breath sounds: Examination  of the right-lower field reveals rales. Examination of the left-lower field reveals rales. Rales present. No wheezing.  Abdominal:     General: There is no distension.     Palpations: Abdomen is soft.     Tenderness: There is no abdominal tenderness.  Musculoskeletal:     Right lower leg: No edema.     Left lower leg: No edema.     Comments: L AVF site c/d/i  Skin:    General: Skin is warm.     Capillary Refill: Capillary refill takes less than 2 seconds.  Neurological:     Mental Status: She is alert and oriented to person, place, and time.     Motor: No abnormal muscle tone.      ED Treatments / Results  Labs (all labs ordered are listed, but only abnormal results are displayed) Labs Reviewed  CBC WITH DIFFERENTIAL/PLATELET - Abnormal; Notable for the following components:      Result Value   WBC 11.5 (*)    RBC 3.81 (*)    Hemoglobin 10.6 (*)    HCT 34.6 (*)    Neutro Abs 9.1 (*)    All other components within normal limits  COMPREHENSIVE METABOLIC PANEL - Abnormal; Notable for the following components:   CO2 21 (*)    Glucose, Bld 226 (*)    Creatinine, Ser 5.35 (*)    Total Bilirubin 1.4 (*)    GFR calc non Af Amer 8 (*)    GFR calc Af Amer 9 (*)    Anion gap 18 (*)    All other components within normal limits  BRAIN NATRIURETIC PEPTIDE  TROPONIN I    EKG EKG Interpretation  Date/Time:  Saturday December 01 2018 10:25:13 EDT Ventricular Rate:  88 PR Interval:    QRS  Duration: 109 QT Interval:  406 QTC Calculation: 492 R Axis:   53 Text Interpretation:  Sinus rhythm Borderline low voltage, extremity leads Borderline prolonged QT interval No significant change since last tracing Confirmed by Duffy Bruce [id number]) on 12/01/2018 10:32:15 AM   Radiology Dg Chest 2 View  Result Date: 12/01/2018 CLINICAL DATA:  Shortness of breath and weakness for 4 days. Recent pneumonia. EXAM: CHEST - 2 VIEW COMPARISON:  09/09/2018 FINDINGS: The heart size and mediastinal contours are within normal limits. There has been resolution of bilateral pulmonary airspace disease since previous study. Both lungs are clear. No evidence of pleural effusion. Mid and lower thoracic degenerative disc disease noted. IMPRESSION: No active cardiopulmonary disease. Electronically Signed   By: Earle Gell M.D.   On: 12/01/2018 10:59    Procedures Procedures (including critical care time)  Medications Ordered in ED Medications - No data to display   Initial Impression / Assessment and Plan / ED Course  I have reviewed the triage vital signs and the nursing notes.  Pertinent labs & imaging results that were available during my care of the patient were reviewed by me and considered in my medical decision making (see chart for details).  Clinical Course as of Dec 01 1302  Sat Dec 01, 2018  1205 68 yo F with PMHx CKD, recent NSTEMI here w/ worsening DOE, chest pressure. Plan for outpt cath w/ Cards per records. EKG today non-ischemic, trop neg. CBC, CMP is @ baseline. BNP normal. CXR is now clear. Doubt recurrent PNA or infectious process, suspect this is 2/2 her underlying CHF, question of underlying CAD. Will d/w Cardiology.   [CI]    Clinical Course User Index [  CI] Duffy Bruce, MD       68 yo F here with CP, SOB. Discussed with Cards - likely plan to cath on Monday for unstable angina. Given ESRD status, they recommend admit to medicine. Will d/w Nephrology as pt is due today for  HD.  Final Clinical Impressions(s) / ED Diagnoses   Final diagnoses:  ESRD (end stage renal disease) ([place])  Chest pain, unspecified type    ED Discharge Orders    None       Duffy Bruce, MD 12/01/18 1304

## 2018-12-01 NOTE — H&P (Signed)
History and Physical    Priscilla Davis OXB:[id number] DOB: 1950-12-01 DOA: 12/01/2018  PCP: Kristen Loader, FNP  Patient coming from: home   I have personally briefly reviewed patient's old medical records available.   Chief Complaint: chest discomfort , shortness ob breath   HPI: Priscilla Davis is a 68 y.o. female with medical history significant of ESRD recently started on hemodialysis through left AV fistula, cardiomyopathy, abnormal nuclear stress test, EF of 40%, hypertension and diabetes , recently quit a smoker who presents to the emergency room with worsening shortness of breath and chest discomfort for 3 weeks.  Patient had recent hospitalization, abnormal nuclear scan test, was seen outpatient and scheduled for cardiac cath.  Previously they were trying to avoid cardiac cath because of worsening renal functions.  She is recently started on hemodialysis, TTS schedule.  Since last 2 to 3 weeks, patient feels fatigued all the time, weak, short of breath on getting up and walking around. No cough or cold.  No definite chest pain. No flulike symptoms. ED Course: Hemodynamically stable.  On room air.  Troponins nonischemic.  No chest pain or shortness of breath at rest.  Twelve-lead EKG nonischemic.  Mostly old changes. Case discussed with cardiology and nephrology.  Cardiology planning for cardiac cath due to persistent symptoms.  Review of Systems: As per HPI otherwise 10 point review of systems negative.    Past Medical History:  Diagnosis Date  . Arthritis   . Asthma   . Depression   . Diabetes mellitus without complication ([place])    Type II  . GERD (gastroesophageal reflux disease)   . Hypertension   . Renal disorder    CKD 5    Past Surgical History:  Procedure Laterality Date  . AV FISTULA PLACEMENT Left 06/19/2018   Procedure: INSERTION OF 4-7MM X 45CM ARTERIOVENOUS (AV) GORE-TEX GRAFT LEFT  ARM;  Surgeon: Elam Dutch, MD;  Location: [facility];  Service: Vascular;   Laterality: Left;  . CESAREAN SECTION    . CHOLECYSTECTOMY    . COLONOSCOPY    . EYE SURGERY Bilateral    diabetic   . toe amputation Right    5th toe     reports that she quit smoking about 2 months ago. Her smoking use included cigarettes. She has a 7.50 pack-year smoking history. She has never used smokeless tobacco. She reports current alcohol use. She reports that she does not use drugs.  No Known Allergies  Family History  Problem Relation Age of Onset  . Peripheral vascular disease Mother   . Hypertension Mother      Prior to Admission medications   Medication Sig Start Date End Date Taking? Authorizing Provider  Acetaminophen 500 MG coapsule Take 1,000 mg by mouth every 6 (six) hours as needed for mild pain or moderate pain.     [provider]  albuterol (PROVENTIL HFA;VENTOLIN HFA) 108 (90 Base) MCG/ACT inhaler Inhale 2 puffs into the lungs every 6 (six) hours as needed for wheezing or shortness of breath.    [provider]  amLODipine (NORVASC) 10 MG tablet Take 10 mg by mouth daily. AT BEDTIME    [provider]  aspirin EC 81 MG tablet Take 81 mg by mouth daily.    [provider]  carvedilol (COREG) 12.5 MG tablet Take 1 tablet (12.5 mg total) by mouth 2 (two) times daily with a meal. 09/12/18   Shelbia Scinto, Henreitta Leber, MD  Cholecalciferol (VITAMIN D) 125 MCG (5000 UT)  CAPS Take 5,000 Units by mouth daily.     [provider]  dorzolamide (TRUSOPT) 2 % ophthalmic solution Place 1 drop into both eyes 2 (two) times daily.    [provider]  FLUoxetine (PROZAC) 20 MG capsule Take 20 mg by mouth daily. 05/22/17   [provider]  furosemide (LASIX) 80 MG tablet Take 2 tablets (160 mg total) by mouth 2 (two) times daily. 09/12/18   Marshayla Mitschke, Henreitta Leber, MD  gabapentin (NEURONTIN) 300 MG capsule Take 1 capsule (300 mg total) by mouth daily. 09/12/18   Kymberlie Brazeau, Henreitta Leber, MD  insulin detemir (LEVEMIR) 100 UNIT/ML injection  Inject 0.15 mLs (15 Units total) into the skin daily. 09/12/18   Myron Stankovich, Henreitta Leber, MD  latanoprost (XALATAN) 0.005 % ophthalmic solution Place 1 drop into both eyes at bedtime.    [provider]  liraglutide (VICTOZA) 18 MG/3ML SOPN Inject 1.8 mg into the skin every evening.     [provider]  Multiple Vitamin (MULTIVITAMIN WITH MINERALS) TABS tablet Take 1 tablet by mouth daily. woman's    [provider]  omeprazole (PRILOSEC) 20 MG capsule Take 40 mg by mouth daily.     [provider]  Rosuvastatin Calcium 40 MG CPSP Take 40 mg by mouth at bedtime.     [provider]  vitamin B-12 (CYANOCOBALAMIN) 1000 MCG tablet Take 1,000 mcg by mouth daily.    [provider]    Physical Exam: Vitals:   12/01/18 1021 12/01/18 1024 12/01/18 1025 12/01/18 1026  BP: (!) 146/64 (!) 146/64    Pulse: 86  87   Resp: 17  13   Temp: 98.6 F (37 C)     TempSrc: Oral     SpO2: 99%  100%   Weight:    93 kg  Height:    5\' 9"  (1.753 m)    Constitutional: NAD, calm, comfortable Vitals:   12/01/18 1021 12/01/18 1024 12/01/18 1025 12/01/18 1026  BP: (!) 146/64 (!) 146/64    Pulse: 86  87   Resp: 17  13   Temp: 98.6 F (37 C)     TempSrc: Oral     SpO2: 99%  100%   Weight:    93 kg  Height:    5\' 9"  (1.753 m)   Eyes: PERRL, lids and conjunctivae normal ENMT: Mucous membranes are moist. Posterior pharynx clear of any exudate or lesions.Normal dentition.  Neck: normal, supple, no masses, no thyromegaly Respiratory: clear to auscultation bilaterally, no wheezing, no crackles. Normal respiratory effort. No accessory muscle use.  Cardiovascular: Regular rate and rhythm, no murmurs / rubs / gallops. No extremity edema. 2+ pedal pulses. No carotid bruits.  Abdomen: no tenderness, no masses palpated. No hepatosplenomegaly. Bowel sounds positive.  Musculoskeletal: no clubbing / cyanosis. No joint deformity upper and lower extremities. Good ROM, no  contractures. Normal muscle tone.  Skin: no rashes, lesions, ulcers. No induration.  Left upper extremity AV fistula with thrill. Neurologic: CN 2-12 grossly intact. Sensation intact, DTR normal. Strength 5/5 in all 4.  Psychiatric: Normal judgment and insight. Alert and oriented x 3. Normal mood.     Labs on Admission: I have personally reviewed following labs and imaging studies  CBC: Recent Labs  Lab 12/01/18 1027  WBC 11.5*  NEUTROABS 9.1*  HGB 10.6*  HCT 34.6*  MCV 90.8  PLT 863   Basic Metabolic Panel: Recent Labs  Lab 12/01/18 1027  NA 137  K 4.6  CL  98  CO2 21*  GLUCOSE 226*  BUN 21  CREATININE 5.35*  CALCIUM 9.3   GFR: Estimated Creatinine Clearance: 12 mL/min (A) (by C-G formula based on SCr of 5.35 mg/dL (H)). Liver Function Tests: Recent Labs  Lab 12/01/18 1027  AST 37  ALT 26  ALKPHOS 63  BILITOT 1.4*  PROT 7.3  ALBUMIN 3.5   No results for input(s): LIPASE, AMYLASE in the last 168 hours. No results for input(s): AMMONIA in the last 168 hours. Coagulation Profile: No results for input(s): INR, PROTIME in the last 168 hours. Cardiac Enzymes: Recent Labs  Lab 12/01/18 1027  TROPONINI <0.03   BNP (last 3 results) No results for input(s): PROBNP in the last 8760 hours. HbA1C: No results for input(s): HGBA1C in the last 72 hours. CBG: No results for input(s): GLUCAP in the last 168 hours. Lipid Profile: No results for input(s): CHOL, HDL, LDLCALC, TRIG, CHOLHDL, LDLDIRECT in the last 72 hours. Thyroid Function Tests: No results for input(s): TSH, T4TOTAL, FREET4, T3FREE, THYROIDAB in the last 72 hours. Anemia Panel: No results for input(s): VITAMINB12, FOLATE, FERRITIN, TIBC, IRON, RETICCTPCT in the last 72 hours. Urine analysis:    Component Value Date/Time   COLORURINE YELLOW 07/26/2017 1000   APPEARANCEUR HAZY (A) 07/26/2017 1000   LABSPEC 1.015 07/26/2017 1000   PHURINE 6.0 07/26/2017 1000   GLUCOSEU NEGATIVE 07/26/2017 1000    HGBUR NEGATIVE 07/26/2017 1000   BILIRUBINUR NEGATIVE 07/26/2017 1000   KETONESUR NEGATIVE 07/26/2017 1000   PROTEINUR 100 (A) 07/26/2017 1000   NITRITE NEGATIVE 07/26/2017 1000   LEUKOCYTESUR TRACE (A) 07/26/2017 1000    Radiological Exams on Admission: Dg Chest 2 View  Result Date: 12/01/2018 CLINICAL DATA:  Shortness of breath and weakness for 4 days. Recent pneumonia. EXAM: CHEST - 2 VIEW COMPARISON:  09/09/2018 FINDINGS: The heart size and mediastinal contours are within normal limits. There has been resolution of bilateral pulmonary airspace disease since previous study. Both lungs are clear. No evidence of pleural effusion. Mid and lower thoracic degenerative disc disease noted. IMPRESSION: No active cardiopulmonary disease. Electronically Signed   By: Earle Gell M.D.   On: 12/01/2018 10:59    EKG: Independently reviewed.  Sinus rhythm, low voltage EKG.  Right bundle branch block, no acute ST-T wave changes.  Shaky artifact.  Assessment/Plan Principal Problem:   SOB (shortness of breath) Active Problems:   Mild intermittent asthma   Essential hypertension   Cardiomyopathy ([place])   ESRD (end stage renal disease) ([facility])   Chest pain     1.  Chest tightness/progressive shortness of breath with abnormal stress test: No current chest pain.  Patient has persistent symptoms. Agree with admission to monitored unit. Cycle EKG and troponins. Supplemental oxygen to keep saturations more than 90%. Aspirin 81 mg daily continue. Nitroglycerin and Morphine for severe and recurrent pain.  Currently not indicated. Therapeutic anticoagulation not indicated because of no chest pain at rest Seen by cardiology.  Plan for cardiac cath Monday. Patient continue on beta-blockers, aspirin, statin.  2.  ESRD: May need more fluid removal.  Due for dialysis today.  Not in acute distress.  Nephrology notified for dialysis need.  3.  Hypertension: Currently fairly stable on Coreg and home medications.   4.  Hyperlipidemia: On statin to be continued.  5.  Type 2 diabetes: Well controlled.  Resume home medications including long-acting insulin and sliding scale insulin.    DVT prophylaxis: Heparin subcu Code Status: Full code Family Communication: None Disposition Plan:  Home, when is stable Consults called: Cardiology.  Nephrology. Admission status: Observation cardiac telemetry   Barb Merino MD [facility] Pager [phone]  If 7PM-7AM, please contact night-coverage [email or web address] Password [email or web address]  12/01/2018, 1:30 PM

## 2018-12-01 NOTE — ED Notes (Signed)
ED TO INPATIENT HANDOFF REPORT  ED Nurse Name and Phone #: Vilinda Blanks RN [phone]  S Name/Age/Gender Priscilla Davis 68 y.o. female Room/Bed: 034C/034C  Code Status   Code Status: Prior  Home/SNF/Other Home Patient oriented to: self, place, time and situation Is this baseline? Yes   Triage Complete: Triage complete  Chief Complaint SOB  Triage Note 3-4 days SOB, weakness. Recovered from PNA recently in Jan 2020. Pt dyspneic at rest. Afebrile 97.7. 157/74 CBG 234 L arm Rx for HD started 3 weeks ago. 70s SR, 12 L EKG WNL. Sats 100% on 2L No swelling noted on exam. Slight tenting. EDP ast BS.   Allergies No Known Allergies  Level of Care/Admitting Diagnosis ED Disposition    ED Disposition Condition Comment   Admit  Hospital Area: [facility] [[id number]]  Level of Care: Telemetry Cardiac [103]  I expect the patient will be discharged within 24 hours: No (not a candidate for 5C-Observation unit)  Diagnosis: Chest pain [332951]  Admitting Physician: Barb Merino [[id number]]  Attending Physician: Barb Merino [[id number]]  PT Class (Do Not Modify): Observation [104]  PT Acc Code (Do Not Modify): Observation [10022]       B Medical/Surgery History Past Medical History:  Diagnosis Date  . Arthritis   . Asthma   . Depression   . Diabetes mellitus without complication ([place])    Type II  . GERD (gastroesophageal reflux disease)   . Hypertension   . Renal disorder    CKD 5   Past Surgical History:  Procedure Laterality Date  . AV FISTULA PLACEMENT Left 06/19/2018   Procedure: INSERTION OF 4-7MM X 45CM ARTERIOVENOUS (AV) GORE-TEX GRAFT LEFT  ARM;  Surgeon: Elam Dutch, MD;  Location: [facility];  Service: Vascular;  Laterality: Left;  . CESAREAN SECTION    . CHOLECYSTECTOMY    . COLONOSCOPY    . EYE SURGERY Bilateral    diabetic   . toe amputation Right    5th toe     A IV Location/Drains/Wounds Patient Lines/Drains/Airways Status    Active Line/Drains/Airways    Name:   Placement date:   Placement time:   Site:   Days:   Peripheral IV 09/28/18 Right Antecubital   09/28/18    1308    Antecubital   64   Fistula / Graft Left Forearm Arteriovenous vein graft   06/19/18    1047    Forearm   165   Incision (Closed) 06/19/18 Arm Left   06/19/18    1047     165          Intake/Output Last 24 hours No intake or output data in the 24 hours ending 12/01/18 1340  Labs/Imaging Results for orders placed or performed during the hospital encounter of 12/01/18 (from the past 48 hour(s))  CBC with Differential     Status: Abnormal   Collection Time: 12/01/18 10:27 AM  Result Value Ref Range   WBC 11.5 (H) 4.0 - 10.5 K/uL   RBC 3.81 (L) 3.87 - 5.11 MIL/uL   Hemoglobin 10.6 (L) 12.0 - 15.0 g/dL   HCT 34.6 (L) 36.0 - 46.0 %   MCV 90.8 80.0 - 100.0 fL   MCH 27.8 26.0 - 34.0 pg   MCHC 30.6 30.0 - 36.0 g/dL   RDW 14.0 11.5 - 15.5 %   Platelets 289 150 - 400 K/uL   nRBC 0.2 0.0 - 0.2 %   Neutrophils Relative % 78 %   Neutro  Abs 9.1 (H) 1.7 - 7.7 K/uL   Lymphocytes Relative 13 %   Lymphs Abs 1.5 0.7 - 4.0 K/uL   Monocytes Relative 6 %   Monocytes Absolute 0.7 0.1 - 1.0 K/uL   Eosinophils Relative 1 %   Eosinophils Absolute 0.1 0.0 - 0.5 K/uL   Basophils Relative 1 %   Basophils Absolute 0.1 0.0 - 0.1 K/uL   Immature Granulocytes 1 %   Abs Immature Granulocytes 0.06 0.00 - 0.07 K/uL    Comment: Performed at [facility] [address], [place], [place] [address]  Comprehensive metabolic panel     Status: Abnormal   Collection Time: 12/01/18 10:27 AM  Result Value Ref Range   Sodium 137 135 - 145 mmol/L   Potassium 4.6 3.5 - 5.1 mmol/L    Comment: SLIGHT HEMOLYSIS   Chloride 98 98 - 111 mmol/L   CO2 21 (L) 22 - 32 mmol/L   Glucose, Bld 226 (H) 70 - 99 mg/dL   BUN 21 8 - 23 mg/dL   Creatinine, Ser 5.35 (H) 0.44 - 1.00 mg/dL   Calcium 9.3 8.9 - 10.3 mg/dL   Total Protein 7.3 6.5 - 8.1 g/dL   Albumin 3.5 3.5 -  5.0 g/dL   AST 37 15 - 41 U/L   ALT 26 0 - 44 U/L   Alkaline Phosphatase 63 38 - 126 U/L   Total Bilirubin 1.4 (H) 0.3 - 1.2 mg/dL   GFR calc non Af Amer 8 (L) >60 mL/min   GFR calc Af Amer 9 (L) >60 mL/min   Anion gap 18 (H) 5 - 15    Comment: Performed at [facility], [place] [address], [place], [place] [address]  Troponin I - ONCE - STAT     Status: None   Collection Time: 12/01/18 10:27 AM  Result Value Ref Range   Troponin I <0.03 <0.03 ng/mL    Comment: Performed at [facility] [address] [place], [place] [address]  Brain natriuretic peptide     Status: None   Collection Time: 12/01/18 10:30 AM  Result Value Ref Range   B Natriuretic Peptide 37.0 0.0 - 100.0 pg/mL    Comment: Performed at [facility] [address], [place], [place] [address]   Dg Chest 2 View  Result Date: 12/01/2018 CLINICAL DATA:  Shortness of breath and weakness for 4 days. Recent pneumonia. EXAM: CHEST - 2 VIEW COMPARISON:  09/09/2018 FINDINGS: The heart size and mediastinal contours are within normal limits. There has been resolution of bilateral pulmonary airspace disease since previous study. Both lungs are clear. No evidence of pleural effusion. Mid and lower thoracic degenerative disc disease noted. IMPRESSION: No active cardiopulmonary disease. Electronically Signed   By: Earle Gell M.D.   On: 12/01/2018 10:59    Pending Labs Unresulted Labs (From admission, onward)    Start     Ordered   12/01/18 1317  Troponin I - Now Then Q6H  Now then every 6 hours,   R     12/01/18 1316   Signed and Held  HIV antibody (Routine Testing)  Once,   R     Signed and Held          Vitals/Pain Today's Vitals   12/01/18 1024 12/01/18 1025 12/01/18 1026 12/01/18 1100  BP: (!) 146/64     Pulse:  87    Resp:  13    Temp:  TempSrc:      SpO2:  100%    Weight:   93 kg   Height:   5\' 9"  (1.753 m)   PainSc:   0-No pain 0-No pain    Isolation Precautions No active  isolations  Medications Medications  insulin aspart (novoLOG) injection 0-9 Units (has no administration in time range)  insulin aspart (novoLOG) injection 0-5 Units (has no administration in time range)    Mobility walks with person assist Low fall risk   Focused Assessments Cardiac Assessment Handoff:    Lab Results  Component Value Date   TROPONINI <0.03 12/01/2018   No results found for: DDIMER Does the Patient currently have chest pain? No     R Recommendations: See Admitting Provider Note  Report given to:   Additional Notes:  Pt noted today more SOB than usual, Dyspnea at rest, mod-sev dyspnea with exert and chest pressure as well. CP free at rest. Hx: CHF, CM, HTN, ischemia, CKD 5 with recent start of HD 3 weeks ago Plan to cath Mon/Tues, nephro to consult for HD maintenance A/O x 4, pleasant, not in distress, independent at home (is using bedside commode here to reduce exertion).

## 2018-12-01 NOTE — ED Triage Notes (Signed)
3-4 days SOB, weakness. Recovered from PNA recently in Jan 2020. Pt dyspneic at rest. Afebrile 97.7. 157/74 CBG 234 L arm Rx for HD started 3 weeks ago. 70s SR, 12 L EKG WNL. Sats 100% on 2L No swelling noted on exam. Slight tenting. EDP ast BS.

## 2018-12-01 NOTE — Progress Notes (Signed)
Patient's BP 102/52; Nitro SL given x 1 per order.  BP 83/56 after nitro. Pt states chest tightness is gone, but still having SOB. Placed on O2 @ 2L.

## 2018-12-01 NOTE — Plan of Care (Signed)

## 2018-12-01 NOTE — Consult Note (Signed)
Cardiology Consultation:   Patient ID: Priscilla Davis MRN: [id number]; DOB: 09-17-1950  Admit date: 12/01/2018 Date of Consult: 12/01/2018  Primary Care Provider: Kristen Loader, FNP Primary Cardiologist: Skeet Latch, MD    Patient Profile:   Priscilla Davis is a 68 y.o. female with a hx of diabetes mellitus, hypertension, end-stage renal disease recently initiated on dialysis who is being seen today for the evaluation of dyspnea and chest tightness at the request of Duffy Bruce MD.  History of Present Illness:   Patient was admitted January 2020 with chest tightness and CHF.  Echocardiogram showed ejection fraction 35 to 40%, global hypokinesis, grade 1 diastolic dysfunction and moderate left atrial enlargement.  Troponin was elevated but felt to be demand ischemia.  Catheterization was avoided due to risk of contrast nephropathy.  Nuclear study January 2020 showed ejection fraction 44%.  There was a basilar inferior, inferolateral, anterolateral nonreversible defect suggestive of infarct but no ischemia.  Patient recently started on hemodialysis 3 weeks ago.  She presents today with complaints of generalized weakness, dyspnea on exertion and chest tightness.  Her chest tightness worsens with exertion but also with deep inspiration.  She has not had fevers, chills, productive cough or hemoptysis.  She has not had syncope.  Cardiology asked to evaluate.  Note Dr. Oval Linsey had planned cardiac catheterization electively to evaluate cardiomyopathy once coronavirus pandemic improved.  Past Medical History:  Diagnosis Date  . Arthritis   . Asthma   . Depression   . Diabetes mellitus without complication ([place])    Type II  . GERD (gastroesophageal reflux disease)   . Hypertension   . Renal disorder    CKD 5    Past Surgical History:  Procedure Laterality Date  . AV FISTULA PLACEMENT Left 06/19/2018   Procedure: INSERTION OF 4-7MM X 45CM ARTERIOVENOUS (AV) GORE-TEX GRAFT LEFT  ARM;   Surgeon: Elam Dutch, MD;  Location: [place];  Service: Vascular;  Laterality: Left;  . CESAREAN SECTION    . CHOLECYSTECTOMY    . COLONOSCOPY    . EYE SURGERY Bilateral    diabetic   . toe amputation Right    5th toe     Inpatient Medications: Scheduled Meds:  Continuous Infusions:  PRN Meds:   Allergies:   No Known Allergies  Social History:   Social History   Socioeconomic History  . Marital status: Widowed    Spouse name: Not on file  . Number of children: 3  . Years of education: Not on file  . Highest education level: Not on file  Occupational History  . Not on file  Social Needs  . Financial resource strain: Not on file  . Food insecurity:    Worry: Not on file    Inability: Not on file  . Transportation needs:    Medical: Not on file    Non-medical: Not on file  Tobacco Use  . Smoking status: Former Smoker    Packs/day: 0.25    Years: 30.00    Pack years: 7.50    Types: Cigarettes    Last attempt to quit: 09/17/2018    Years since quitting: 0.2  . Smokeless tobacco: Never Used  Substance and Sexual Activity  . Alcohol use: Yes    Comment: seldom  . Drug use: Never  . Sexual activity: Not on file  Lifestyle  . Physical activity:    Days per week: Not on file    Minutes per session: Not on file  . Stress:  Not on file  Relationships  . Social connections:    Talks on phone: Not on file    Gets together: Not on file    Attends religious service: Not on file    Active member of club or organization: Not on file    Attends meetings of clubs or organizations: Not on file    Relationship status: Not on file  . Intimate partner violence:    Fear of current or ex partner: Not on file    Emotionally abused: Not on file    Physically abused: Not on file    Forced sexual activity: Not on file  Other Topics Concern  . Not on file  Social History Narrative  . Not on file    Family History:   Family History  Problem Relation Age of Onset  .  Peripheral vascular disease Mother   . Hypertension Mother      ROS:  Please see the history of present illness.  Patient describes generalized weakness but denies fevers, chills, productive cough or hemoptysis. All other ROS reviewed and negative.     Physical Exam/Data:   Vitals:   12/01/18 1021 12/01/18 1024 12/01/18 1025 12/01/18 1026  BP: (!) 146/64 (!) 146/64    Pulse: 86  87   Resp: 17  13   Temp: 98.6 F (37 C)     TempSrc: Oral     SpO2: 99%  100%   Weight:    93 kg  Height:    5\' 9"  (1.753 m)   No intake or output data in the 24 hours ending 12/01/18 1307 Last 3 Weights 12/01/2018 11/28/2018 09/28/2018  Weight (lbs) 205 lb 0.4 oz 205 lb 218 lb  Weight (kg) 93 kg 92.987 kg 98.884 kg     Body mass index is 30.28 kg/m.  General:  Well nourished, well developed, in no acute distress HEENT: normal Neck: no JVD Endocrine:  No thryomegaly Vascular: No carotid bruits; FA pulses 2+ bilaterally without bruits  Cardiac:  normal S1, S2; RRR; no murmur  Lungs:  clear to auscultation bilaterally, no wheezing, rhonchi or rales  Abd: soft, nontender, no hepatomegaly  Ext: no edema Musculoskeletal:  No deformities, BUE and BLE strength normal and equal Skin: warm and dry  Neuro:  CNs 2-12 intact, no focal abnormalities noted Psych:  Normal affect   EKG:  The EKG was personally reviewed and demonstrates: Sinus rhythm with nonspecific ST changes.   Relevant CV Studies: Nuclear study September 28, 2018  No changed from baseline EKG showing NSR with nonspecific ST/T wave abnormality in the inferolateral leads  Nuclear stress EF: 44%. There is hypokinesis of the basal and mid inferior and inferolateral walls.  The left ventricular ejection fraction is moderately decreased (30-44%).  There is a large defect of moderate severity present in the basal inferior, basal inferolateral, basal anterolateral, mid inferior and mid inferolateral location. The defect is non-reversible and  consistent with prior infarct. No ischemia noted.  This is an intermediate risk study.  There is evidence of focal spots of abnormal radiotracer uptake in blateral left and right breast area. Further workup with mammogram recommended.  Echocardiogram January 2020 - LVEF 35-40%, mild LVH, global hypokinesis, grade 1 DD, indeterminate LV filling pressure, trivial MR, moderate LAE, normal IVC.  Laboratory Data:  Chemistry Recent Labs  Lab 12/01/18 1027  NA 137  K 4.6  CL 98  CO2 21*  GLUCOSE 226*  BUN 21  CREATININE 5.35*  CALCIUM 9.3  GFRNONAA 8*  GFRAA 9*  ANIONGAP 18*    Recent Labs  Lab 12/01/18 1027  PROT 7.3  ALBUMIN 3.5  AST 37  ALT 26  ALKPHOS 63  BILITOT 1.4*   Hematology Recent Labs  Lab 12/01/18 1027  WBC 11.5*  RBC 3.81*  HGB 10.6*  HCT 34.6*  MCV 90.8  MCH 27.8  MCHC 30.6  RDW 14.0  PLT 289   Cardiac Enzymes Recent Labs  Lab 12/01/18 1027  TROPONINI <0.03   BNP Recent Labs  Lab 12/01/18 1030  BNP 37.0    Radiology/Studies:  Dg Chest 2 View  Result Date: 12/01/2018 CLINICAL DATA:  Shortness of breath and weakness for 4 days. Recent pneumonia. EXAM: CHEST - 2 VIEW COMPARISON:  09/09/2018 FINDINGS: The heart size and mediastinal contours are within normal limits. There has been resolution of bilateral pulmonary airspace disease since previous study. Both lungs are clear. No evidence of pleural effusion. Mid and lower thoracic degenerative disc disease noted. IMPRESSION: No active cardiopulmonary disease. Electronically Signed   By: Earle Gell M.D.   On: 12/01/2018 10:59    Assessment and Plan:   1. Chest tightness-there is a pleuritic component to symptoms and unclear that this is cardiac.  Electrocardiogram with nonspecific changes and initial troponin normal.  Would continue to cycle enzymes.  She does have a known cardiomyopathy and previous nuclear study showing infarct but no ischemia.  Cardiac catheterization had been planned  electively (previously delayed due to risk of contrast nephropathy but patient now dialysis dependent) but given increased symptoms and need for admission we will arrange catheterization on Monday.  The risks and benefits including myocardial infarction, CVA and death discussed and she agrees to proceed. 2. Presumed coronary artery disease-based on abnormal nuclear study showing infarct but no ischemia.  Continue aspirin and statin. 3. Presumed ischemic cardiomyopathy-plan to continue carvedilol.  Would discontinue amlodipine and treat with losartan 50 mg daily. 4. End-stage renal disease-patient's predominant complaints appear to be dyspnea, weakness and fatigue.  She will have a cardiac catheterization though not clear dyspnea is an anginal equivalent.  Dialysis adjustments per nephrology.  She was due for dialysis today.  Does patient need to continue Lasix given she is now on dialysis? 5. Hypertension-we will treat with carvedilol and ARB in light of reduced LV function. 6. Hyperlipidemia-continue statin.  For questions or updates, please contact [place] Please consult [email or web address] for contact info under     Signed, Kirk Ruths, MD  12/01/2018 1:07 PM

## 2018-12-02 DIAGNOSIS — E1122 Type 2 diabetes mellitus with diabetic chronic kidney disease: Secondary | ICD-10-CM | POA: Diagnosis present

## 2018-12-02 DIAGNOSIS — Z7982 Long term (current) use of aspirin: Secondary | ICD-10-CM | POA: Diagnosis not present

## 2018-12-02 DIAGNOSIS — I10 Essential (primary) hypertension: Secondary | ICD-10-CM | POA: Diagnosis not present

## 2018-12-02 DIAGNOSIS — M898X9 Other specified disorders of bone, unspecified site: Secondary | ICD-10-CM | POA: Diagnosis present

## 2018-12-02 DIAGNOSIS — Z8249 Family history of ischemic heart disease and other diseases of the circulatory system: Secondary | ICD-10-CM | POA: Diagnosis not present

## 2018-12-02 DIAGNOSIS — I214 Non-ST elevation (NSTEMI) myocardial infarction: Secondary | ICD-10-CM | POA: Diagnosis present

## 2018-12-02 DIAGNOSIS — I200 Unstable angina: Secondary | ICD-10-CM | POA: Diagnosis not present

## 2018-12-02 DIAGNOSIS — I132 Hypertensive heart and chronic kidney disease with heart failure and with stage 5 chronic kidney disease, or end stage renal disease: Secondary | ICD-10-CM | POA: Diagnosis present

## 2018-12-02 DIAGNOSIS — I9589 Other hypotension: Secondary | ICD-10-CM | POA: Diagnosis not present

## 2018-12-02 DIAGNOSIS — R0602 Shortness of breath: Secondary | ICD-10-CM

## 2018-12-02 DIAGNOSIS — I951 Orthostatic hypotension: Secondary | ICD-10-CM | POA: Diagnosis not present

## 2018-12-02 DIAGNOSIS — I255 Ischemic cardiomyopathy: Secondary | ICD-10-CM | POA: Diagnosis present

## 2018-12-02 DIAGNOSIS — D631 Anemia in chronic kidney disease: Secondary | ICD-10-CM | POA: Diagnosis present

## 2018-12-02 DIAGNOSIS — Z87891 Personal history of nicotine dependence: Secondary | ICD-10-CM | POA: Diagnosis not present

## 2018-12-02 DIAGNOSIS — I25118 Atherosclerotic heart disease of native coronary artery with other forms of angina pectoris: Secondary | ICD-10-CM | POA: Diagnosis not present

## 2018-12-02 DIAGNOSIS — E785 Hyperlipidemia, unspecified: Secondary | ICD-10-CM | POA: Diagnosis present

## 2018-12-02 DIAGNOSIS — I25110 Atherosclerotic heart disease of native coronary artery with unstable angina pectoris: Secondary | ICD-10-CM | POA: Diagnosis present

## 2018-12-02 DIAGNOSIS — R072 Precordial pain: Secondary | ICD-10-CM

## 2018-12-02 DIAGNOSIS — I2510 Atherosclerotic heart disease of native coronary artery without angina pectoris: Secondary | ICD-10-CM | POA: Diagnosis not present

## 2018-12-02 DIAGNOSIS — K219 Gastro-esophageal reflux disease without esophagitis: Secondary | ICD-10-CM | POA: Diagnosis present

## 2018-12-02 DIAGNOSIS — E1143 Type 2 diabetes mellitus with diabetic autonomic (poly)neuropathy: Secondary | ICD-10-CM | POA: Diagnosis present

## 2018-12-02 DIAGNOSIS — N186 End stage renal disease: Secondary | ICD-10-CM | POA: Diagnosis present

## 2018-12-02 DIAGNOSIS — Z955 Presence of coronary angioplasty implant and graft: Secondary | ICD-10-CM | POA: Diagnosis not present

## 2018-12-02 DIAGNOSIS — J4520 Mild intermittent asthma, uncomplicated: Secondary | ICD-10-CM | POA: Diagnosis present

## 2018-12-02 DIAGNOSIS — I5022 Chronic systolic (congestive) heart failure: Secondary | ICD-10-CM | POA: Diagnosis present

## 2018-12-02 DIAGNOSIS — M1990 Unspecified osteoarthritis, unspecified site: Secondary | ICD-10-CM | POA: Diagnosis present

## 2018-12-02 LAB — TROPONIN I: Troponin I: <0.03 ng/mL (ref <0.03)

## 2018-12-02 LAB — GLUCOSE, CAPILLARY
Glucose-Capillary: 157 mg/dL — ABNORMAL HIGH (ref 70–99)
Glucose-Capillary: 202 mg/dL — ABNORMAL HIGH (ref 70–99)
Glucose-Capillary: 227 mg/dL — ABNORMAL HIGH (ref 70–99)
Glucose-Capillary: 251 mg/dL — ABNORMAL HIGH (ref 70–99)

## 2018-12-02 LAB — HIV ANTIBODY (ROUTINE TESTING W REFLEX): HIV Screen 4th Generation wRfx: NONREACTIVE

## 2018-12-02 MED ORDER — INSULIN DETEMIR 100 UNIT/ML ~~LOC~~ SOLN
10.0000 [IU] | Freq: Every day | SUBCUTANEOUS | Status: DC
  Filled 2018-12-02 (×3): qty 0.1

## 2018-12-02 MED ORDER — SODIUM CHLORIDE 0.9% FLUSH
3.0000 mL | Freq: Two times a day (BID) | INTRAVENOUS | Status: DC
  Administered 2018-12-02: 3 mL via INTRAVENOUS

## 2018-12-02 MED ORDER — RENA-VITE PO TABS
1.0000 | ORAL_TABLET | Freq: Every day | ORAL | Status: DC
  Administered 2018-12-02 – 2018-12-11 (×10): 1 via ORAL
  Filled 2018-12-02 (×10): qty 1

## 2018-12-02 MED ORDER — ASPIRIN 81 MG PO CHEW
81.0000 mg | CHEWABLE_TABLET | ORAL | Status: AC
  Administered 2018-12-03: 81 mg via ORAL
  Filled 2018-12-02: qty 1

## 2018-12-02 MED ORDER — SODIUM CHLORIDE 0.9 % IV SOLN: 250.0000 mL | INTRAVENOUS | Status: DC | PRN

## 2018-12-02 MED ORDER — SODIUM CHLORIDE 0.9 % IV SOLN
INTRAVENOUS | Status: DC
  Administered 2018-12-03: 01:00:00 via INTRAVENOUS

## 2018-12-02 MED ORDER — INSULIN DETEMIR 100 UNIT/ML ~~LOC~~ SOLN: 10.0000 [IU] | Freq: Every day | SUBCUTANEOUS | Status: DC

## 2018-12-02 MED ORDER — SODIUM CHLORIDE 0.9% FLUSH: 3.0000 mL | INTRAVENOUS | Status: DC | PRN

## 2018-12-02 NOTE — Progress Notes (Addendum)
[facility] Progress Note   Subjective:  Seen in room. Restless overnight but no CP. Denies SOB but hasn't really moved at all. Doesn't recall any issues on HD yesterday.   Objective Vitals:   12/01/18 2302 12/01/18 2353 12/02/18 0000 12/02/18 0410  BP: (!) 83/56 104/64 110/65 (!) 107/54  Pulse:  82 87 86  Resp:      Temp:  97.8 F (36.6 C)  98 F (36.7 C)  TempSrc:  Oral  Oral  SpO2:  100% 99% 100%  Weight:    87.3 kg  Height:        Physical Exam General: WNWD female  Heart: RRR Lungs: CTAB No rales Abdomen: soft NTND Extremities: No LE edema  Dialysis Access: L forearm AVG +bruit    Weight change:    Additional Objective Labs: Basic Metabolic Panel: Recent Labs  Lab 12/01/18 1027  NA 137  K 4.6  CL 98  CO2 21*  GLUCOSE 226*  BUN 21  CREATININE 5.35*  CALCIUM 9.3   CBC: Recent Labs  Lab 12/01/18 1027  WBC 11.5*  NEUTROABS 9.1*  HGB 10.6*  HCT 34.6*  MCV 90.8  PLT 289   Blood Culture    Component Value Date/Time   SDES SPU 09/07/2018 1518   SDES SPU 09/07/2018 1518   SPECREQUEST NONE 09/07/2018 1518   SPECREQUEST NONE Reflexed from [id number] 09/07/2018 1518   CULT  09/07/2018 1518    FEW Consistent with normal respiratory flora. Performed at [facility], [place] [address], [place], [place] [address]    REPTSTATUS 09/10/2018 FINAL 09/07/2018 1518   REPTSTATUS 09/11/2018 FINAL 09/07/2018 1518     Medications: . sodium chloride    . [START ON 12/03/2018] sodium chloride     . [START ON 12/03/2018] aspirin  81 mg Oral Pre-Cath  . aspirin EC  81 mg Oral Daily  . carvedilol  12.5 mg Oral BID WC  . Chlorhexidine Gluconate Cloth  6 each Topical Q0600  . cholecalciferol  5,000 Units Oral Daily  . dorzolamide  1 drop Both Eyes BID  . FLUoxetine  20 mg Oral Daily  . gabapentin  300 mg Oral Daily  . heparin  5,000 Units Subcutaneous Q8H  . insulin aspart  0-5 Units Subcutaneous QHS  . insulin aspart  0-9 Units Subcutaneous TID  WC  . insulin detemir  10 Units Subcutaneous Daily  . latanoprost  1 drop Both Eyes QHS  . pantoprazole  40 mg Oral Daily  . rosuvastatin  40 mg Oral QHS  . sodium chloride flush  3 mL Intravenous Q12H  . vitamin B-12  1,000 mcg Oral Daily    [facility] TTS 4h 180NRe 400/1.5x EDW 91kg 3K/2.25 Ca  L AVG  Hep 2000 Bolus Venofer 50mg  IV q week  Mircera 14mcg IV q 2 weeks last 3/31   Assessment/Plan: 1. Chest pain/DOE -hx CAD/cardiomyopathy per cardiology> following with plans for cardiac cath Monday  2. ESRD -  recent HD start. 1st HD 11/15/18. HD TTS. Next HD 4/7.   3. Hypertension -BPs soft here. Home meds amlodipine 10, carvedilol 12.5 bid, furosemide 160 bid? Marland Kitchen Unable to tolerate UF on 4/4 with SBPs dropping early into 70s. Amlodipine /Lasix have been stopped. Remains on carvedilol.   4. Volume -  CXR clear. No volume excess on exam. Post HD wt 4/4 88kg net UF 700 mL.   5. Anemia  - Hgb 10.6. On ESA as outpatient   6. Metabolic bone disease -  Ca ok. No VDRA or binders yet   7. Nutrition - Renal diet/vitamins  8. DM - insulin per primary   Lynnda Child PA-C [facility] Pager [phone] 12/02/2018,9:36 AM  LOS: 0 days   Pt seen, examined and agree w A/P as above.  [facility] 12/02/2018, 5:06 PM

## 2018-12-02 NOTE — Plan of Care (Signed)
  Problem: Clinical Measurements: Goal: Respiratory complications will improve Outcome: Progressing Goal: Cardiovascular complication will be avoided Outcome: Progressing   

## 2018-12-02 NOTE — Progress Notes (Signed)
Progress Note  Patient Name: Priscilla Davis Date of Encounter: 12/02/2018  Primary Cardiologist: Skeet Latch, MD   Subjective   Still weak; mild dyspnea; Recurrent chest tightness last PM now resolved  Inpatient Medications    Scheduled Meds:  aspirin EC  81 mg Oral Daily   carvedilol  12.5 mg Oral BID WC   Chlorhexidine Gluconate Cloth  6 each Topical Q0600   cholecalciferol  5,000 Units Oral Daily   dorzolamide  1 drop Both Eyes BID   FLUoxetine  20 mg Oral Daily   gabapentin  300 mg Oral Daily   heparin  5,000 Units Subcutaneous Q8H   insulin aspart  0-5 Units Subcutaneous QHS   insulin aspart  0-9 Units Subcutaneous TID WC   insulin detemir  15 Units Subcutaneous Daily   latanoprost  1 drop Both Eyes QHS   pantoprazole  40 mg Oral Daily   rosuvastatin  40 mg Oral QHS   vitamin B-12  1,000 mcg Oral Daily   Continuous Infusions:  PRN Meds: acetaminophen, albuterol, morphine injection, nitroGLYCERIN   Vital Signs    Vitals:   12/01/18 2302 12/01/18 2353 12/02/18 0000 12/02/18 0410  BP: (!) 83/56 104/64 110/65 (!) 107/54  Pulse:  82 87 86  Resp:      Temp:  97.8 F (36.6 C)  98 F (36.7 C)  TempSrc:  Oral  Oral  SpO2:  100% 99% 100%  Weight:    87.3 kg  Height:        Intake/Output Summary (Last 24 hours) at 12/02/2018 0730 Last data filed at 12/01/2018 1946 Gross per 24 hour  Intake 222 ml  Output 700 ml  Net -478 ml   Last 3 Weights 12/02/2018 12/01/2018 12/01/2018  Weight (lbs) 192 lb 7.4 oz 194 lb 0.1 oz 194 lb 14.2 oz  Weight (kg) 87.3 kg 88 kg 88.4 kg      Telemetry    Sinus with PACs- Personally Reviewed  Physical Exam   GEN: No acute distress.   Neck: No JVD Cardiac: RRR, no murmurs, rubs, or gallops.  Respiratory: Clear to auscultation bilaterally. GI: Soft, nontender, non-distended  MS: No edema Neuro:  Nonfocal  Psych: Normal affect   Labs    Chemistry Recent Labs  Lab 12/01/18 1027  NA 137  K 4.6  CL 98  CO2  21*  GLUCOSE 226*  BUN 21  CREATININE 5.35*  CALCIUM 9.3  PROT 7.3  ALBUMIN 3.5  AST 37  ALT 26  ALKPHOS 63  BILITOT 1.4*  GFRNONAA 8*  GFRAA 9*  ANIONGAP 18*     Hematology Recent Labs  Lab 12/01/18 1027  WBC 11.5*  RBC 3.81*  HGB 10.6*  HCT 34.6*  MCV 90.8  MCH 27.8  MCHC 30.6  RDW 14.0  PLT 289    Cardiac Enzymes Recent Labs  Lab 12/01/18 1027 12/01/18 1332 12/01/18 2019 12/02/18 0213  TROPONINI <0.03 <0.03 <0.03 <0.03    BNP Recent Labs  Lab 12/01/18 1030  BNP 37.0     Radiology    Dg Chest 2 View  Result Date: 12/01/2018 CLINICAL DATA:  Shortness of breath and weakness for 4 days. Recent pneumonia. EXAM: CHEST - 2 VIEW COMPARISON:  09/09/2018 FINDINGS: The heart size and mediastinal contours are within normal limits. There has been resolution of bilateral pulmonary airspace disease since previous study. Both lungs are clear. No evidence of pleural effusion. Mid and lower thoracic degenerative disc disease noted. IMPRESSION: No active cardiopulmonary  disease. Electronically Signed   By: Earle Gell M.D.   On: 12/01/2018 10:59    Patient Profile     Angelo Caroll is a 68 y.o. female with a hx of diabetes mellitus, hypertension, end-stage renal disease recently initiated on dialysis who is being seen for the evaluation of dyspnea and chest tightness. Patient was admitted January 2020 with chest tightness and CHF. Echocardiogram showed ejection fraction 35 to 40%, global hypokinesis, grade 1 diastolic dysfunction and moderate left atrial enlargement.  Troponin was elevated but felt to be demand ischemia.  Catheterization was avoided due to risk of contrast nephropathy.  Nuclear study January 2020 showed ejection fraction 44%.  There was a basilar inferior, inferolateral, anterolateral nonreversible defect suggestive of infarct but no ischemia.  Patient recently started on hemodialysis 3 weeks ago.  Dr. Oval Linsey had planned cardiac catheterization electively to  evaluate cardiomyopathy once coronavirus pandemic improved.  Patient presented with dyspnea, weakness and chest tightness.  Assessment & Plan    1. Chest tightness-recurrent last evening but now pain-free.  Pt has known cardiomyopathy and previous nuclear study showing infarct but no ischemia.  Cardiac catheterization had been planned electively (previously delayed due to risk of contrast nephropathy but patient now dialysis dependent) but given increased symptoms plan for catheterization on Monday.  The risks and benefits including myocardial infarction, CVA and death discussed and she agrees to proceed. 2. Presumed coronary artery disease-based on abnormal nuclear study showing infarct but no ischemia.    Plan to continue medical therapy with aspirin and statin. 3. Presumed ischemic cardiomyopathy-blood pressure is borderline on dialysis days.  Amlodipine discontinued.  We will continue low-dose carvedilol if tolerated.  I will not add an ARB given borderline blood pressure. 4. End-stage renal disease-dialysis per nephrology. 5. Hypertension-hold amlodipine and ARB as outlined.  Continue carvedilol if tolerated. 6. Hyperlipidemia-continue statin.  For questions or updates, please contact [facility] Please consult [email or web address] for contact info under        Signed, Kirk Ruths, MD  12/02/2018, 7:30 AM

## 2018-12-02 NOTE — H&P (View-Only) (Signed)
Progress Note  Patient Name: Priscilla Davis Date of Encounter: 12/02/2018  Primary Cardiologist: Skeet Latch, MD   Subjective   Still weak; mild dyspnea; Recurrent chest tightness last PM now resolved  Inpatient Medications    Scheduled Meds:  aspirin EC  81 mg Oral Daily   carvedilol  12.5 mg Oral BID WC   Chlorhexidine Gluconate Cloth  6 each Topical Q0600   cholecalciferol  5,000 Units Oral Daily   dorzolamide  1 drop Both Eyes BID   FLUoxetine  20 mg Oral Daily   gabapentin  300 mg Oral Daily   heparin  5,000 Units Subcutaneous Q8H   insulin aspart  0-5 Units Subcutaneous QHS   insulin aspart  0-9 Units Subcutaneous TID WC   insulin detemir  15 Units Subcutaneous Daily   latanoprost  1 drop Both Eyes QHS   pantoprazole  40 mg Oral Daily   rosuvastatin  40 mg Oral QHS   vitamin B-12  1,000 mcg Oral Daily   Continuous Infusions:  PRN Meds: acetaminophen, albuterol, morphine injection, nitroGLYCERIN   Vital Signs    Vitals:   12/01/18 2302 12/01/18 2353 12/02/18 0000 12/02/18 0410  BP: (!) 83/56 104/64 110/65 (!) 107/54  Pulse:  82 87 86  Resp:      Temp:  97.8 F (36.6 C)  98 F (36.7 C)  TempSrc:  Oral  Oral  SpO2:  100% 99% 100%  Weight:    87.3 kg  Height:        Intake/Output Summary (Last 24 hours) at 12/02/2018 0730 Last data filed at 12/01/2018 1946 Gross per 24 hour  Intake 222 ml  Output 700 ml  Net -478 ml   Last 3 Weights 12/02/2018 12/01/2018 12/01/2018  Weight (lbs) 192 lb 7.4 oz 194 lb 0.1 oz 194 lb 14.2 oz  Weight (kg) 87.3 kg 88 kg 88.4 kg      Telemetry    Sinus with PACs- Personally Reviewed  Physical Exam   GEN: No acute distress.   Neck: No JVD Cardiac: RRR, no murmurs, rubs, or gallops.  Respiratory: Clear to auscultation bilaterally. GI: Soft, nontender, non-distended  MS: No edema Neuro:  Nonfocal  Psych: Normal affect   Labs    Chemistry Recent Labs  Lab 12/01/18 1027  NA 137  K 4.6  CL 98  CO2  21*  GLUCOSE 226*  BUN 21  CREATININE 5.35*  CALCIUM 9.3  PROT 7.3  ALBUMIN 3.5  AST 37  ALT 26  ALKPHOS 63  BILITOT 1.4*  GFRNONAA 8*  GFRAA 9*  ANIONGAP 18*     Hematology Recent Labs  Lab 12/01/18 1027  WBC 11.5*  RBC 3.81*  HGB 10.6*  HCT 34.6*  MCV 90.8  MCH 27.8  MCHC 30.6  RDW 14.0  PLT 289    Cardiac Enzymes Recent Labs  Lab 12/01/18 1027 12/01/18 1332 12/01/18 2019 12/02/18 0213  TROPONINI <0.03 <0.03 <0.03 <0.03    BNP Recent Labs  Lab 12/01/18 1030  BNP 37.0     Radiology    Dg Chest 2 View  Result Date: 12/01/2018 CLINICAL DATA:  Shortness of breath and weakness for 4 days. Recent pneumonia. EXAM: CHEST - 2 VIEW COMPARISON:  09/09/2018 FINDINGS: The heart size and mediastinal contours are within normal limits. There has been resolution of bilateral pulmonary airspace disease since previous study. Both lungs are clear. No evidence of pleural effusion. Mid and lower thoracic degenerative disc disease noted. IMPRESSION: No active cardiopulmonary  disease. Electronically Signed   By: Earle Gell M.D.   On: 12/01/2018 10:59    Patient Profile     Priscilla Davis is a 68 y.o. female with a hx of diabetes mellitus, hypertension, end-stage renal disease recently initiated on dialysis who is being seen for the evaluation of dyspnea and chest tightness. Patient was admitted January 2020 with chest tightness and CHF. Echocardiogram showed ejection fraction 35 to 40%, global hypokinesis, grade 1 diastolic dysfunction and moderate left atrial enlargement.  Troponin was elevated but felt to be demand ischemia.  Catheterization was avoided due to risk of contrast nephropathy.  Nuclear study January 2020 showed ejection fraction 44%.  There was a basilar inferior, inferolateral, anterolateral nonreversible defect suggestive of infarct but no ischemia.  Patient recently started on hemodialysis 3 weeks ago.  Dr. Oval Linsey had planned cardiac catheterization electively to  evaluate cardiomyopathy once coronavirus pandemic improved.  Patient presented with dyspnea, weakness and chest tightness.  Assessment & Plan    1. Chest tightness-recurrent last evening but now pain-free.  Pt has known cardiomyopathy and previous nuclear study showing infarct but no ischemia.  Cardiac catheterization had been planned electively (previously delayed due to risk of contrast nephropathy but patient now dialysis dependent) but given increased symptoms plan for catheterization on Monday.  The risks and benefits including myocardial infarction, CVA and death discussed and she agrees to proceed. 2. Presumed coronary artery disease-based on abnormal nuclear study showing infarct but no ischemia.    Plan to continue medical therapy with aspirin and statin. 3. Presumed ischemic cardiomyopathy-blood pressure is borderline on dialysis days.  Amlodipine discontinued.  We will continue low-dose carvedilol if tolerated.  I will not add an ARB given borderline blood pressure. 4. End-stage renal disease-dialysis per nephrology. 5. Hypertension-hold amlodipine and ARB as outlined.  Continue carvedilol if tolerated. 6. Hyperlipidemia-continue statin.  For questions or updates, please contact [place] Please consult [email or web address] for contact info under        Signed, Kirk Ruths, MD  12/02/2018, 7:30 AM

## 2018-12-02 NOTE — Progress Notes (Signed)
PROGRESS NOTE    Priscilla Davis   QBV:[id number]  DOB: 28-Aug-1951  DOA: 12/01/2018 PCP: Priscilla Loader, FNP   Brief Narrative:  Priscilla Davis is a 68 y.o. female with medical history significant of ESRD recently started on hemodialysis through left AV fistula, cardiomyopathy, abnormal nuclear stress test, EF of 40%, hypertension and diabetes , recently quit a smoker who presents to the emergency room with worsening shortness of breath and chest discomfort for 3 weeks.  She was admitted January 2020 with chest tightness and CHF. Echocardiogram showed ejection fraction 35 to 40%, global hypokinesis, grade 1 diastolic dysfunction and moderate left atrial enlargement. Myoview showed basilar inferior, inferolateral, anterolateral nonreversible defect suggestive of infarct but no ischemia. Cath was not done to prevent renal failure. She started HD about 3 wks ago and comes back to the hospital for ongoing episodes of chest pain.    Subjective: No complaints of chest pain currently. No other complants.     Assessment & Plan:   Principal Problem:   Chest pain   SOB (shortness of breath) - appreciate cardiology eval- for cath tomorrow - she is received PRn Nitro for chest pressure in the hospital - cont ASA and statin   Active Problems:   ESRD (end stage renal disease)  - received dialysis yesterday- on TTS schedule asoutpt - has AVF   Anemia of chronic disease - cont management per nephrology  Hypotension with h/o HTN - no longer on Norvasc, Coreg- does not need Lasix either   DM2 - cont Levemir (reduce dose for cath) and ISS - Victoza on hold  Time spent in minutes: 35 DVT prophylaxis: Heparin Code Status: Full code Family Communication: patient alert Disposition Plan: cath tomorrow Consultants:   Nephrology  cardiology Procedures:     Antimicrobials:  Anti-infectives (From admission, onward)   None       Objective: Vitals:   12/01/18 2302 12/01/18 2353 12/02/18  0000 12/02/18 0410  BP: (!) 83/56 104/64 110/65 (!) 107/54  Pulse:  82 87 86  Resp:      Temp:  97.8 F (36.6 C)  98 F (36.7 C)  TempSrc:  Oral  Oral  SpO2:  100% 99% 100%  Weight:    87.3 kg  Height:        Intake/Output Summary (Last 24 hours) at 12/02/2018 0900 Last data filed at 12/01/2018 1946 Gross per 24 hour  Intake 222 ml  Output 700 ml  Net -478 ml   Filed Weights   12/01/18 1546 12/01/18 1946 12/02/18 0410  Weight: 88.4 kg 88 kg 87.3 kg    Examination: General exam: Appears comfortable  HEENT: PERRLA, oral mucosa moist, no sclera icterus or thrush Respiratory system: Clear to auscultation. Respiratory effort normal. Cardiovascular system: S1 & S2 heard, RRR.   Gastrointestinal system: Abdomen soft, non-tender, nondistended. Normal bowel sounds. Central nervous system: Alert and oriented. No focal neurological deficits. Extremities: No cyanosis, clubbing or edema Skin: No rashes or ulcers Psychiatry:  Mood & affect appropriate.     Data Reviewed: I have personally reviewed following labs and imaging studies  CBC: Recent Labs  Lab 12/01/18 1027  WBC 11.5*  NEUTROABS 9.1*  HGB 10.6*  HCT 34.6*  MCV 90.8  PLT 280   Basic Metabolic Panel: Recent Labs  Lab 12/01/18 1027  NA 137  K 4.6  CL 98  CO2 21*  GLUCOSE 226*  BUN 21  CREATININE 5.35*  CALCIUM 9.3   GFR: Estimated Creatinine Clearance: 11.7 mL/min (  A) (by C-G formula based on SCr of 5.35 mg/dL (H)). Liver Function Tests: Recent Labs  Lab 12/01/18 1027  AST 37  ALT 26  ALKPHOS 63  BILITOT 1.4*  PROT 7.3  ALBUMIN 3.5   No results for input(s): LIPASE, AMYLASE in the last 168 hours. No results for input(s): AMMONIA in the last 168 hours. Coagulation Profile: No results for input(s): INR, PROTIME in the last 168 hours. Cardiac Enzymes: Recent Labs  Lab 12/01/18 1027 12/01/18 1332 12/01/18 2019 12/02/18 0213  TROPONINI <0.03 <0.03 <0.03 <0.03   BNP (last 3 results) No  results for input(s): PROBNP in the last 8760 hours. HbA1C: No results for input(s): HGBA1C in the last 72 hours. CBG: Recent Labs  Lab 12/01/18 1506 12/01/18 2123 12/02/18 0809  GLUCAP 160* 158* 157*   Lipid Profile: No results for input(s): CHOL, HDL, LDLCALC, TRIG, CHOLHDL, LDLDIRECT in the last 72 hours. Thyroid Function Tests: No results for input(s): TSH, T4TOTAL, FREET4, T3FREE, THYROIDAB in the last 72 hours. Anemia Panel: No results for input(s): VITAMINB12, FOLATE, FERRITIN, TIBC, IRON, RETICCTPCT in the last 72 hours. Urine analysis:    Component Value Date/Time   COLORURINE YELLOW 07/26/2017 1000   APPEARANCEUR HAZY (A) 07/26/2017 1000   LABSPEC 1.015 07/26/2017 1000   PHURINE 6.0 07/26/2017 1000   GLUCOSEU NEGATIVE 07/26/2017 1000   HGBUR NEGATIVE 07/26/2017 1000   BILIRUBINUR NEGATIVE 07/26/2017 1000   KETONESUR NEGATIVE 07/26/2017 1000   PROTEINUR 100 (A) 07/26/2017 1000   NITRITE NEGATIVE 07/26/2017 1000   LEUKOCYTESUR TRACE (A) 07/26/2017 1000   Sepsis Labs: @LABRCNTIP (procalcitonin:4,lacticidven:4) )No results found for this or any previous visit (from the past 240 hour(s)).       Radiology Studies: Dg Chest 2 View  Result Date: 12/01/2018 CLINICAL DATA:  Shortness of breath and weakness for 4 days. Recent pneumonia. EXAM: CHEST - 2 VIEW COMPARISON:  09/09/2018 FINDINGS: The heart size and mediastinal contours are within normal limits. There has been resolution of bilateral pulmonary airspace disease since previous study. Both lungs are clear. No evidence of pleural effusion. Mid and lower thoracic degenerative disc disease noted. IMPRESSION: No active cardiopulmonary disease. Electronically Signed   By: Earle Gell M.D.   On: 12/01/2018 10:59      Scheduled Meds: . [START ON 12/03/2018] aspirin  81 mg Oral Pre-Cath  . aspirin EC  81 mg Oral Daily  . carvedilol  12.5 mg Oral BID WC  . Chlorhexidine Gluconate Cloth  6 each Topical Q0600  .  cholecalciferol  5,000 Units Oral Daily  . dorzolamide  1 drop Both Eyes BID  . FLUoxetine  20 mg Oral Daily  . gabapentin  300 mg Oral Daily  . heparin  5,000 Units Subcutaneous Q8H  . insulin aspart  0-5 Units Subcutaneous QHS  . insulin aspart  0-9 Units Subcutaneous TID WC  . insulin detemir  15 Units Subcutaneous Daily  . latanoprost  1 drop Both Eyes QHS  . pantoprazole  40 mg Oral Daily  . rosuvastatin  40 mg Oral QHS  . sodium chloride flush  3 mL Intravenous Q12H  . vitamin B-12  1,000 mcg Oral Daily   Continuous Infusions: . sodium chloride    . [START ON 12/03/2018] sodium chloride       LOS: 0 days      Debbe Odea, MD [facility] Pager: [email or web address] Password [email or web address] 12/02/2018, 9:00 AM

## 2018-12-03 ENCOUNTER — Encounter (HOSPITAL_COMMUNITY)
Admission: EM | Disposition: A | Discharge status: Home Health | Payer: Self-pay | Source: Home / Self Care | Attending: Internal Medicine

## 2018-12-03 DIAGNOSIS — I214 Non-ST elevation (NSTEMI) myocardial infarction: Principal | ICD-10-CM

## 2018-12-03 DIAGNOSIS — J4520 Mild intermittent asthma, uncomplicated: Secondary | ICD-10-CM

## 2018-12-03 DIAGNOSIS — I2510 Atherosclerotic heart disease of native coronary artery without angina pectoris: Secondary | ICD-10-CM

## 2018-12-03 HISTORY — PX: LEFT HEART CATH AND CORONARY ANGIOGRAPHY: CATH118249

## 2018-12-03 HISTORY — PX: CORONARY STENT INTERVENTION: CATH118234

## 2018-12-03 LAB — CBC
HCT: 30.1 % — ABNORMAL LOW (ref 36.0–46.0)
Hemoglobin: 9.5 g/dL — ABNORMAL LOW (ref 12.0–15.0)
MCH: 28.6 pg (ref 26.0–34.0)
MCHC: 31.6 g/dL (ref 30.0–36.0)
MCV: 90.7 fL (ref 80.0–100.0)
Platelets: 256 10*3/uL (ref 150–400)
RBC: 3.32 MIL/uL — ABNORMAL LOW (ref 3.87–5.11)
RDW: 14.4 % (ref 11.5–15.5)
WBC: 9.9 10*3/uL (ref 4.0–10.5)
nRBC: 0.3 % — ABNORMAL HIGH (ref 0.0–0.2)

## 2018-12-03 LAB — IRON AND TIBC
Iron: 39 ug/dL (ref 28–170)
Saturation Ratios: 21 % (ref 10.4–31.8)
TIBC: 185 ug/dL — ABNORMAL LOW (ref 250–450)
UIBC: 146 ug/dL

## 2018-12-03 LAB — BASIC METABOLIC PANEL
Anion gap: 13 (ref 5–15)
BUN: 30 mg/dL — ABNORMAL HIGH (ref 8–23)
CO2: 24 mmol/L (ref 22–32)
Calcium: 9 mg/dL (ref 8.9–10.3)
Chloride: 99 mmol/L (ref 98–111)
Creatinine, Ser: 5.81 mg/dL — ABNORMAL HIGH (ref 0.44–1.00)
GFR calc Af Amer: 8 mL/min — ABNORMAL LOW (ref >60)
GFR calc non Af Amer: 7 mL/min — ABNORMAL LOW (ref >60)
Glucose, Bld: 214 mg/dL — ABNORMAL HIGH (ref 70–99)
Potassium: 3.3 mmol/L — ABNORMAL LOW (ref 3.5–5.1)
Sodium: 136 mmol/L (ref 135–145)

## 2018-12-03 LAB — GLUCOSE, CAPILLARY
Glucose-Capillary: 185 mg/dL — ABNORMAL HIGH (ref 70–99)
Glucose-Capillary: 126 mg/dL — ABNORMAL HIGH (ref 70–99)

## 2018-12-03 LAB — POCT ACTIVATED CLOTTING TIME
Activated Clotting Time: 235 seconds
Activated Clotting Time: 246 seconds
Activated Clotting Time: 340 seconds
Activated Clotting Time: 296 seconds

## 2018-12-03 SURGERY — LEFT HEART CATH AND CORONARY ANGIOGRAPHY
Anesthesia: LOCAL

## 2018-12-03 MED ORDER — ASPIRIN 81 MG PO CHEW: 81.0000 mg | CHEWABLE_TABLET | Freq: Every day | ORAL | Status: DC

## 2018-12-03 MED ORDER — SODIUM CHLORIDE 0.9 % IV SOLN: INTRAVENOUS | Status: AC

## 2018-12-03 MED ORDER — SODIUM CHLORIDE 0.9% FLUSH
3.0000 mL | Freq: Two times a day (BID) | INTRAVENOUS | Status: DC
  Administered 2018-12-04 – 2018-12-10 (×10): 3 mL via INTRAVENOUS

## 2018-12-03 MED ORDER — BIVALIRUDIN TRIFLUOROACETATE 250 MG IV SOLR
INTRAVENOUS | Status: AC
  Filled 2018-12-03: qty 250

## 2018-12-03 MED ORDER — MIDAZOLAM HCL 2 MG/2ML IJ SOLN
INTRAMUSCULAR | Status: AC
  Filled 2018-12-03: qty 2

## 2018-12-03 MED ORDER — TICAGRELOR 90 MG PO TABS
90.0000 mg | ORAL_TABLET | Freq: Two times a day (BID) | ORAL | Status: DC
  Administered 2018-12-03 – 2018-12-12 (×18): 90 mg via ORAL
  Filled 2018-12-03 (×18): qty 1

## 2018-12-03 MED ORDER — CHLORHEXIDINE GLUCONATE CLOTH 2 % EX PADS: 6.0000 | MEDICATED_PAD | Freq: Every day | CUTANEOUS | Status: DC

## 2018-12-03 MED ORDER — IOHEXOL 350 MG/ML SOLN
INTRAVENOUS | Status: DC | PRN
  Administered 2018-12-03: 145 mL via INTRA_ARTERIAL

## 2018-12-03 MED ORDER — HYDRALAZINE HCL 20 MG/ML IJ SOLN: 10.0000 mg | INTRAMUSCULAR | Status: AC | PRN

## 2018-12-03 MED ORDER — LIDOCAINE HCL (PF) 1 % IJ SOLN
INTRAMUSCULAR | Status: AC
  Filled 2018-12-03: qty 30

## 2018-12-03 MED ORDER — LABETALOL HCL 5 MG/ML IV SOLN: 10.0000 mg | INTRAVENOUS | Status: AC | PRN

## 2018-12-03 MED ORDER — DARBEPOETIN ALFA 40 MCG/0.4ML IJ SOSY
40.0000 ug | PREFILLED_SYRINGE | INTRAMUSCULAR | Status: DC
  Administered 2018-12-04 – 2018-12-11 (×2): 40 ug via INTRAVENOUS
  Filled 2018-12-03 (×2): qty 0.4

## 2018-12-03 MED ORDER — MORPHINE SULFATE (PF) 10 MG/ML IV SOLN: 2.0000 mg | INTRAVENOUS | Status: DC | PRN

## 2018-12-03 MED ORDER — TICAGRELOR 90 MG PO TABS
ORAL_TABLET | ORAL | Status: AC
  Filled 2018-12-03: qty 2

## 2018-12-03 MED ORDER — SODIUM CHLORIDE 0.9 % IV SOLN
1.0000 mg/kg/h | INTRAVENOUS | Status: AC
  Administered 2018-12-03: 1 mg/kg/h via INTRAVENOUS

## 2018-12-03 MED ORDER — SODIUM CHLORIDE 0.9% FLUSH
3.0000 mL | INTRAVENOUS | Status: DC | PRN
  Administered 2018-12-09: 3 mL via INTRAVENOUS
  Filled 2018-12-03: qty 3

## 2018-12-03 MED ORDER — TICAGRELOR 90 MG PO TABS
ORAL_TABLET | ORAL | Status: DC | PRN
  Administered 2018-12-03: 180 mg via ORAL

## 2018-12-03 MED ORDER — MORPHINE SULFATE (PF) 2 MG/ML IV SOLN: 2.0000 mg | INTRAVENOUS | Status: DC | PRN

## 2018-12-03 MED ORDER — BIVALIRUDIN BOLUS VIA INFUSION - CUPID
INTRAVENOUS | Status: DC | PRN
  Administered 2018-12-03: 65.475 mg via INTRAVENOUS

## 2018-12-03 MED ORDER — ONDANSETRON HCL 4 MG/2ML IJ SOLN: 4.0000 mg | Freq: Four times a day (QID) | INTRAMUSCULAR | Status: DC | PRN

## 2018-12-03 MED ORDER — HEPARIN (PORCINE) IN NACL 1000-0.9 UT/500ML-% IV SOLN
INTRAVENOUS | Status: DC | PRN
  Administered 2018-12-03 (×2): 500 mL

## 2018-12-03 MED ORDER — SODIUM CHLORIDE 0.9 % IV SOLN
INTRAVENOUS | Status: AC | PRN
  Administered 2018-12-03: 250 mL via INTRAVENOUS

## 2018-12-03 MED ORDER — HEPARIN (PORCINE) IN NACL 1000-0.9 UT/500ML-% IV SOLN
INTRAVENOUS | Status: AC
  Filled 2018-12-03: qty 500

## 2018-12-03 MED ORDER — SODIUM CHLORIDE 0.9 % IV SOLN: 250.0000 mL | INTRAVENOUS | Status: DC | PRN

## 2018-12-03 MED ORDER — LIDOCAINE HCL (PF) 1 % IJ SOLN
INTRAMUSCULAR | Status: DC | PRN
  Administered 2018-12-03: 15 mL

## 2018-12-03 MED ORDER — SODIUM CHLORIDE 0.9 % IV SOLN
INTRAVENOUS | Status: AC | PRN
  Administered 2018-12-03 (×2): 1 mg/kg/h via INTRAVENOUS

## 2018-12-03 MED ORDER — ATORVASTATIN CALCIUM 80 MG PO TABS: 80.0000 mg | ORAL_TABLET | Freq: Every day | ORAL | Status: DC

## 2018-12-03 MED ORDER — FENTANYL CITRATE (PF) 100 MCG/2ML IJ SOLN
INTRAMUSCULAR | Status: AC
  Filled 2018-12-03: qty 2

## 2018-12-03 MED ORDER — MIDAZOLAM HCL 2 MG/2ML IJ SOLN
INTRAMUSCULAR | Status: DC | PRN
  Administered 2018-12-03 (×2): 1 mg via INTRAVENOUS

## 2018-12-03 MED ORDER — ACETAMINOPHEN 325 MG PO TABS
650.0000 mg | ORAL_TABLET | ORAL | Status: DC | PRN
  Filled 2018-12-03: qty 2

## 2018-12-03 MED ORDER — HEPARIN (PORCINE) IN NACL 1000-0.9 UT/500ML-% IV SOLN
INTRAVENOUS | Status: AC
  Filled 2018-12-03: qty 1000

## 2018-12-03 MED ORDER — FENTANYL CITRATE (PF) 100 MCG/2ML IJ SOLN
INTRAMUSCULAR | Status: DC | PRN
  Administered 2018-12-03 (×2): 25 ug via INTRAVENOUS

## 2018-12-03 MED ORDER — INSULIN DETEMIR 100 UNIT/ML ~~LOC~~ SOLN
14.0000 [IU] | Freq: Every day | SUBCUTANEOUS | Status: DC
  Administered 2018-12-05 – 2018-12-12 (×8): 14 [IU] via SUBCUTANEOUS
  Filled 2018-12-03 (×11): qty 0.14

## 2018-12-03 SURGICAL SUPPLY — 16 items
BALLN SAPPHIRE 2.0X12 (BALLOONS) ×2
BALLOON SAPPHIRE 2.0X12 (BALLOONS) ×1 IMPLANT
CATH INFINITI 5FR MULTPACK ANG (CATHETERS) ×2 IMPLANT
CATH VISTA GUIDE 6FR JR4 (CATHETERS) ×2 IMPLANT
DEVICE CONTINUOUS FLUSH (MISCELLANEOUS) ×2 IMPLANT
KIT ENCORE 26 ADVANTAGE (KITS) ×2 IMPLANT
KIT HEART LEFT (KITS) ×2 IMPLANT
PACK CARDIAC CATHETERIZATION (CUSTOM PROCEDURE TRAY) ×2 IMPLANT
SHEATH PINNACLE 5F 10CM (SHEATH) ×2 IMPLANT
SHEATH PINNACLE 6F 10CM (SHEATH) ×2 IMPLANT
STENT SYNERGY DES 2.25X12 (Permanent Stent) ×2 IMPLANT
SYR MEDRAD MARK 7 150ML (SYRINGE) ×2 IMPLANT
TRANSDUCER W/STOPCOCK (MISCELLANEOUS) ×2 IMPLANT
TUBING CIL FLEX 10 FLL-RA (TUBING) ×2 IMPLANT
WIRE ASAHI PROWATER 180CM (WIRE) ×2 IMPLANT
WIRE EMERALD 3MM-J .035X150CM (WIRE) ×2 IMPLANT

## 2018-12-03 NOTE — Progress Notes (Signed)
PROGRESS NOTE    Priscilla Davis   YQM:[id number]  DOB: 1951/05/06  DOA: 12/01/2018 PCP: Kristen Loader, FNP   Brief Narrative:  Priscilla Davis is a 68 y.o. female with medical history significant of ESRD recently started on hemodialysis through left AV fistula, cardiomyopathy, abnormal nuclear stress test, EF of 40%, hypertension and diabetes , recently quit a smoker who presents to the emergency room with worsening shortness of breath and chest discomfort for 3 weeks.  She was admitted January 2020 with chest tightness and CHF. Echocardiogram showed ejection fraction 35 to 40%, global hypokinesis, grade 1 diastolic dysfunction and moderate left atrial enlargement. Myoview showed basilar inferior, inferolateral, anterolateral nonreversible defect suggestive of infarct but no ischemia. Cath was not done to prevent renal failure. She started HD about 3 wks ago and comes back to the hospital for ongoing episodes of chest pain.    Subjective: No complaints.    Assessment & Plan:   Principal Problem:   Chest pain   SOB (shortness of breath) - appreciate cardiology eval- for cath tomorrow - she is received PRn Nitro for chest pressure in the hospital - cont ASA and statin - cath today>   Ost 2nd Mrg to 2nd Mrg lesion is 100% stenosed.  Ost 3rd Mrg lesion is 100% stenosed.  Prox RCA to Mid RCA lesion is 40% stenosed.  Dist RCA lesion is 95% stenosed.  A drug-eluting stent was successfully placed.  Post intervention, there is a 0% residual stenosis.  There is moderate left ventricular systolic dysfunction.  LV end diastolic pressure is mildly elevated. The left ventricular ejection fraction is 45-50% by visual estimate   Active Problems:   ESRD (end stage renal disease)  - received dialysis yesterday- on TTS schedule asoutpt - has AVF - will have dialysis tomorrow in hospital and if stable, d/c home afterwards   Anemia of chronic disease - cont management per nephrology   Hypotension with h/o HTN - no longer on Norvasc, Coreg- does not need Lasix either   DM2 - cont Levemir (will increase dose today) and ISS - Victoza on hold  Time spent in minutes: 35 DVT prophylaxis: Heparin Code Status: Full code Family Communication: patient alert Disposition Plan: cath tomorrow Consultants:   Nephrology  cardiology Procedures:     Antimicrobials:  Anti-infectives (From admission, onward)   None       Objective: Vitals:   12/03/18 1008 12/03/18 1013 12/03/18 1018 12/03/18 1030  BP: 115/61 120/60 119/61 (!) 125/49  Pulse: 67 88 69   Resp: 12 14 18 16   Temp:      TempSrc:      SpO2: 100% 98% 99% 100%  Weight:      Height:        Intake/Output Summary (Last 24 hours) at 12/03/2018 1052 Last data filed at 12/03/2018 0651 Gross per 24 hour  Intake 298.35 ml  Output -  Net 298.35 ml   Filed Weights   12/01/18 1546 12/01/18 1946 12/02/18 0410  Weight: 88.4 kg 88 kg 87.3 kg    Examination: General exam: Appears comfortable  HEENT: PERRLA, oral mucosa moist, no sclera icterus or thrush Respiratory system: Clear to auscultation. Respiratory effort normal. Cardiovascular system: S1 & S2 heard, RRR.   Gastrointestinal system: Abdomen soft, non-tender, nondistended. Normal bowel sounds. Central nervous system: Alert and oriented. No focal neurological deficits. Extremities: No cyanosis, clubbing or edema Skin: No rashes or ulcers Psychiatry:  Mood & affect appropriate.     Data Reviewed:  I have personally reviewed following labs and imaging studies  CBC: Recent Labs  Lab 12/01/18 1027 12/03/18 0814  WBC 11.5* 9.9  NEUTROABS 9.1*  --   HGB 10.6* 9.5*  HCT 34.6* 30.1*  MCV 90.8 90.7  PLT 289 562   Basic Metabolic Panel: Recent Labs  Lab 12/01/18 1027 12/03/18 0814  NA 137 136  K 4.6 3.3*  CL 98 99  CO2 21* 24  GLUCOSE 226* 214*  BUN 21 30*  CREATININE 5.35* 5.81*  CALCIUM 9.3 9.0   GFR: Estimated Creatinine Clearance:  10.8 mL/min (A) (by C-G formula based on SCr of 5.81 mg/dL (H)). Liver Function Tests: Recent Labs  Lab 12/01/18 1027  AST 37  ALT 26  ALKPHOS 63  BILITOT 1.4*  PROT 7.3  ALBUMIN 3.5   No results for input(s): LIPASE, AMYLASE in the last 168 hours. No results for input(s): AMMONIA in the last 168 hours. Coagulation Profile: No results for input(s): INR, PROTIME in the last 168 hours. Cardiac Enzymes: Recent Labs  Lab 12/01/18 1027 12/01/18 1332 12/01/18 2019 12/02/18 0213  TROPONINI <0.03 <0.03 <0.03 <0.03   BNP (last 3 results) No results for input(s): PROBNP in the last 8760 hours. HbA1C: No results for input(s): HGBA1C in the last 72 hours. CBG: Recent Labs  Lab 12/02/18 0809 12/02/18 1233 12/02/18 1644 12/02/18 2116 12/03/18 0806  GLUCAP 157* 202* 227* 251* 185*   Lipid Profile: No results for input(s): CHOL, HDL, LDLCALC, TRIG, CHOLHDL, LDLDIRECT in the last 72 hours. Thyroid Function Tests: No results for input(s): TSH, T4TOTAL, FREET4, T3FREE, THYROIDAB in the last 72 hours. Anemia Panel: No results for input(s): VITAMINB12, FOLATE, FERRITIN, TIBC, IRON, RETICCTPCT in the last 72 hours. Urine analysis:    Component Value Date/Time   COLORURINE YELLOW 07/26/2017 1000   APPEARANCEUR HAZY (A) 07/26/2017 1000   LABSPEC 1.015 07/26/2017 1000   PHURINE 6.0 07/26/2017 1000   GLUCOSEU NEGATIVE 07/26/2017 1000   HGBUR NEGATIVE 07/26/2017 1000   BILIRUBINUR NEGATIVE 07/26/2017 1000   KETONESUR NEGATIVE 07/26/2017 1000   PROTEINUR 100 (A) 07/26/2017 1000   NITRITE NEGATIVE 07/26/2017 1000   LEUKOCYTESUR TRACE (A) 07/26/2017 1000   Sepsis Labs: @LABRCNTIP (procalcitonin:4,lacticidven:4) )No results found for this or any previous visit (from the past 240 hour(s)).       Radiology Studies: Dg Chest 2 View  Result Date: 12/01/2018 CLINICAL DATA:  Shortness of breath and weakness for 4 days. Recent pneumonia. EXAM: CHEST - 2 VIEW COMPARISON:  09/09/2018  FINDINGS: The heart size and mediastinal contours are within normal limits. There has been resolution of bilateral pulmonary airspace disease since previous study. Both lungs are clear. No evidence of pleural effusion. Mid and lower thoracic degenerative disc disease noted. IMPRESSION: No active cardiopulmonary disease. Electronically Signed   By: Earle Gell M.D.   On: 12/01/2018 10:59      Scheduled Meds: . [MAR Hold] aspirin EC  81 mg Oral Daily  . [MAR Hold] carvedilol  12.5 mg Oral BID WC  . [MAR Hold] Chlorhexidine Gluconate Cloth  6 each Topical Q0600  . Chlorhexidine Gluconate Cloth  6 each Topical Q0600  . [MAR Hold] cholecalciferol  5,000 Units Oral Daily  . [START ON 12/04/2018] darbepoetin (ARANESP) injection - DIALYSIS  40 mcg Intravenous Q Tue-HD  . [MAR Hold] dorzolamide  1 drop Both Eyes BID  . [MAR Hold] FLUoxetine  20 mg Oral Daily  . [MAR Hold] gabapentin  300 mg Oral Daily  . [MAR Hold] heparin  5,000 Units Subcutaneous Q8H  . [MAR Hold] insulin aspart  0-5 Units Subcutaneous QHS  . [MAR Hold] insulin aspart  0-9 Units Subcutaneous TID WC  . [MAR Hold] insulin detemir  10 Units Subcutaneous Daily  . [MAR Hold] latanoprost  1 drop Both Eyes QHS  . [MAR Hold] multivitamin  1 tablet Oral QHS  . [MAR Hold] pantoprazole  40 mg Oral Daily  . [MAR Hold] rosuvastatin  40 mg Oral QHS  . sodium chloride flush  3 mL Intravenous Q12H  . [MAR Hold] vitamin B-12  1,000 mcg Oral Daily   Continuous Infusions: . sodium chloride    . sodium chloride 10 mL/hr at 12/03/18 0118  . sodium chloride    . bivalirudin (ANGIOMAX) infusion 5 mg/mL (Cath Lab,ACS,PCI indication)       LOS: 1 day      Debbe Odea, MD [facility] Pager: [email or web address] Password [facility] 12/03/2018, 10:52 AM

## 2018-12-03 NOTE — Progress Notes (Signed)
Right femoral arterial sheath pulled at 2315. Manual pressure held for 20 minutes, no oozing and site level 0 throughout. Patient tolerated procedure well and reported no nausea or light-headedness. Blood pressures cycled and documented q3 minutes during hold. Rosalio Macadamia RN assisting at bedside. Patient educated before and throughout procedure. Vital signs stable and patient resting comfortably after.  Nelva Bush RN

## 2018-12-03 NOTE — Interval H&P Note (Signed)
Cath Lab Visit (complete for each Cath Lab visit)  Clinical Evaluation Leading to the Procedure:   ACS: Yes.    Non-ACS:    Anginal Classification: CCS III  Anti-ischemic medical therapy: Minimal Therapy (1 class of medications)  Non-Invasive Test Results: No non-invasive testing performed  Prior CABG: No previous CABG      History and Physical Interval Note:  12/03/2018 8:39 AM  Priscilla Davis  has presented today for surgery, with the diagnosis of unstable angina.  The various methods of treatment have been discussed with the patient and family. After consideration of risks, benefits and other options for treatment, the patient has consented to  Procedure(s): LEFT HEART CATH AND CORONARY ANGIOGRAPHY (N/A) as a surgical intervention.  The patient's history has been reviewed, patient examined, no change in status, stable for surgery.  I have reviewed the patient's chart and labs.  Questions were answered to the patient's satisfaction.     Quay Burow

## 2018-12-03 NOTE — Progress Notes (Signed)
Inpatient Diabetes Program Recommendations  AACE/ADA: New Consensus Statement on Inpatient Glycemic Control (2015)  Target Ranges:  Prepandial:   less than 140 mg/dL      Peak postprandial:   less than 180 mg/dL (1-2 hours)      Critically ill patients:  140 - 180 mg/dL   Results for Armato, Marcene (MRN [id number]) as of 12/03/2018 09:39  Ref. Range 12/02/2018 08:09 12/02/2018 12:33 12/02/2018 16:44 12/02/2018 21:16 12/03/2018 08:06  Glucose-Capillary Latest Ref Range: 70 - 99 mg/dL 157 (H) 202 (H) 227 (H) 251 (H) 185 (H)    Review of Glycemic Control  Diabetes history: DM 2 Outpatient Diabetes medications: Levemir 15 units qhs, Victoza 1.8 mg Daily in evening Current orders for Inpatient glycemic control: Levemir 10 units, Novolog 0-9 units tid, Novolog 0-5 units qhs  Inpatient Diabetes Program Recommendations:    Glucose trends elevated. Consider increasing Levemir up to 12-14 units.  Thanks,  Tama Headings RN, MSN, BC-ADM Inpatient Diabetes Coordinator Team Pager [phone] (8a-5p)

## 2018-12-03 NOTE — Progress Notes (Signed)
[facility] ROUNDING NOTE   Subjective:   This is a very pleasant 67 year old lady that was admitted with chest pain.  She is end-stage renal disease with a recent stopped on dialysis her first dialysis treatment was 11/15/2018.  She dialyzes Tuesday Thursday Saturday.  Her plans are for cardiac catheterization 12/03/2018.  Blood pressure 140/70 pulse 71 temperature 97.8 O2 sats 100% 2 L nasal cannula.  Dialysis 12/01/1998 2700 cc removed.  Aspirin 81 mg daily carvedilol 12.5 mg twice daily Crestor 40 mg daily Prozac 20 mg daily insulin sliding scale, Levemir 10 units subcu, Protonix 40 mg daily vitamin B12 1000 mcg daily, Neurontin 300 mg daily, Rena-Vite 1 daily, cholecalciferol 5000 units daily,.  Sodium 136 potassium 3.3 chloride 99 CO2 24 BUN 30 creatinine 5.381 glucose 214 calcium 9.0.  WBC 9.9 hemoglobin 9.5 platelets 256  Objective:  Vital signs in last 24 hours:  Temp:  [97.8 F (36.6 C)-98.8 F (37.1 C)] 97.8 F (36.6 C) (04/06 0426) Pulse Rate:  [70-80] 73 (04/06 0814) Resp:  [15-20] 15 (04/06 0426) BP: (101-142)/(54-77) 142/77 (04/06 0814) SpO2:  [99 %-100 %] 100 % (04/06 0846)  Weight change:  Filed Weights   12/01/18 1546 12/01/18 1946 12/02/18 0410  Weight: 88.4 kg 88 kg 87.3 kg    Intake/Output: I/O last 3 completed shifts: In: 298.4 [P.O.:240; I.V.:58.4] Out: 700 [Other:700]   Intake/Output this shift:  No intake/output data recorded.  CVS- RRR RS- CTA ABD- BS present soft non-distended EXT- no edema   left AV graft with bruit   Basic Metabolic Panel: Recent Labs  Lab 12/01/18 1027  NA 137  K 4.6  CL 98  CO2 21*  GLUCOSE 226*  BUN 21  CREATININE 5.35*  CALCIUM 9.3    Liver Function Tests: Recent Labs  Lab 12/01/18 1027  AST 37  ALT 26  ALKPHOS 63  BILITOT 1.4*  PROT 7.3  ALBUMIN 3.5   No results for input(s): LIPASE, AMYLASE in the last 168 hours. No results for input(s): AMMONIA in the last 168 hours.  CBC: Recent  Labs  Lab 12/01/18 1027 12/03/18 0814  WBC 11.5* 9.9  NEUTROABS 9.1*  --   HGB 10.6* 9.5*  HCT 34.6* 30.1*  MCV 90.8 90.7  PLT 289 256    Cardiac Enzymes: Recent Labs  Lab 12/01/18 1027 12/01/18 1332 12/01/18 2019 12/02/18 0213  TROPONINI <0.03 <0.03 <0.03 <0.03    BNP: Invalid input(s): POCBNP  CBG: Recent Labs  Lab 12/02/18 0809 12/02/18 1233 12/02/18 1644 12/02/18 2116 12/03/18 0806  GLUCAP 157* 202* 227* 251* 185*    Microbiology: Results for orders placed or performed during the hospital encounter of 09/07/18  Blood Culture (routine x 2)     Status: None   Collection Time: 09/07/18  9:10 AM  Result Value Ref Range Status   Specimen Description BLOOD BLOOD RIGHT HAND  Final   Special Requests   Final    BOTTLES DRAWN AEROBIC AND ANAEROBIC Blood Culture adequate volume   Culture   Final    NO GROWTH 5 DAYS Performed at [facility], [place] [address], [place], [place] [address]    Report Status 09/12/2018 FINAL  Final  Blood Culture (routine x 2)     Status: None   Collection Time: 09/07/18  9:15 AM  Result Value Ref Range Status   Specimen Description BLOOD BLOOD RIGHT HAND  Final   Special Requests   Final    BOTTLES DRAWN AEROBIC AND ANAEROBIC  Blood Culture adequate volume   Culture   Final    NO GROWTH 5 DAYS Performed at [facility], [place] [address] [place], [place] [address]    Report Status 09/12/2018 FINAL  Final  Culture, sputum-assessment     Status: None   Collection Time: 09/07/18  3:18 PM  Result Value Ref Range Status   Specimen Description SPU  Final   Special Requests NONE  Final   Sputum evaluation   Final    THIS SPECIMEN IS ACCEPTABLE FOR SPUTUM CULTURE Performed at [facility], [address], [place], [place] [address]    Report Status 09/10/2018 FINAL  Final  Culture, respiratory     Status: None   Collection Time: 09/07/18  3:18 PM  Result Value Ref Range Status   Specimen Description SPU  Final    Special Requests NONE Reflexed from [id number]  Final   Gram Stain   Final    FEW WBC PRESENT, PREDOMINANTLY PMN RARE SQUAMOUS EPITHELIAL CELLS PRESENT FEW GRAM POSITIVE COCCI FEW GRAM NEGATIVE RODS RARE GRAM POSITIVE RODS    Culture   Final    FEW Consistent with normal respiratory flora. Performed at [facility], [place] [address] [place], [place] [address]    Report Status 09/11/2018 FINAL  Final    Coagulation Studies: No results for input(s): LABPROT, INR in the last 72 hours.  Urinalysis: No results for input(s): COLORURINE, LABSPEC, PHURINE, GLUCOSEU, HGBUR, BILIRUBINUR, KETONESUR, PROTEINUR, UROBILINOGEN, NITRITE, LEUKOCYTESUR in the last 72 hours.  Invalid input(s): APPERANCEUR    Imaging: Dg Chest 2 View  Result Date: 12/01/2018 CLINICAL DATA:  Shortness of breath and weakness for 4 days. Recent pneumonia. EXAM: CHEST - 2 VIEW COMPARISON:  09/09/2018 FINDINGS: The heart size and mediastinal contours are within normal limits. There has been resolution of bilateral pulmonary airspace disease since previous study. Both lungs are clear. No evidence of pleural effusion. Mid and lower thoracic degenerative disc disease noted. IMPRESSION: No active cardiopulmonary disease. Electronically Signed   By: Earle Gell M.D.   On: 12/01/2018 10:59     Medications:   . sodium chloride    . sodium chloride 10 mL/hr at 12/03/18 0118   . [MAR Hold] aspirin EC  81 mg Oral Daily  . [MAR Hold] carvedilol  12.5 mg Oral BID WC  . [MAR Hold] Chlorhexidine Gluconate Cloth  6 each Topical Q0600  . [MAR Hold] cholecalciferol  5,000 Units Oral Daily  . [MAR Hold] dorzolamide  1 drop Both Eyes BID  . [MAR Hold] FLUoxetine  20 mg Oral Daily  . [MAR Hold] gabapentin  300 mg Oral Daily  . [MAR Hold] heparin  5,000 Units Subcutaneous Q8H  . [MAR Hold] insulin aspart  0-5 Units Subcutaneous QHS  . [MAR Hold] insulin aspart  0-9 Units Subcutaneous TID WC  . [MAR Hold] insulin detemir  10 Units  Subcutaneous Daily  . [MAR Hold] latanoprost  1 drop Both Eyes QHS  . [MAR Hold] multivitamin  1 tablet Oral QHS  . [MAR Hold] pantoprazole  40 mg Oral Daily  . [MAR Hold] rosuvastatin  40 mg Oral QHS  . sodium chloride flush  3 mL Intravenous Q12H  . [MAR Hold] vitamin B-12  1,000 mcg Oral Daily   sodium chloride, [MAR Hold] acetaminophen, [MAR Hold] albuterol, fentaNYL, midazolam, [MAR Hold]  morphine injection, [MAR Hold] nitroGLYCERIN, sodium chloride flush  Assessment/ Plan:   Chest pain history of cardiomyopathy, plan for cardiac  catheterization 12/03/2018 appreciate assistance from Dr. Gwenlyn Found.  Last 2D echo 09/10/2018 EF 35 to 40%.  Aortic valve sclerotic without stenosis trivial mitral regurgitation.  End-stage renal disease with dialysis Tuesday Thursday Saturday  Hypertension blood pressure much better off amlodipine and Lasix.  Continues on carvedilol 12.5 mg twice daily  Volume appears to be stable with clear lung fields to examination.  Anemia stable slight decrease in hemoglobin we will give ESA and check iron stores  Metabolic bone disease no VDRA at present  Diabetes mellitus as per primary team.   LOS: [place] @TODAY @8 :57 AM

## 2018-12-04 ENCOUNTER — Encounter (HOSPITAL_COMMUNITY): Payer: Self-pay | Admitting: Cardiovascular Disease

## 2018-12-04 DIAGNOSIS — I9589 Other hypotension: Secondary | ICD-10-CM

## 2018-12-04 DIAGNOSIS — N186 End stage renal disease: Secondary | ICD-10-CM

## 2018-12-04 DIAGNOSIS — E861 Hypovolemia: Secondary | ICD-10-CM

## 2018-12-04 DIAGNOSIS — I255 Ischemic cardiomyopathy: Secondary | ICD-10-CM

## 2018-12-04 DIAGNOSIS — E785 Hyperlipidemia, unspecified: Secondary | ICD-10-CM | POA: Diagnosis present

## 2018-12-04 DIAGNOSIS — I25118 Atherosclerotic heart disease of native coronary artery with other forms of angina pectoris: Secondary | ICD-10-CM

## 2018-12-04 DIAGNOSIS — I10 Essential (primary) hypertension: Secondary | ICD-10-CM

## 2018-12-04 LAB — CBC
HCT: 27.1 % — ABNORMAL LOW (ref 36.0–46.0)
Hemoglobin: 8.7 g/dL — ABNORMAL LOW (ref 12.0–15.0)
MCH: 28.8 pg (ref 26.0–34.0)
MCHC: 32.1 g/dL (ref 30.0–36.0)
MCV: 89.7 fL (ref 80.0–100.0)
Platelets: 251 10*3/uL (ref 150–400)
RBC: 3.02 MIL/uL — ABNORMAL LOW (ref 3.87–5.11)
RDW: 14.8 % (ref 11.5–15.5)
WBC: 10.2 10*3/uL (ref 4.0–10.5)
nRBC: 0.3 % — ABNORMAL HIGH (ref 0.0–0.2)

## 2018-12-04 LAB — BASIC METABOLIC PANEL
Anion gap: 11 (ref 5–15)
BUN: 36 mg/dL — ABNORMAL HIGH (ref 8–23)
CO2: 22 mmol/L (ref 22–32)
Calcium: 8.5 mg/dL — ABNORMAL LOW (ref 8.9–10.3)
Chloride: 102 mmol/L (ref 98–111)
Creatinine, Ser: 6.47 mg/dL — ABNORMAL HIGH (ref 0.44–1.00)
GFR calc Af Amer: 7 mL/min — ABNORMAL LOW (ref >60)
GFR calc non Af Amer: 6 mL/min — ABNORMAL LOW (ref >60)
Glucose, Bld: 193 mg/dL — ABNORMAL HIGH (ref 70–99)
Potassium: 3.5 mmol/L (ref 3.5–5.1)
Sodium: 135 mmol/L (ref 135–145)

## 2018-12-04 LAB — GLUCOSE, CAPILLARY
Glucose-Capillary: 158 mg/dL — ABNORMAL HIGH (ref 70–99)
Glucose-Capillary: 183 mg/dL — ABNORMAL HIGH (ref 70–99)
Glucose-Capillary: 229 mg/dL — ABNORMAL HIGH (ref 70–99)
Glucose-Capillary: 202 mg/dL — ABNORMAL HIGH (ref 70–99)

## 2018-12-04 LAB — POCT ACTIVATED CLOTTING TIME
Activated Clotting Time: 191 seconds
Activated Clotting Time: 164 seconds

## 2018-12-04 LAB — MRSA PCR SCREENING: MRSA by PCR: NEGATIVE

## 2018-12-04 MED ORDER — SODIUM CHLORIDE 0.9 % IV BOLUS
500.0000 mL | Freq: Once | INTRAVENOUS | Status: AC
  Administered 2018-12-04: 500 mL via INTRAVENOUS

## 2018-12-04 MED ORDER — LIDOCAINE-PRILOCAINE 2.5-2.5 % EX CREA: 1.0000 "application " | TOPICAL_CREAM | CUTANEOUS | Status: DC | PRN

## 2018-12-04 MED ORDER — SODIUM CHLORIDE 0.9 % IV SOLN: 100.0000 mL | INTRAVENOUS | Status: DC | PRN

## 2018-12-04 MED ORDER — LIDOCAINE HCL (PF) 1 % IJ SOLN: 5.0000 mL | INTRAMUSCULAR | Status: DC | PRN

## 2018-12-04 MED ORDER — ALTEPLASE 2 MG IJ SOLR: 2.0000 mg | Freq: Once | INTRAMUSCULAR | Status: DC | PRN

## 2018-12-04 MED ORDER — PENTAFLUOROPROP-TETRAFLUOROETH EX AERO: 1.0000 "application " | INHALATION_SPRAY | CUTANEOUS | Status: DC | PRN

## 2018-12-04 MED ORDER — CARVEDILOL 6.25 MG PO TABS
6.2500 mg | ORAL_TABLET | Freq: Two times a day (BID) | ORAL | Status: DC
  Administered 2018-12-04 – 2018-12-05 (×2): 6.25 mg via ORAL
  Filled 2018-12-04 (×2): qty 1

## 2018-12-04 MED ORDER — HEPARIN SODIUM (PORCINE) 1000 UNIT/ML DIALYSIS: 1000.0000 [IU] | INTRAMUSCULAR | Status: DC | PRN

## 2018-12-04 MED ORDER — DARBEPOETIN ALFA 40 MCG/0.4ML IJ SOSY
PREFILLED_SYRINGE | INTRAMUSCULAR | Status: AC
  Administered 2018-12-04: 40 ug via INTRAVENOUS
  Filled 2018-12-04: qty 0.4

## 2018-12-04 MED FILL — Heparin Sod (Porcine)-NaCl IV Soln 1000 Unit/500ML-0.9%: INTRAVENOUS | Qty: 500 | Status: AC

## 2018-12-04 NOTE — Progress Notes (Signed)
[facility] ROUNDING NOTE   Subjective:   This is a very pleasant 68 year old lady that was admitted with chest pain.  She is end-stage renal disease with a recent stopped on dialysis her first dialysis treatment was 11/15/2018.  She dialyzes Tuesday Thursday Saturday.  Cardiac catheterization status post drug-eluting stent to the proximal RCA 12/03/2018 appreciate assistance from Dr. Gwenlyn Found  Blood pressure 106/63 pulse 76 temperature 98.6 O2 sats 98% room air.  Dialysis 12/01/1998 2700 cc removed.  Dialysis planned for 12/04/2018  Aspirin 81 mg daily carvedilol 12.5 mg twice daily Crestor 40 mg daily Prozac 20 mg daily insulin sliding scale, Levemir 10 units subcu, Protonix 40 mg daily vitamin B12 1000 mcg daily, Neurontin 300 mg daily, Rena-Vite 1 daily, cholecalciferol 5000 units daily,.  Aranesp 40 mcg 12/04/2018, Brilinta 90 mg daily twice daily  Sodium 135 potassium 3.5 chloride 102 CO2 22 BUN 36 creatinine 6.47 glucose 193 calcium 8.5 iron saturations 21% hemoglobin 8.7 WBC 10.7 platelets 251   Objective:  Vital signs in last 24 hours:  Temp:  [98.1 F (36.7 C)-98.6 F (37 C)] 98.6 F (37 C) (04/07 0403) Pulse Rate:  [64-88] 76 (04/07 0600) Resp:  [7-23] 13 (04/07 0600) BP: (82-167)/(45-77) 106/63 (04/07 0600) SpO2:  [94 %-100 %] 100 % (04/07 0600)  Weight change:  Filed Weights   12/01/18 1546 12/01/18 1946 12/02/18 0410  Weight: 88.4 kg 88 kg 87.3 kg    Intake/Output: I/O last 3 completed shifts: In: 795.5 [P.O.:720; I.V.:75.5] Out: -    Intake/Output this shift:  No intake/output data recorded.  CVS- RRR RS- CTA ABD- BS present soft non-distended EXT- no edema   left AV graft with bruit   Basic Metabolic Panel: Recent Labs  Lab 12/01/18 1027 12/03/18 0814 12/04/18 0306  NA 137 136 135  K 4.6 3.3* 3.5  CL 98 99 102  CO2 21* 24 22  GLUCOSE 226* 214* 193*  BUN 21 30* 36*  CREATININE 5.35* 5.81* 6.47*  CALCIUM 9.3 9.0 8.5*    Liver Function  Tests: Recent Labs  Lab 12/01/18 1027  AST 37  ALT 26  ALKPHOS 63  BILITOT 1.4*  PROT 7.3  ALBUMIN 3.5   No results for input(s): LIPASE, AMYLASE in the last 168 hours. No results for input(s): AMMONIA in the last 168 hours.  CBC: Recent Labs  Lab 12/01/18 1027 12/03/18 0814 12/04/18 0306  WBC 11.5* 9.9 10.2  NEUTROABS 9.1*  --   --   HGB 10.6* 9.5* 8.7*  HCT 34.6* 30.1* 27.1*  MCV 90.8 90.7 89.7  PLT 289 256 251    Cardiac Enzymes: Recent Labs  Lab 12/01/18 1027 12/01/18 1332 12/01/18 2019 12/02/18 0213  TROPONINI <0.03 <0.03 <0.03 <0.03    BNP: Invalid input(s): POCBNP  CBG: Recent Labs  Lab 12/02/18 1644 12/02/18 2116 12/03/18 0806 12/03/18 2109 12/04/18 0602  GLUCAP 227* 251* 185* 126* 183*    Microbiology: Results for orders placed or performed during the hospital encounter of 12/01/18  MRSA PCR Screening     Status: None   Collection Time: 12/03/18  8:50 PM  Result Value Ref Range Status   MRSA by PCR NEGATIVE NEGATIVE Final    Comment:        The GeneXpert MRSA Assay (FDA approved for NASAL specimens only), is one component of a comprehensive MRSA colonization surveillance program. It is not intended to diagnose MRSA infection nor to guide or monitor treatment for MRSA infections. Performed at [facility], [address], [place], [place] [address]     Coagulation Studies: No results for input(s): LABPROT, INR in the last 72 hours.  Urinalysis: No results for input(s): COLORURINE, LABSPEC, PHURINE, GLUCOSEU, HGBUR, BILIRUBINUR, KETONESUR, PROTEINUR, UROBILINOGEN, NITRITE, LEUKOCYTESUR in the last 72 hours.  Invalid input(s): APPERANCEUR    Imaging: No results found.   Medications:   . sodium chloride    . sodium chloride    . sodium chloride     . aspirin EC  81 mg Oral Daily  . carvedilol  12.5 mg Oral BID WC  . Chlorhexidine Gluconate Cloth  6 each Topical Q0600  . cholecalciferol  5,000 Units Oral Daily  .  darbepoetin (ARANESP) injection - DIALYSIS  40 mcg Intravenous Q Tue-HD  . dorzolamide  1 drop Both Eyes BID  . FLUoxetine  20 mg Oral Daily  . gabapentin  300 mg Oral Daily  . heparin  5,000 Units Subcutaneous Q8H  . insulin aspart  0-5 Units Subcutaneous QHS  . insulin aspart  0-9 Units Subcutaneous TID WC  . insulin detemir  14 Units Subcutaneous Daily  . latanoprost  1 drop Both Eyes QHS  . multivitamin  1 tablet Oral QHS  . pantoprazole  40 mg Oral Daily  . rosuvastatin  40 mg Oral QHS  . sodium chloride flush  3 mL Intravenous Q12H  . ticagrelor  90 mg Oral BID  . vitamin B-12  1,000 mcg Oral Daily   sodium chloride, sodium chloride, sodium chloride, acetaminophen, albuterol, alteplase, heparin, lidocaine (PF), lidocaine-prilocaine, morphine injection, nitroGLYCERIN, ondansetron (ZOFRAN) IV, pentafluoroprop-tetrafluoroeth, sodium chloride flush  Assessment/ Plan:   Chest pain history of cardiomyopathy, plan for cardiac catheterization 12/03/2018 appreciate assistance from Dr. Gwenlyn Found with PCI and drug-eluting stent to RCA.  Last 2D echo 09/10/2018 EF 35 to 40%.  Aortic valve sclerotic without stenosis trivial mitral regurgitation.  Patient has been started on Brilinta 90 mg twice daily  End-stage renal disease with dialysis Tuesday Thursday Saturday.  Dialysis planned for 12/04/2018  Hypertension blood pressure much better off amlodipine and Lasix.  Continues on carvedilol 12.5 mg twice daily  Volume appears to be stable with clear lung fields to examination.  Anemia stable slight decrease in hemoglobin, iron saturations appear to be adequate.  She is started on darbepoetin.  Metabolic bone disease no VDRA at present  Diabetes mellitus as per primary team.  Hyperlipidemia continues on Crestor   LOS: 2 Sherril Croon @TODAY @7 :37 AM

## 2018-12-04 NOTE — Progress Notes (Addendum)
Orthostatic BP done per order Lying 143/63  Pulse 85 Sitting EOB 123/73  Pulse 78 Standing 88/51 pulse 88 - pt did c/o being lightheaded and loss of balance when standing.   Primary team, nephrology and cardiology all made aware. No new orders at this time. Will monitor.

## 2018-12-04 NOTE — Progress Notes (Addendum)
Progress Note  Patient Name: Priscilla Davis Date of Encounter: 12/04/2018  Primary Cardiologist: Skeet Latch, MD   Subjective   DOE is better but she is still very worried about the DOE. Her brother works but can help some. Is worried about this as well.  Feels she needs one more hospital day. Feels she could d/c tomorrow.  Inpatient Medications    Scheduled Meds: . aspirin EC  81 mg Oral Daily  . carvedilol  12.5 mg Oral BID WC  . Chlorhexidine Gluconate Cloth  6 each Topical Q0600  . cholecalciferol  5,000 Units Oral Daily  . darbepoetin (ARANESP) injection - DIALYSIS  40 mcg Intravenous Q Tue-HD  . dorzolamide  1 drop Both Eyes BID  . FLUoxetine  20 mg Oral Daily  . gabapentin  300 mg Oral Daily  . heparin  5,000 Units Subcutaneous Q8H  . insulin aspart  0-5 Units Subcutaneous QHS  . insulin aspart  0-9 Units Subcutaneous TID WC  . insulin detemir  14 Units Subcutaneous Daily  . latanoprost  1 drop Both Eyes QHS  . multivitamin  1 tablet Oral QHS  . pantoprazole  40 mg Oral Daily  . rosuvastatin  40 mg Oral QHS  . sodium chloride flush  3 mL Intravenous Q12H  . ticagrelor  90 mg Oral BID  . vitamin B-12  1,000 mcg Oral Daily   Continuous Infusions: . sodium chloride    . sodium chloride    . sodium chloride     PRN Meds: sodium chloride, sodium chloride, sodium chloride, acetaminophen, albuterol, alteplase, heparin, lidocaine (PF), lidocaine-prilocaine, morphine injection, nitroGLYCERIN, ondansetron (ZOFRAN) IV, pentafluoroprop-tetrafluoroeth, sodium chloride flush   Vital Signs    Vitals:   12/04/18 0315 12/04/18 0403 12/04/18 0600 12/04/18 0749  BP: (!) 112/59 111/61 106/63 125/63  Pulse: 75 74 76 80  Resp: 13 11 13 16   Temp:  98.6 F (37 C)  (!) 97.5 F (36.4 C)  TempSrc:  Oral  Oral  SpO2: 99% 98% 100% 100%  Weight:      Height:        Intake/Output Summary (Last 24 hours) at 12/04/2018 0752 Last data filed at 12/04/2018 0000 Gross per 24 hour   Intake 497.16 ml  Output -  Net 497.16 ml   Last 3 Weights 12/02/2018 12/01/2018 12/01/2018  Weight (lbs) 192 lb 7.4 oz 194 lb 0.1 oz 194 lb 14.2 oz  Weight (kg) 87.3 kg 88 kg 88.4 kg      Telemetry    SR, occ PACs- Personally Reviewed  Physical Exam   General: Well developed, well nourished, female in no acute distress Head: Eyes PERRLA, No xanthomas.   Normocephalic and atraumatic Lungs: Clear anteriorly to auscultation. Heart: HRRR S1 S2, without MRG.  Pulses are presentl. No JVD. Abdomen: Bowel sounds are present, abdomen soft and non-tender without masses or  hernias noted. Msk: Normal strength and tone for age. Extremities: No clubbing, cyanosis or edema.  R groin cath site w/out ecchymosis or hematoma. HD per  L forearm Skin:  No rashes or lesions noted. Neuro: Alert and oriented X 3. Psych:  Good affect, responds appropriately  Labs    Chemistry Recent Labs  Lab 12/01/18 1027 12/03/18 0814 12/04/18 0306  NA 137 136 135  K 4.6 3.3* 3.5  CL 98 99 102  CO2 21* 24 22  GLUCOSE 226* 214* 193*  BUN 21 30* 36*  CREATININE 5.35* 5.81* 6.47*  CALCIUM 9.3 9.0 8.5*  PROT  7.3  --   --   ALBUMIN 3.5  --   --   AST 37  --   --   ALT 26  --   --   ALKPHOS 63  --   --   BILITOT 1.4*  --   --   GFRNONAA 8* 7* 6*  GFRAA 9* 8* 7*  ANIONGAP 18* 13 11     Hematology Recent Labs  Lab 12/01/18 1027 12/03/18 0814 12/04/18 0306  WBC 11.5* 9.9 10.2  RBC 3.81* 3.32* 3.02*  HGB 10.6* 9.5* 8.7*  HCT 34.6* 30.1* 27.1*  MCV 90.8 90.7 89.7  MCH 27.8 28.6 28.8  MCHC 30.6 31.6 32.1  RDW 14.0 14.4 14.8  PLT 289 256 251    Cardiac Enzymes Recent Labs  Lab 12/01/18 1027 12/01/18 1332 12/01/18 2019 12/02/18 0213  TROPONINI <0.03 <0.03 <0.03 <0.03    BNP Recent Labs  Lab 12/01/18 1030  BNP 37.0    Cardiology Studies    CARDIAC CATH: 12/03/2018  Ost 2nd Mrg to 2nd Mrg lesion is 100% stenosed.  Ost 3rd Mrg lesion is 100% stenosed.  Prox RCA to Mid RCA lesion is  40% stenosed.  Dist RCA lesion is 95% stenosed.  A drug-eluting stent was successfully placed.  Post intervention, there is a 0% residual stenosis.  There is moderate left ventricular systolic dysfunction.  LV end diastolic pressure is mildly elevated.  The left ventricular ejection fraction is 45-50% by visual estimate. Intervention       Radiology     No results found.  Patient Profile     Priscilla Davis is a 68 y.o. female with a hx of diabetes mellitus, hypertension, end-stage renal disease recently initiated on dialysis who is being seen for the evaluation of dyspnea and chest tightness. Patient was admitted January 2020 with chest tightness and CHF. Echocardiogram showed ejection fraction 35 to 40%, global hypokinesis, grade 1 diastolic dysfunction and moderate left atrial enlargement.  Troponin was elevated but felt to be demand ischemia.  Catheterization was avoided due to risk of contrast nephropathy.  Nuclear study January 2020 showed ejection fraction 44%.  There was a basilar inferior, inferolateral, anterolateral nonreversible defect suggestive of infarct but no ischemia.  Patient recently started on hemodialysis 3 weeks ago.  Dr. Oval Linsey had planned cardiac catheterization electively to evaluate cardiomyopathy once coronavirus pandemic improved.  Patient presented with dyspnea, weakness and chest tightness.  Assessment & Plan    1. Chest tightness- - Recurrent, mildly elevated ez - s/p cath w/ DES RCA, med rx for 100% OM1 & OM2 - Continue ASA, Brilinta, high-dose statin, BB - no ACE/ARB due to renal dz - feels DOE is related to CAD - Dr Claiborne Billings to review data and advise plan.   2. Presumed ischemic cardiomyopathy- -  EF 45-50% at cath +WMA - not on ACE/ARB due to renal dz and borderline BP - on Coreg 12.5 mg bid, but did not get last pm for unclear reasons and did not get this am.  - SBP currently 100s on HD - will change dose 12.5 to 6.25 mg bid - she was  light-headed this am, BP may be dropping - all BP lowering rx d/c'd except the Coreg  3. End-stage renal disease- -  HD on Tu/Th/Sat per renal team  4. Hypertension- - amlodipine d/c'd. - continue Coreg as tolerated   5. Hyperlipidemia- - goal LDL now < 70, continue Crestor 40 mg   For questions or updates, please contact  [facility] Please consult [email or web address] for contact info under        Signed, Rosaria Ferries, PA-C  12/04/2018, 7:52 AM    Patient seen and examined. Agree with assessment and plan.  Patient currently undergoing dialysis.  No chest pain presently.  No JVD.  Lungs clear without wheezing.  Regular rhythm without ectopy.  1/6 systolic murmur.  Right groin catheterization site stable.  No significant edema.  ECG today personally reviewed by me shows normal sinus rhythm at 74 bpm without ST-T abnormalities.  Catheterization data was reviewed with the patient in detail.  She underwent successful stenting of her distal RCA with insertion of a Synergy 2.25 x 12 mm DES stent.  There is evidence for occlusion of small second and third obtuse marginal branches of the circumflex which appeared chronic.  Apparently her carvedilol doses had been held due to somewhat low blood pressure.  Agree with reducing dose to 6.25 mg twice a day from 12.5 twice daily.  Continue aspirin/Brilinta, and high potency statin therapy.  Depending upon blood pressure tolerability can consider low-dose nitrates and/or low-dose amlodipine for additional anti-ischemic benefit. Troy Sine, MD, [facility] 12/04/2018 11:59 AM

## 2018-12-04 NOTE — Progress Notes (Signed)
PROGRESS NOTE    Priscilla Davis   JKK:[id number]  DOB: 1950-09-03  DOA: 12/01/2018 PCP: Priscilla Loader, FNP   Brief Narrative:  Priscilla Davis is a 68 y.o. female with medical history significant of ESRD recently started on hemodialysis through left AV fistula, cardiomyopathy, abnormal nuclear stress test, EF of 40%, hypertension and diabetes , recently quit a smoker who presents to the emergency room with worsening shortness of breath and chest discomfort for 3 weeks.  She was admitted January 2020 with chest tightness and CHF. Echocardiogram showed ejection fraction 35 to 40%, global hypokinesis, grade 1 diastolic dysfunction and moderate left atrial enlargement. Myoview showed basilar inferior, inferolateral, anterolateral nonreversible defect suggestive of infarct but no ischemia. Cath was not done to prevent renal failure. She started HD about 3 wks ago and comes back to the hospital for ongoing episodes of chest pain.    Subjective: She complained of dizziness this AM. After dialysis today she had orthostatic vitals checked and was noted to have a drop in BP with standing along with orthostatic symptoms.     Assessment & Plan:   Principal Problem:   Chest pain   SOB (shortness of breath)  CAD - appreciate cardiology eval-   - cath 4/6 >   Ost 2nd Mrg to 2nd Mrg lesion is 100% stenosed.  Ost 3rd Mrg lesion is 100% stenosed.  Prox RCA to Mid RCA lesion is 40% stenosed.  Dist RCA lesion is 95% stenosed.  A drug-eluting stent was successfully placed.  Post intervention, there is a 0% residual stenosis.  There is moderate left ventricular systolic dysfunction.  LV end diastolic pressure is mildly elevated. The left ventricular ejection fraction is 45-50% by visual estimate - - cont ASA and statin- started on Brillinta  - cont Coreg at reduced dose   Active Problems: Orthostatic hypotension- h/o HTN - holding Lasix & Amlodipine - noted dizziness yesterday and this AM prior  to dialysis  - 1 L removed during dialysis per dialysis RN - I evaluated her in dialysis and ordered orthostatics which were checked after dialysis today and noted to be quite positive - I have ordered a 500 cc NS bolus and asked RN to check orthostatic vitals afterwards- I have notified renal team - Coreg also cut back by cardiology today    ESRD (end stage renal disease)   - on TTS schedule as outpt- received dialysis today - has AVF - plan was for d/c today after dialysis but now orthostatic   Anemia of chronic disease - cont management per nephrology    DM2 - cont Levemir  and ISS - Victoza on hold   Time spent in minutes: 35 DVT prophylaxis: Heparin Code Status: Full code Family Communication: patient alert Disposition Plan: f/u on orthostatics tomorrow Consultants:   Nephrology  cardiology Procedures:     Antimicrobials:  Anti-infectives (From admission, onward)   None       Objective: Vitals:   12/04/18 1115 12/04/18 1130 12/04/18 1200 12/04/18 1215  BP: 137/67 113/61 138/62 140/67  Pulse: 70 71 72 76  Resp:    16  Temp:    97.8 F (36.6 C)  TempSrc:    Oral  SpO2:    98%  Weight:    88.6 kg  Height:        Intake/Output Summary (Last 24 hours) at 12/04/2018 1458 Last data filed at 12/04/2018 1215 Gross per 24 hour  Intake 737.16 ml  Output 881 ml  Net -143.84  ml   Filed Weights   12/02/18 0410 12/04/18 0810 12/04/18 1215  Weight: 87.3 kg 89.9 kg 88.6 kg    Examination: General exam: Appears comfortable  HEENT: PERRLA, oral mucosa moist, no sclera icterus or thrush Respiratory system: Clear to auscultation. Respiratory effort normal. Cardiovascular system: S1 & S2 heard,  No murmurs  Gastrointestinal system: Abdomen soft, non-tender, nondistended. Normal bowel sounds   Central nervous system: Alert and oriented. No focal neurological deficits. Extremities: No cyanosis, clubbing or edema Skin: No rashes or ulcers Psychiatry:  Mood & affect  appropriate.     Data Reviewed: I have personally reviewed following labs and imaging studies  CBC: Recent Labs  Lab 12/01/18 1027 12/03/18 0814 12/04/18 0306  WBC 11.5* 9.9 10.2  NEUTROABS 9.1*  --   --   HGB 10.6* 9.5* 8.7*  HCT 34.6* 30.1* 27.1*  MCV 90.8 90.7 89.7  PLT 289 256 621   Basic Metabolic Panel: Recent Labs  Lab 12/01/18 1027 12/03/18 0814 12/04/18 0306  NA 137 136 135  K 4.6 3.3* 3.5  CL 98 99 102  CO2 21* 24 22  GLUCOSE 226* 214* 193*  BUN 21 30* 36*  CREATININE 5.35* 5.81* 6.47*  CALCIUM 9.3 9.0 8.5*   GFR: Estimated Creatinine Clearance: 9.7 mL/min (A) (by C-G formula based on SCr of 6.47 mg/dL (H)). Liver Function Tests: Recent Labs  Lab 12/01/18 1027  AST 37  ALT 26  ALKPHOS 63  BILITOT 1.4*  PROT 7.3  ALBUMIN 3.5   No results for input(s): LIPASE, AMYLASE in the last 168 hours. No results for input(s): AMMONIA in the last 168 hours. Coagulation Profile: No results for input(s): INR, PROTIME in the last 168 hours. Cardiac Enzymes: Recent Labs  Lab 12/01/18 1027 12/01/18 1332 12/01/18 2019 12/02/18 0213  TROPONINI <0.03 <0.03 <0.03 <0.03   BNP (last 3 results) No results for input(s): PROBNP in the last 8760 hours. HbA1C: No results for input(s): HGBA1C in the last 72 hours. CBG: Recent Labs  Lab 12/02/18 2116 12/03/18 0806 12/03/18 1048 12/03/18 2109 12/04/18 0602  GLUCAP 251* 185* 158* 126* 183*   Lipid Profile: No results for input(s): CHOL, HDL, LDLCALC, TRIG, CHOLHDL, LDLDIRECT in the last 72 hours. Thyroid Function Tests: No results for input(s): TSH, T4TOTAL, FREET4, T3FREE, THYROIDAB in the last 72 hours. Anemia Panel: Recent Labs    12/03/18 1658  TIBC 185*  IRON 39   Urine analysis:    Component Value Date/Time   COLORURINE YELLOW 07/26/2017 1000   APPEARANCEUR HAZY (A) 07/26/2017 1000   LABSPEC 1.015 07/26/2017 1000   PHURINE 6.0 07/26/2017 1000   GLUCOSEU NEGATIVE 07/26/2017 1000   HGBUR  NEGATIVE 07/26/2017 1000   BILIRUBINUR NEGATIVE 07/26/2017 1000   KETONESUR NEGATIVE 07/26/2017 1000   PROTEINUR 100 (A) 07/26/2017 1000   NITRITE NEGATIVE 07/26/2017 1000   LEUKOCYTESUR TRACE (A) 07/26/2017 1000   Sepsis Labs: @LABRCNTIP (procalcitonin:4,lacticidven:4) ) Recent Results (from the past 240 hour(s))  MRSA PCR Screening     Status: None   Collection Time: 12/03/18  8:50 PM  Result Value Ref Range Status   MRSA by PCR NEGATIVE NEGATIVE Final    Comment:        The GeneXpert MRSA Assay (FDA approved for NASAL specimens only), is one component of a comprehensive MRSA colonization surveillance program. It is not intended to diagnose MRSA infection nor to guide or monitor treatment for MRSA infections. Performed at [facility], [place] [address], [place], [place]  [place]          Radiology Studies: No results found.    Scheduled Meds: . aspirin EC  81 mg Oral Daily  . carvedilol  6.25 mg Oral BID WC  . Chlorhexidine Gluconate Cloth  6 each Topical Q0600  . cholecalciferol  5,000 Units Oral Daily  . darbepoetin (ARANESP) injection - DIALYSIS  40 mcg Intravenous Q Tue-HD  . dorzolamide  1 drop Both Eyes BID  . FLUoxetine  20 mg Oral Daily  . gabapentin  300 mg Oral Daily  . heparin  5,000 Units Subcutaneous Q8H  . insulin aspart  0-5 Units Subcutaneous QHS  . insulin aspart  0-9 Units Subcutaneous TID WC  . insulin detemir  14 Units Subcutaneous Daily  . latanoprost  1 drop Both Eyes QHS  . multivitamin  1 tablet Oral QHS  . pantoprazole  40 mg Oral Daily  . rosuvastatin  40 mg Oral QHS  . sodium chloride flush  3 mL Intravenous Q12H  . ticagrelor  90 mg Oral BID  . vitamin B-12  1,000 mcg Oral Daily   Continuous Infusions: . sodium chloride       LOS: 2 days      Debbe Odea, MD [facility] Pager: [email or web address] Password [facility] 12/04/2018, 2:58 PM

## 2018-12-05 DIAGNOSIS — I951 Orthostatic hypotension: Secondary | ICD-10-CM

## 2018-12-05 LAB — GLUCOSE, CAPILLARY
Glucose-Capillary: 229 mg/dL — ABNORMAL HIGH (ref 70–99)
Glucose-Capillary: 193 mg/dL — ABNORMAL HIGH (ref 70–99)
Glucose-Capillary: 300 mg/dL — ABNORMAL HIGH (ref 70–99)
Glucose-Capillary: 258 mg/dL — ABNORMAL HIGH (ref 70–99)

## 2018-12-05 MED ORDER — CHLORHEXIDINE GLUCONATE CLOTH 2 % EX PADS
6.0000 | MEDICATED_PAD | Freq: Every day | CUTANEOUS | Status: DC
  Administered 2018-12-05: 6 via TOPICAL

## 2018-12-05 MED ORDER — SODIUM CHLORIDE 0.9 % IV BOLUS
500.0000 mL | Freq: Once | INTRAVENOUS | Status: AC
  Administered 2018-12-05: 11:00:00 via INTRAVENOUS

## 2018-12-05 NOTE — Plan of Care (Signed)

## 2018-12-05 NOTE — Progress Notes (Signed)
[facility] ROUNDING NOTE   Subjective:   This is a very pleasant 68 year old lady that was admitted with chest pain.  She is end-stage renal disease with a recent stopped on dialysis her first dialysis treatment was 11/15/2018.  She dialyzes Tuesday Thursday Saturday.  Cardiac catheterization status post drug-eluting stent to the proximal RCA 12/03/2018 appreciate assistance from Dr. Gwenlyn Found  Blood pressure 122/69 pulse 71 temperature 97.8 O2 sats 99% room air   dialysis with removal of 800 cc  Aspirin 81 mg daily carvedilol 6.25 mg twice daily Crestor 40 mg daily Prozac 20 mg daily insulin sliding scale, Levemir 10 units subcu, Protonix 40 mg daily vitamin B12 1000 mcg daily, Neurontin 300 mg daily, Rena-Vite 1 daily, cholecalciferol 5000 units daily,.  Aranesp 40 mcg 12/04/2018, Brilinta 90 mg daily twice daily  No labs this morning   Objective:  Vital signs in last 24 hours:  Temp:  [97.5 F (36.4 C)-98.8 F (37.1 C)] 98 F (36.7 C) (04/08 0353) Pulse Rate:  [14-81] 77 (04/08 0353) Resp:  [11-22] 12 (04/08 0353) BP: (105-141)/(46-72) 140/72 (04/08 0353) SpO2:  [97 %-100 %] 100 % (04/08 0353) Weight:  [88.6 kg-89.9 kg] 88.6 kg (04/07 1215)  Weight change:  Filed Weights   12/02/18 0410 12/04/18 0810 12/04/18 1215  Weight: 87.3 kg 89.9 kg 88.6 kg    Intake/Output: I/O last 3 completed shifts: In: 737.2 [P.O.:720; I.V.:17.2] Out: 881 [Other:881]   Intake/Output this shift:  No intake/output data recorded.  CVS- RRR RS- CTA ABD- BS present soft non-distended EXT- no edema   left AV graft with bruit   Basic Metabolic Panel: Recent Labs  Lab 12/01/18 1027 12/03/18 0814 12/04/18 0306  NA 137 136 135  K 4.6 3.3* 3.5  CL 98 99 102  CO2 21* 24 22  GLUCOSE 226* 214* 193*  BUN 21 30* 36*  CREATININE 5.35* 5.81* 6.47*  CALCIUM 9.3 9.0 8.5*    Liver Function Tests: Recent Labs  Lab 12/01/18 1027  AST 37  ALT 26  ALKPHOS 63  BILITOT 1.4*  PROT 7.3   ALBUMIN 3.5   No results for input(s): LIPASE, AMYLASE in the last 168 hours. No results for input(s): AMMONIA in the last 168 hours.  CBC: Recent Labs  Lab 12/01/18 1027 12/03/18 0814 12/04/18 0306  WBC 11.5* 9.9 10.2  NEUTROABS 9.1*  --   --   HGB 10.6* 9.5* 8.7*  HCT 34.6* 30.1* 27.1*  MCV 90.8 90.7 89.7  PLT 289 256 251    Cardiac Enzymes: Recent Labs  Lab 12/01/18 1027 12/01/18 1332 12/01/18 2019 12/02/18 0213  TROPONINI <0.03 <0.03 <0.03 <0.03    BNP: Invalid input(s): POCBNP  CBG: Recent Labs  Lab 12/03/18 2109 12/04/18 0602 12/04/18 1646 12/04/18 2158 12/05/18 0634  GLUCAP 126* 183* 229* 202* 229*    Microbiology: Results for orders placed or performed during the hospital encounter of 12/01/18  MRSA PCR Screening     Status: None   Collection Time: 12/03/18  8:50 PM  Result Value Ref Range Status   MRSA by PCR NEGATIVE NEGATIVE Final    Comment:        The GeneXpert MRSA Assay (FDA approved for NASAL specimens only), is one component of a comprehensive MRSA colonization surveillance program. It is not intended to diagnose MRSA infection nor to guide or monitor treatment for MRSA infections. Performed at [facility], [place] [address] [place], [place] [address]     Coagulation Studies: No  results for input(s): LABPROT, INR in the last 72 hours.  Urinalysis: No results for input(s): COLORURINE, LABSPEC, PHURINE, GLUCOSEU, HGBUR, BILIRUBINUR, KETONESUR, PROTEINUR, UROBILINOGEN, NITRITE, LEUKOCYTESUR in the last 72 hours.  Invalid input(s): APPERANCEUR    Imaging: No results found.   Medications:   . sodium chloride     . aspirin EC  81 mg Oral Daily  . carvedilol  6.25 mg Oral BID WC  . Chlorhexidine Gluconate Cloth  6 each Topical Q0600  . cholecalciferol  5,000 Units Oral Daily  . darbepoetin (ARANESP) injection - DIALYSIS  40 mcg Intravenous Q Tue-HD  . dorzolamide  1 drop Both Eyes BID  . FLUoxetine  20 mg Oral  Daily  . gabapentin  300 mg Oral Daily  . heparin  5,000 Units Subcutaneous Q8H  . insulin aspart  0-5 Units Subcutaneous QHS  . insulin aspart  0-9 Units Subcutaneous TID WC  . insulin detemir  14 Units Subcutaneous Daily  . latanoprost  1 drop Both Eyes QHS  . multivitamin  1 tablet Oral QHS  . pantoprazole  40 mg Oral Daily  . rosuvastatin  40 mg Oral QHS  . sodium chloride flush  3 mL Intravenous Q12H  . ticagrelor  90 mg Oral BID  . vitamin B-12  1,000 mcg Oral Daily   sodium chloride, acetaminophen, albuterol, nitroGLYCERIN, ondansetron (ZOFRAN) IV, sodium chloride flush  Assessment/ Plan:   Chest pain history of cardiomyopathy, plan for cardiac catheterization 12/03/2018 appreciate assistance from Dr. Gwenlyn Found with PCI and drug-eluting stent to RCA.  Last 2D echo 09/10/2018 EF 35 to 40%.  Aortic valve sclerotic without stenosis trivial mitral regurgitation.  Patient has been started on Brilinta 90 mg twice daily  End-stage renal disease with dialysis Tuesday Thursday Saturday.  Dialysis planned for 12/06/2018  Hypertension blood pressure much better off amlodipine and Lasix.  Continues on carvedilol 12.5 mg twice daily  Volume appears to be stable with clear lung fields to examination.  Anemia stable slight decrease in hemoglobin, iron saturations appear to be adequate.  She is started on darbepoetin.  Metabolic bone disease no VDRA at present  Diabetes mellitus as per primary team.  Hyperlipidemia continues on Crestor   LOS: [place] @TODAY @7 :32 AM

## 2018-12-05 NOTE — Evaluation (Signed)
Physical Therapy Evaluation Patient Details Name: Priscilla Davis MRN: [id number] DOB: [birth year] Today's Date: 12/05/2018   History of Present Illness  Priscilla Davis is a 68 y.o. female with medical history significant of ESRD recently started on hemodialysis through left AV fistula, cardiomyopathy, abnormal nuclear stress test, EF of 40%, hypertension and diabetes , recently quit a smoker who presents to the emergency room with worsening shortness of breath and chest discomfort for 3 weeks.  Patient had recent hospitalization, abnormal nuclear scan test, was seen outpatient and scheduled for cardiac cath, completed 4/6.  Clinical Impression  Pt admitted with/for [place] s/p cath.  Pt is at a light min assist level at this time, but expect steady improvement..  Pt currently limited functionally due to the problems listed below.  (see problems list.)  Pt will benefit from PT to maximize function and safety to be able to get home safely with available assist.     Follow Up Recommendations Home health PT;Supervision - Intermittent    Equipment Recommendations  None recommended by PT    Recommendations for Other Services       Precautions / Restrictions Precautions Precautions: Fall      Mobility  Bed Mobility               General bed mobility comments: OOB on arrival  Transfers Overall transfer level: Needs assistance   Transfers: Sit to/from Stand Sit to Stand: Min assist         General transfer comment: stability assist  Ambulation/Gait Ambulation/Gait assistance: Min assist Gait Distance (Feet): 60 Feet Assistive device: Straight cane Gait Pattern/deviations: Step-through pattern Gait velocity: slower Gait velocity interpretation: <1.31 ft/sec, indicative of household ambulator General Gait Details: mildly unsteady, retropulsive, with adequate use of the std cane.  Stairs            Wheelchair Mobility    Modified Rankin (Stroke Patients Only)        Balance Overall balance assessment: Needs assistance   Sitting balance-Leahy Scale: Fair     Standing balance support: No upper extremity supported;Single extremity supported Standing balance-Leahy Scale: Fair                   Standardized Balance Assessment Standardized Balance Assessment : PASS           Pertinent Vitals/Pain Pain Assessment: Faces Faces Pain Scale: No hurt    Home Living Family/patient expects to be discharged to:: Private residence Living Arrangements: Other relatives(sister, brother in law and multiple children) Available Help at Discharge: Family;Available PRN/intermittently Type of Home: House Home Access: Stairs to enter Entrance Stairs-Rails: Can reach both Entrance Stairs-Number of Steps: 6 Home Layout: Multi-level Home Equipment: Cane - single point      Prior Function Level of Independence: Independent with assistive device(s)         Comments: cane outside, no device for short distances      Hand Dominance        Extremity/Trunk Assessment        Lower Extremity Assessment Lower Extremity Assessment: Overall WFL for tasks assessed(weak hip flexor)       Communication   Communication: No difficulties  Cognition Arousal/Alertness: Awake/alert Behavior During Therapy: WFL for tasks assessed/performed Overall Cognitive Status: Within Functional Limits for tasks assessed  General Comments      Exercises     Assessment/Plan    PT Assessment Patient needs continued PT services  PT Problem List Decreased balance;Decreased mobility;Decreased strength;Decreased activity tolerance       PT Treatment Interventions DME instruction;Functional mobility training;Therapeutic activities    PT Goals (Current goals can be found in the Care Plan section)  Acute Rehab PT Goals Patient Stated Goal: ultimately home independent PT Goal Formulation: With patient Time  For Goal Achievement: 12/19/18 Potential to Achieve Goals: Good    Frequency Min 3X/week   Barriers to discharge        Co-evaluation               AM-PAC PT "6 Clicks" Mobility  Outcome Measure Help needed turning from your back to your side while in a flat bed without using bedrails?: None Help needed moving from lying on your back to sitting on the side of a flat bed without using bedrails?: None Help needed moving to and from a bed to a chair (including a wheelchair)?: A Little Help needed standing up from a chair using your arms (e.g., wheelchair or bedside chair)?: A Little Help needed to walk in hospital room?: A Little Help needed climbing 3-5 steps with a railing? : A Little 6 Click Score: 20    End of Session   Activity Tolerance: Patient tolerated treatment well Patient left: in chair;with call bell/phone within reach Nurse Communication: Mobility status PT Visit Diagnosis: Unsteadiness on feet (R26.81);Muscle weakness (generalized) (M62.81);Difficulty in walking, not elsewhere classified (R26.2)    Time: 3614-4315 PT Time Calculation (min) (ACUTE ONLY): 23 min   Charges:   PT Evaluation $PT Eval Moderate Complexity: 1 Mod PT Treatments $Gait Training: 8-22 mins        12/05/2018  Priscilla Davis, PT Acute Rehabilitation Services [phone]  (pager) [phone]  (office)  Priscilla Davis Priscilla Davis 12/05/2018, 5:52 PM

## 2018-12-05 NOTE — Progress Notes (Signed)
Progress Note  Patient Name: Priscilla Davis Date of Encounter: 12/05/2018  Primary Cardiologist: Skeet Latch, MD   Subjective   No chest pian today  Inpatient Medications    Scheduled Meds: . aspirin EC  81 mg Oral Daily  . carvedilol  6.25 mg Oral BID WC  . Chlorhexidine Gluconate Cloth  6 each Topical Q0600  . cholecalciferol  5,000 Units Oral Daily  . darbepoetin (ARANESP) injection - DIALYSIS  40 mcg Intravenous Q Tue-HD  . dorzolamide  1 drop Both Eyes BID  . FLUoxetine  20 mg Oral Daily  . gabapentin  300 mg Oral Daily  . heparin  5,000 Units Subcutaneous Q8H  . insulin aspart  0-5 Units Subcutaneous QHS  . insulin aspart  0-9 Units Subcutaneous TID WC  . insulin detemir  14 Units Subcutaneous Daily  . latanoprost  1 drop Both Eyes QHS  . multivitamin  1 tablet Oral QHS  . pantoprazole  40 mg Oral Daily  . rosuvastatin  40 mg Oral QHS  . sodium chloride flush  3 mL Intravenous Q12H  . ticagrelor  90 mg Oral BID  . vitamin B-12  1,000 mcg Oral Daily   Continuous Infusions: . sodium chloride     PRN Meds: sodium chloride, acetaminophen, albuterol, nitroGLYCERIN, ondansetron (ZOFRAN) IV, sodium chloride flush   Vital Signs    Vitals:   12/04/18 1749 12/04/18 2000 12/04/18 2332 12/05/18 0353  BP: (!) 115/46 (!) 122/51 119/63 140/72  Pulse: 81 (!) 14 80 77  Resp: 11 13 (!) 22 12  Temp: 98.8 F (37.1 C) 98.5 F (36.9 C) 98.4 F (36.9 C) 98 F (36.7 C)  TempSrc: Oral Oral Oral Oral  SpO2: 100% 97% 98% 100%  Weight:      Height:        Intake/Output Summary (Last 24 hours) at 12/05/2018 0720 Last data filed at 12/04/2018 1752 Gross per 24 hour  Intake 480 ml  Output 881 ml  Net -401 ml   Last 3 Weights 12/04/2018 12/04/2018 12/02/2018  Weight (lbs) 195 lb 5.2 oz 198 lb 3.1 oz 192 lb 7.4 oz  Weight (kg) 88.6 kg 89.9 kg 87.3 kg      Telemetry    Sinus in the 70s - Personally Reviewed  ECG    NSR 74; ILBB- Personally Reviewed  Physical Exam   GEN:  No acute distress.   Neck: No JVD Cardiac: RRR, no murmurs, rubs, or gallops.  Respiratory: Clear to auscultation bilaterally. GI: Soft, nontender, non-distended  MS: No edema; No deformity. Neuro:  Nonfocal  Psych: Normal affect   Labs    Chemistry Recent Labs  Lab 12/01/18 1027 12/03/18 0814 12/04/18 0306  NA 137 136 135  K 4.6 3.3* 3.5  CL 98 99 102  CO2 21* 24 22  GLUCOSE 226* 214* 193*  BUN 21 30* 36*  CREATININE 5.35* 5.81* 6.47*  CALCIUM 9.3 9.0 8.5*  PROT 7.3  --   --   ALBUMIN 3.5  --   --   AST 37  --   --   ALT 26  --   --   ALKPHOS 63  --   --   BILITOT 1.4*  --   --   GFRNONAA 8* 7* 6*  GFRAA 9* 8* 7*  ANIONGAP 18* 13 11     Hematology Recent Labs  Lab 12/01/18 1027 12/03/18 0814 12/04/18 0306  WBC 11.5* 9.9 10.2  RBC 3.81* 3.32* 3.02*  HGB 10.6*  9.5* 8.7*  HCT 34.6* 30.1* 27.1*  MCV 90.8 90.7 89.7  MCH 27.8 28.6 28.8  MCHC 30.6 31.6 32.1  RDW 14.0 14.4 14.8  PLT 289 256 251    Cardiac Enzymes Recent Labs  Lab 12/01/18 1027 12/01/18 1332 12/01/18 2019 12/02/18 0213  TROPONINI <0.03 <0.03 <0.03 <0.03   No results for input(s): TROPIPOC in the last 168 hours.   BNP Recent Labs  Lab 12/01/18 1030  BNP 37.0     DDimer No results for input(s): DDIMER in the last 168 hours.   Radiology    No results found.  Cardiac Studies   CARDIAC CATH: 12/03/2018   Ost 2nd Mrg to 2nd Mrg lesion is 100% stenosed.  Ost 3rd Mrg lesion is 100% stenosed.  Prox RCA to Mid RCA lesion is 40% stenosed.  Dist RCA lesion is 95% stenosed.  A drug-eluting stent was successfully placed.  Post intervention, there is a 0% residual stenosis.  There is moderate left ventricular systolic dysfunction.  LV end diastolic pressure is mildly elevated.  The left ventricular ejection fraction is 45-50% by visual estimate.       Intervention     Patient Profile     68 y.o. female with a hx of diabetes mellitus, hypertension, end-stage renal  disease recently initiated on dialysiswho is being seen for the evaluation of dyspnea and chest tightness. Patient was admitted January 2020 with chest tightness and CHF. Echocardiogram showed ejection fraction 35 to 40%, global hypokinesis, grade 1 diastolic dysfunction and moderate left atrial enlargement. Troponin was elevated but felt to be demand ischemia. Catheterization was avoided due to risk of contrast nephropathy. Nuclear study January 2020 showed ejection fraction 44%. There was a basilar inferior, inferolateral, anterolateral nonreversible defect suggestive of infarct but no ischemia. Patient recently started on hemodialysis 3 weeks ago. Dr. Oval Linsey had planned cardiac catheterization electively to evaluate cardiomyopathy once coronavirus pandemic improved.  Patient presented with dyspnea, weakness and chest tightness.  Assessment & Plan    1. Chest tightness: s/p cath w/ DES RCA, med rx for 100% OM1 & OM2. EF 45-50% via LV gram. -- Continue ASA, Brilinta, high-dose statin, BB now held in the setting of lightheadedness and hypotension. -- no room for ACE/ARB due to renal dz  2. 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 No ACE/ARB due to renal dz and borderline BP. -- coreg reduced to 6.25mg  BID yesterday as blood pressures have been soft. Still getting lightheaded, BB stopped by primary team. -- volume management via HD  3. ESRD on HD: -- HD on Tu/Th/Sat per renal team  4. Hypertension: actually been hypotensive with blood pressure medications held with exception of coreg -- now held today.  5. Hyperlipidemia: -- goal LDL now < 70, continue Crestor 40 mg    For questions or updates, please contact [place] Please consult [email or web address] for contact info under        Signed, Reino Bellis, NP  12/05/2018, 7:20 AM     Patient seen and examined. Agree with assessment and plan. Orthostatic BP noted yesterday post dialysis ane this am with supine  140/67, sitting 122/69 and standing 96.42.  Apparently coreg was dc'd. Consider a trial of support stockings with 20 - 30 mm Hg support.  Now not any anti-ischemic meds.  Pt has occlusion of 2 marginal branches of Lcx; s/p DES stent to distal RCA.  If recurrent anginal symptoms may need resumption of low dose BB.   Troy Sine, MD,  [facility] 12/05/2018 3:16 PM

## 2018-12-05 NOTE — Progress Notes (Signed)
PROGRESS NOTE    Priscilla Davis   DZH:[id number]  DOB: [birth year]  DOA: 12/01/2018 PCP: Kristen Loader, FNP   Brief Narrative:  Priscilla Davis is a 68 y.o. female with medical history significant of ESRD recently started on hemodialysis through left AV fistula, cardiomyopathy, abnormal nuclear stress test, EF of 40%, hypertension and diabetes , recently quit a smoker who presents to the emergency room with worsening shortness of breath and chest discomfort for 3 weeks.  She was admitted January 2020 with chest tightness and CHF. Echocardiogram showed ejection fraction 35 to 40%, global hypokinesis, grade 1 diastolic dysfunction and moderate left atrial enlargement. Myoview showed basilar inferior, inferolateral, anterolateral nonreversible defect suggestive of infarct but no ischemia. Cath was not done to prevent renal failure. She started HD about 3 wks ago and comes back to the hospital for ongoing episodes of chest pain.    Subjective: still "tired" getting around and lightheaded / "woozy" with standing. Has not been getting up by herself.  No SOB or leg swelling.      Assessment & Plan:   Principal Problem:   Chest pain   SOB (shortness of breath)  CAD - appreciate cardiology eval-   - cath 4/6 >   Ost 2nd Mrg to 2nd Mrg lesion is 100% stenosed.  Ost 3rd Mrg lesion is 100% stenosed.  Prox RCA to Mid RCA lesion is 40% stenosed.  Dist RCA lesion is 95% stenosed.  A drug-eluting stent was successfully placed.  Post intervention, there is a 0% residual stenosis.  There is moderate left ventricular systolic dysfunction.  LV end diastolic pressure is mildly elevated. The left ventricular ejection fraction is 45-50% by visual estimate - - cont ASA and statin- started on Brillinta  - dc Coreg , see below   Active Problems: Orthostatic hypotension- h/o HTN - stood to stand today w/ immediate BP drop into high 80's then BP's stabilized around 100/ 50, minimally symptomatic. No vol  excess on exam, wt's stable - have d/w renal , will dc coreg and give 500 cc bolus, try not to pull fluid on next HD, see if these measures help. If not, she could have autonomic neuropathy w/ long-standing DM in which we could try midodrine, but will hold off on this for now - will consult PT for their input    ESRD (end stage renal disease)   - on TTS schedule as outpt- HD tomorrow per renal - has AVF    Anemia of chronic disease - cont management per nephrology    DM2 - cont Levemir  and ISS - Victoza on hold   DVT prophylaxis: Heparin Code Status: Full code Family Communication: patient alert Disposition Plan: not ready for dc yet    Rob Doctor, hospital / [facility] [phone] 12/05/2018, 10:27 AM      Consultants:   Nephrology  cardiology Procedures:     Antimicrobials:  Anti-infectives (From admission, onward)   None       Objective: Vitals:   12/05/18 0353 12/05/18 0731 12/05/18 0838 12/05/18 0843  BP: 140/72 122/69 (!) 96/42 112/74  Pulse: 77 71 98   Resp: 12 13 14 20   Temp: 98 F (36.7 C) 97.8 F (36.6 C)    TempSrc: Oral Oral    SpO2: 100% 99% 99%   Weight:      Height:        Intake/Output Summary (Last 24 hours) at 12/05/2018 1019 Last data filed at 12/05/2018 1000 Gross per 24 hour  Intake  480 ml  Output 881 ml  Net -401 ml   Filed Weights   12/02/18 0410 12/04/18 0810 12/04/18 1215  Weight: 87.3 kg 89.9 kg 88.6 kg    Examination: General exam: Appears comfortable  HEENT: PERRLA, oral mucosa moist, no sclera icterus or thrush Respiratory system: Clear to auscultation. Respiratory effort normal. Cardiovascular system: S1 & S2 heard,  No murmurs  Gastrointestinal system: Abdomen soft, non-tender, nondistended. Normal bowel sounds   Central nervous system: Alert and oriented. No focal neurological deficits. Extremities: No cyanosis, clubbing or edema Skin: No rashes or ulcers Psychiatry:  Mood & affect appropriate.     Data Reviewed: I have  personally reviewed following labs and imaging studies  CBC: Recent Labs  Lab 12/01/18 1027 12/03/18 0814 12/04/18 0306  WBC 11.5* 9.9 10.2  NEUTROABS 9.1*  --   --   HGB 10.6* 9.5* 8.7*  HCT 34.6* 30.1* 27.1*  MCV 90.8 90.7 89.7  PLT 289 256 629   Basic Metabolic Panel: Recent Labs  Lab 12/01/18 1027 12/03/18 0814 12/04/18 0306  NA 137 136 135  K 4.6 3.3* 3.5  CL 98 99 102  CO2 21* 24 22  GLUCOSE 226* 214* 193*  BUN 21 30* 36*  CREATININE 5.35* 5.81* 6.47*  CALCIUM 9.3 9.0 8.5*   GFR: Estimated Creatinine Clearance: 9.7 mL/min (A) (by C-G formula based on SCr of 6.47 mg/dL (H)). Liver Function Tests: Recent Labs  Lab 12/01/18 1027  AST 37  ALT 26  ALKPHOS 63  BILITOT 1.4*  PROT 7.3  ALBUMIN 3.5   No results for input(s): LIPASE, AMYLASE in the last 168 hours. No results for input(s): AMMONIA in the last 168 hours. Coagulation Profile: No results for input(s): INR, PROTIME in the last 168 hours. Cardiac Enzymes: Recent Labs  Lab 12/01/18 1027 12/01/18 1332 12/01/18 2019 12/02/18 0213  TROPONINI <0.03 <0.03 <0.03 <0.03   BNP (last 3 results) No results for input(s): PROBNP in the last 8760 hours. HbA1C: No results for input(s): HGBA1C in the last 72 hours. CBG: Recent Labs  Lab 12/03/18 2109 12/04/18 0602 12/04/18 1646 12/04/18 2158 12/05/18 0634  GLUCAP 126* 183* 229* 202* 229*   Lipid Profile: No results for input(s): CHOL, HDL, LDLCALC, TRIG, CHOLHDL, LDLDIRECT in the last 72 hours. Thyroid Function Tests: No results for input(s): TSH, T4TOTAL, FREET4, T3FREE, THYROIDAB in the last 72 hours. Anemia Panel: Recent Labs    12/03/18 1658  TIBC 185*  IRON 39   Urine analysis:    Component Value Date/Time   COLORURINE YELLOW 07/26/2017 1000   APPEARANCEUR HAZY (A) 07/26/2017 1000   LABSPEC 1.015 07/26/2017 1000   PHURINE 6.0 07/26/2017 1000   GLUCOSEU NEGATIVE 07/26/2017 1000   HGBUR NEGATIVE 07/26/2017 1000   BILIRUBINUR NEGATIVE  07/26/2017 1000   KETONESUR NEGATIVE 07/26/2017 1000   PROTEINUR 100 (A) 07/26/2017 1000   NITRITE NEGATIVE 07/26/2017 1000   LEUKOCYTESUR TRACE (A) 07/26/2017 1000   Sepsis Labs: @LABRCNTIP (procalcitonin:4,lacticidven:4) ) Recent Results (from the past 240 hour(s))  MRSA PCR Screening     Status: None   Collection Time: 12/03/18  8:50 PM  Result Value Ref Range Status   MRSA by PCR NEGATIVE NEGATIVE Final    Comment:        The GeneXpert MRSA Assay (FDA approved for NASAL specimens only), is one component of a comprehensive MRSA colonization surveillance program. It is not intended to diagnose MRSA infection nor to guide or monitor treatment for MRSA infections. Performed at [facility]  [facility], [place] [address] [place], [place] [address]          Radiology Studies: No results found.    Scheduled Meds:  aspirin EC  81 mg Oral Daily   Chlorhexidine Gluconate Cloth  6 each Topical Q0600   Chlorhexidine Gluconate Cloth  6 each Topical Q0600   cholecalciferol  5,000 Units Oral Daily   darbepoetin (ARANESP) injection - DIALYSIS  40 mcg Intravenous Q Tue-HD   dorzolamide  1 drop Both Eyes BID   FLUoxetine  20 mg Oral Daily   gabapentin  300 mg Oral Daily   heparin  5,000 Units Subcutaneous Q8H   insulin aspart  0-5 Units Subcutaneous QHS   insulin aspart  0-9 Units Subcutaneous TID WC   insulin detemir  14 Units Subcutaneous Daily   latanoprost  1 drop Both Eyes QHS   multivitamin  1 tablet Oral QHS   pantoprazole  40 mg Oral Daily   rosuvastatin  40 mg Oral QHS   sodium chloride flush  3 mL Intravenous Q12H   ticagrelor  90 mg Oral BID   vitamin B-12  1,000 mcg Oral Daily   Continuous Infusions:  sodium chloride     sodium chloride       LOS: 3 days   [email or web address] Password [facility] 12/05/2018, 10:19 AM

## 2018-12-05 NOTE — TOC Benefit Eligibility Note (Signed)
Transition of Care [facility]) Benefit Eligibility Note    Patient Details  Name: Priscilla Davis MRN: [id number] Date of Birth: 09/09/1950   Medication/Dose: BRILINTA  90 MG  BID  Covered?: Yes  Tier: (CATASTROPHIC PHASE)  Prescription Coverage Preferred Pharmacy: YES([facility] AND [facility])  Spoke with Person/Company/Phone Number:: [employer]([facility] # [phone])  Co-Pay: $ 8.95             Memory Argue Phone Number: 12/05/2018, 11:36 AM

## 2018-12-05 NOTE — Progress Notes (Signed)
[phone] Called pt for education as we are not doing face to face encounters. Discussed with pt the importance of brilinta with stent. Pt needs to see case manager re brilinta. Discussed NTG use(if prescribed--pt's BP has been low). Pt knows if she cannot take NTG and she has CP to call 911. Reviewed walking with assistance to build up strength. Wrote out modified ex ed which is walking with family. Pt stated she has been feellng weak with the dialysis and was not doing much walking prior to admission. Discussed CRP 2 and referred to [place]. Pt stated she gets transportation through [facility] and has dialysis T-TH-[facility]AT. She will need transportation if she does program. Encouraged pt to talk with dietitian at [facility] for diet instruction. Pt stated she did not have MI so did not discuss those restrictions. Pt voiced understanding of ed. Graylon Good RN BSN 12/05/2018 10:11 AM

## 2018-12-06 DIAGNOSIS — E119 Type 2 diabetes mellitus without complications: Secondary | ICD-10-CM

## 2018-12-06 DIAGNOSIS — Z955 Presence of coronary angioplasty implant and graft: Secondary | ICD-10-CM

## 2018-12-06 LAB — CBC
HCT: 26 % — ABNORMAL LOW (ref 36.0–46.0)
Hemoglobin: 8.4 g/dL — ABNORMAL LOW (ref 12.0–15.0)
MCH: 29.1 pg (ref 26.0–34.0)
MCHC: 32.3 g/dL (ref 30.0–36.0)
MCV: 90 fL (ref 80.0–100.0)
Platelets: 256 10*3/uL (ref 150–400)
RBC: 2.89 MIL/uL — ABNORMAL LOW (ref 3.87–5.11)
RDW: 14.9 % (ref 11.5–15.5)
WBC: 10.8 10*3/uL — ABNORMAL HIGH (ref 4.0–10.5)
nRBC: 0.2 % (ref 0.0–0.2)

## 2018-12-06 LAB — RENAL FUNCTION PANEL
Albumin: 2.7 g/dL — ABNORMAL LOW (ref 3.5–5.0)
Anion gap: 12 (ref 5–15)
BUN: 32 mg/dL — ABNORMAL HIGH (ref 8–23)
CO2: 24 mmol/L (ref 22–32)
Calcium: 8.7 mg/dL — ABNORMAL LOW (ref 8.9–10.3)
Chloride: 102 mmol/L (ref 98–111)
Creatinine, Ser: 6.86 mg/dL — ABNORMAL HIGH (ref 0.44–1.00)
GFR calc Af Amer: 6 mL/min — ABNORMAL LOW (ref >60)
GFR calc non Af Amer: 6 mL/min — ABNORMAL LOW (ref >60)
Glucose, Bld: 167 mg/dL — ABNORMAL HIGH (ref 70–99)
Phosphorus: 3.8 mg/dL (ref 2.5–4.6)
Potassium: 3.5 mmol/L (ref 3.5–5.1)
Sodium: 138 mmol/L (ref 135–145)

## 2018-12-06 LAB — GLUCOSE, CAPILLARY
Glucose-Capillary: 175 mg/dL — ABNORMAL HIGH (ref 70–99)
Glucose-Capillary: 208 mg/dL — ABNORMAL HIGH (ref 70–99)
Glucose-Capillary: 216 mg/dL — ABNORMAL HIGH (ref 70–99)
Glucose-Capillary: 169 mg/dL — ABNORMAL HIGH (ref 70–99)

## 2018-12-06 MED ORDER — LIDOCAINE-PRILOCAINE 2.5-2.5 % EX CREA: 1.0000 "application " | TOPICAL_CREAM | CUTANEOUS | Status: DC | PRN

## 2018-12-06 MED ORDER — MIDODRINE HCL 5 MG PO TABS: 10.0000 mg | ORAL_TABLET | Freq: Two times a day (BID) | ORAL | Status: DC

## 2018-12-06 MED ORDER — SODIUM CHLORIDE 0.9 % IV BOLUS
1000.0000 mL | Freq: Once | INTRAVENOUS | Status: AC
  Administered 2018-12-06: 1000 mL via INTRAVENOUS

## 2018-12-06 MED ORDER — SODIUM CHLORIDE 0.9 % IV SOLN: 100.0000 mL | INTRAVENOUS | Status: DC | PRN

## 2018-12-06 MED ORDER — HEPARIN SODIUM (PORCINE) 1000 UNIT/ML DIALYSIS: 1000.0000 [IU] | INTRAMUSCULAR | Status: DC | PRN

## 2018-12-06 MED ORDER — LIDOCAINE HCL (PF) 1 % IJ SOLN: 5.0000 mL | INTRAMUSCULAR | Status: DC | PRN

## 2018-12-06 MED ORDER — PENTAFLUOROPROP-TETRAFLUOROETH EX AERO: 1.0000 "application " | INHALATION_SPRAY | CUTANEOUS | Status: DC | PRN

## 2018-12-06 MED ORDER — MIDODRINE HCL 5 MG PO TABS
10.0000 mg | ORAL_TABLET | Freq: Two times a day (BID) | ORAL | Status: DC
  Administered 2018-12-06 – 2018-12-11 (×10): 10 mg via ORAL
  Filled 2018-12-06 (×10): qty 2

## 2018-12-06 NOTE — Progress Notes (Signed)
PT Cancellation Note  Patient Details Name: Priscilla Davis MRN: [id number] DOB: 02-28-1951   Cancelled Treatment:    Reason Eval/Treat Not Completed: Medical issues which prohibited therapy. Per RN, pt with orthostatic hypotension, systolic down to 83K; requesting hold PT treatment. Will follow-up as appropriate.  Mabeline Caras, PT, DPT Acute Rehabilitation Services  Pager [phone] Office [place] 12/06/2018, 1:44 PM

## 2018-12-06 NOTE — Progress Notes (Addendum)
PROGRESS NOTE    Priscilla Davis   KDX:[id number]  DOB: September 08, 1950  DOA: 12/01/2018 PCP: Kristen Loader, FNP   Brief Narrative:  Priscilla Davis is a 68 y.o. female with medical history significant of ESRD recently started on hemodialysis through left AV fistula, cardiomyopathy, abnormal nuclear stress test, EF of 40%, hypertension and diabetes , recently quit a smoker who presents to the emergency room with worsening shortness of breath and chest discomfort for 3 weeks.  She was admitted January 2020 with chest tightness and CHF. Echocardiogram showed ejection fraction 35 to 40%, global hypokinesis, grade 1 diastolic dysfunction and moderate left atrial enlargement. Myoview showed basilar inferior, inferolateral, anterolateral nonreversible defect suggestive of infarct but no ischemia. Cath was not done to prevent renal failure. She started HD about 3 wks ago and comes back to the hospital for ongoing episodes of chest pain.    Subjective: had HD this am , per [facility] RN bp's dropped into "70's" and goal was cut back from 2 L to 1.5 L .  Post HD patient's standing BP's dropped into 80's and patient declined trying to walk with PT due to dizziness    Assessment & Plan:   Principal Problem:   Chest pain   SOB (shortness of breath)  CAD - appreciate cardiology eval-   - cath 4/6 >   Ost 2nd Mrg to 2nd Mrg lesion is 100% stenosed.  Ost 3rd Mrg lesion is 100% stenosed.  Prox RCA to Mid RCA lesion is 40% stenosed.  Dist RCA lesion is 95% stenosed.  A drug-eluting stent was successfully placed.  Post intervention, there is a 0% residual stenosis.  There is moderate left ventricular systolic dysfunction.  LV end diastolic pressure is mildly elevated. The left ventricular ejection fraction is 45-50% by visual estimate - - cont ASA and statin- started on Brillinta  - dc Coreg , see below   Active Problems: Orthostatic hypotension- h/o HTN - no vol excess on exam, wt's stable but is 2kg  under dry wt - still orthostatic after HD today; 1.5 L removed w/ +BP drops - seen by PT, not able to walk today after HD , standing BP's in 80's- 90's - some of her ambulating I think is deconditioning from prolonged uremic period, but patient is not sure she agrees - plan today is for 1 L NS bolus and start midodrine 10 bid - have d/w nephrology about orthostatic BP problems , they agree w/ midodrine Rx    ESRD (end stage renal disease)   - on TTS schedule as outpt, had HD today - has AVF - is under dry wt, see above   Hypertension: home amlodipine, coreg and lasix have all been stopped - BP's normal   Anemia of chronic disease - cont management per nephrology    DM2 - cont Levemir  and ISS - Victoza on hold while inpatient   DVT prophylaxis: Heparin Code Status: Full code Family Communication: d/w patient Disposition Plan: not ready for dc yet    Rob Doctor, hospital / [facility] [phone] 12/06/2018, 1:59 PM   Consultants:   Nephrology  cardiology Procedures:     Antimicrobials:  Anti-infectives (From admission, onward)   None       Objective: Vitals:   12/06/18 1030 12/06/18 1051 12/06/18 1200 12/06/18 1303  BP: (!) 112/55 134/70 122/60 (!) 118/58  Pulse: 72 72 64 66  Resp:  16 16   Temp:  98.3 F (36.8 C) 97.8 F (36.6 C)  TempSrc:  Oral Oral   SpO2:  98%    Weight:  89.1 kg    Height:        Intake/Output Summary (Last 24 hours) at 12/06/2018 1359 Last data filed at 12/06/2018 1051 Gross per 24 hour  Intake 240 ml  Output 1500 ml  Net -1260 ml   Filed Weights   12/04/18 1215 12/06/18 0645 12/06/18 1051  Weight: 88.6 kg 92.2 kg 89.1 kg    Examination: General exam: Appears comfortable  HEENT: PERRLA, oral mucosa moist, no sclera icterus or thrush Respiratory system: Clear to auscultation. Respiratory effort normal. Cardiovascular system: S1 & S2 heard,  No murmurs  Gastrointestinal system: Abdomen soft, non-tender, nondistended. Normal bowel sounds    Central nervous system: Alert and oriented. No focal neurological deficits. Extremities: No cyanosis, clubbing or edema Skin: No rashes or ulcers Psychiatry:  Mood & affect appropriate.     Data Reviewed: I have personally reviewed following labs and imaging studies  CBC: Recent Labs  Lab 12/01/18 1027 12/03/18 0814 12/04/18 0306 12/06/18 0654  WBC 11.5* 9.9 10.2 10.8*  NEUTROABS 9.1*  --   --   --   HGB 10.6* 9.5* 8.7* 8.4*  HCT 34.6* 30.1* 27.1* 26.0*  MCV 90.8 90.7 89.7 90.0  PLT 289 256 251 191   Basic Metabolic Panel: Recent Labs  Lab 12/01/18 1027 12/03/18 0814 12/04/18 0306 12/06/18 0654  NA 137 136 135 138  K 4.6 3.3* 3.5 3.5  CL 98 99 102 102  CO2 21* 24 22 24   GLUCOSE 226* 214* 193* 167*  BUN 21 30* 36* 32*  CREATININE 5.35* 5.81* 6.47* 6.86*  CALCIUM 9.3 9.0 8.5* 8.7*  PHOS  --   --   --  3.8   GFR: Estimated Creatinine Clearance: 9.2 mL/min (A) (by C-G formula based on SCr of 6.86 mg/dL (H)). Liver Function Tests: Recent Labs  Lab 12/01/18 1027 12/06/18 0654  AST 37  --   ALT 26  --   ALKPHOS 63  --   BILITOT 1.4*  --   PROT 7.3  --   ALBUMIN 3.5 2.7*   No results for input(s): LIPASE, AMYLASE in the last 168 hours. No results for input(s): AMMONIA in the last 168 hours. Coagulation Profile: No results for input(s): INR, PROTIME in the last 168 hours. Cardiac Enzymes: Recent Labs  Lab 12/01/18 1027 12/01/18 1332 12/01/18 2019 12/02/18 0213  TROPONINI <0.03 <0.03 <0.03 <0.03   BNP (last 3 results) No results for input(s): PROBNP in the last 8760 hours. HbA1C: No results for input(s): HGBA1C in the last 72 hours. CBG: Recent Labs  Lab 12/05/18 1105 12/05/18 1628 12/05/18 2156 12/06/18 0628 12/06/18 1254  GLUCAP 193* 300* 258* 175* 208*   Lipid Profile: No results for input(s): CHOL, HDL, LDLCALC, TRIG, CHOLHDL, LDLDIRECT in the last 72 hours. Thyroid Function Tests: No results for input(s): TSH, T4TOTAL, FREET4, T3FREE,  THYROIDAB in the last 72 hours. Anemia Panel: Recent Labs    12/03/18 1658  TIBC 185*  IRON 39   Urine analysis:    Component Value Date/Time   COLORURINE YELLOW 07/26/2017 1000   APPEARANCEUR HAZY (A) 07/26/2017 1000   LABSPEC 1.015 07/26/2017 1000   PHURINE 6.0 07/26/2017 1000   GLUCOSEU NEGATIVE 07/26/2017 1000   HGBUR NEGATIVE 07/26/2017 1000   BILIRUBINUR NEGATIVE 07/26/2017 1000   KETONESUR NEGATIVE 07/26/2017 1000   PROTEINUR 100 (A) 07/26/2017 1000   NITRITE NEGATIVE 07/26/2017 1000   LEUKOCYTESUR TRACE (A) 07/26/2017  1000   Sepsis Labs: @LABRCNTIP (procalcitonin:4,lacticidven:4) ) Recent Results (from the past 240 hour(s))  MRSA PCR Screening     Status: None   Collection Time: 12/03/18  8:50 PM  Result Value Ref Range Status   MRSA by PCR NEGATIVE NEGATIVE Final    Comment:        The GeneXpert MRSA Assay (FDA approved for NASAL specimens only), is one component of a comprehensive MRSA colonization surveillance program. It is not intended to diagnose MRSA infection nor to guide or monitor treatment for MRSA infections. Performed at [facility], [place] [address], [place], [place] [address]          Radiology Studies: No results found.    Scheduled Meds: . aspirin EC  81 mg Oral Daily  . cholecalciferol  5,000 Units Oral Daily  . darbepoetin (ARANESP) injection - DIALYSIS  40 mcg Intravenous Q Tue-HD  . dorzolamide  1 drop Both Eyes BID  . FLUoxetine  20 mg Oral Daily  . gabapentin  300 mg Oral Daily  . heparin  5,000 Units Subcutaneous Q8H  . insulin aspart  0-5 Units Subcutaneous QHS  . insulin aspart  0-9 Units Subcutaneous TID WC  . insulin detemir  14 Units Subcutaneous Daily  . latanoprost  1 drop Both Eyes QHS  . midodrine  10 mg Oral BID WC  . multivitamin  1 tablet Oral QHS  . pantoprazole  40 mg Oral Daily  . rosuvastatin  40 mg Oral QHS  . sodium chloride flush  3 mL Intravenous Q12H  . ticagrelor  90 mg Oral BID  .  vitamin B-12  1,000 mcg Oral Daily   Continuous Infusions: . sodium chloride    . sodium chloride       LOS: 4 days   [email or web address] Password [email or web address] 12/06/2018, 1:59 PM

## 2018-12-06 NOTE — Progress Notes (Signed)
[facility] ROUNDING NOTE   Subjective:   This is a very pleasant 68 year old lady that was admitted with chest pain.  She is end-stage renal disease with a recent stopped on dialysis her first dialysis treatment was 11/15/2018.  She dialyzes Tuesday Thursday Saturday.  Cardiac catheterization status post drug-eluting stent to the proximal RCA 12/03/2018 appreciate assistance from Dr. Gwenlyn Found.  She has been orthostatic and Coreg has been discontinued by Dr. Jonnie Finner (appreciate assistance) we will also try to limit amount of fluid that we ultrafiltrate with dialysis.  She is seen on dialysis this morning appears to be doing fine  Blood pressure 122/62 pulse 67 temperature 98.4 O2 sats 98% room air  dialysis with removal of 800 cc.  Aspirin 81 mg daily  Crestor 40 mg daily Prozac 20 mg daily insulin sliding scale, Levemir 14 units subcu, Protonix 40 mg daily vitamin B12 1000 mcg daily, Neurontin 300 mg daily, Rena-Vite 1 daily, cholecalciferol 5000 units daily,.  Aranesp 40 mcg 12/04/2018, Brilinta 90 mg daily twice daily  WBC 10.8 hemoglobin 8.4 platelets 256.   Objective:  Vital signs in last 24 hours:  Temp:  [98 F (36.7 C)-98.4 F (36.9 C)] 98.4 F (36.9 C) (04/09 0645) Pulse Rate:  [67-98] 67 (04/09 0730) Resp:  [11-20] 18 (04/09 0655) BP: (96-148)/(42-87) 122/62 (04/09 0730) SpO2:  [98 %-100 %] 98 % (04/09 0645) Weight:  [92.2 kg] 92.2 kg (04/09 0645)  Weight change: 2.3 kg Filed Weights   12/04/18 0810 12/04/18 1215 12/06/18 0645  Weight: 89.9 kg 88.6 kg 92.2 kg    Intake/Output: I/O last 3 completed shifts: In: 480 [P.O.:480] Out: -    Intake/Output this shift:  No intake/output data recorded.  [facility]- RRR RS- CTA ABD- BS present soft non-distended EXT- no edema   left AV graft with bruit   Basic Metabolic Panel: Recent Labs  Lab 12/01/18 1027 12/03/18 0814 12/04/18 0306  NA 137 136 135  K 4.6 3.3* 3.5  CL 98 99 102  CO2 21* 24 22  GLUCOSE 226*  214* 193*  BUN 21 30* 36*  CREATININE 5.35* 5.81* 6.47*  CALCIUM 9.3 9.0 8.5*    Liver Function Tests: Recent Labs  Lab 12/01/18 1027  AST 37  ALT 26  ALKPHOS 63  BILITOT 1.4*  PROT 7.3  ALBUMIN 3.5   No results for input(s): LIPASE, AMYLASE in the last 168 hours. No results for input(s): AMMONIA in the last 168 hours.  CBC: Recent Labs  Lab 12/01/18 1027 12/03/18 0814 12/04/18 0306 12/06/18 0654  WBC 11.5* 9.9 10.2 10.8*  NEUTROABS 9.1*  --   --   --   HGB 10.6* 9.5* 8.7* 8.4*  HCT 34.6* 30.1* 27.1* 26.0*  MCV 90.8 90.7 89.7 90.0  PLT 289 256 251 256    Cardiac Enzymes: Recent Labs  Lab 12/01/18 1027 12/01/18 1332 12/01/18 2019 12/02/18 0213  TROPONINI <0.03 <0.03 <0.03 <0.03    BNP: Invalid input(s): POCBNP  CBG: Recent Labs  Lab 12/05/18 0634 12/05/18 1105 12/05/18 1628 12/05/18 2156 12/06/18 0628  GLUCAP 229* 193* 300* 258* 175*    Microbiology: Results for orders placed or performed during the hospital encounter of 12/01/18  MRSA PCR Screening     Status: None   Collection Time: 12/03/18  8:50 PM  Result Value Ref Range Status   MRSA by PCR NEGATIVE NEGATIVE Final    Comment:        The GeneXpert MRSA Assay (FDA approved for NASAL specimens  only), is one component of a comprehensive MRSA colonization surveillance program. It is not intended to diagnose MRSA infection nor to guide or monitor treatment for MRSA infections. Performed at [facility], [place] [address], [place], [place] [address]     Coagulation Studies: No results for input(s): LABPROT, INR in the last 72 hours.  Urinalysis: No results for input(s): COLORURINE, LABSPEC, PHURINE, GLUCOSEU, HGBUR, BILIRUBINUR, KETONESUR, PROTEINUR, UROBILINOGEN, NITRITE, LEUKOCYTESUR in the last 72 hours.  Invalid input(s): APPERANCEUR    Imaging: No results found.   Medications:   . sodium chloride    . sodium chloride    . sodium chloride     . aspirin EC  81 mg  Oral Daily  . cholecalciferol  5,000 Units Oral Daily  . darbepoetin (ARANESP) injection - DIALYSIS  40 mcg Intravenous Q Tue-HD  . dorzolamide  1 drop Both Eyes BID  . FLUoxetine  20 mg Oral Daily  . gabapentin  300 mg Oral Daily  . heparin  5,000 Units Subcutaneous Q8H  . insulin aspart  0-5 Units Subcutaneous QHS  . insulin aspart  0-9 Units Subcutaneous TID WC  . insulin detemir  14 Units Subcutaneous Daily  . latanoprost  1 drop Both Eyes QHS  . multivitamin  1 tablet Oral QHS  . pantoprazole  40 mg Oral Daily  . rosuvastatin  40 mg Oral QHS  . sodium chloride flush  3 mL Intravenous Q12H  . ticagrelor  90 mg Oral BID  . vitamin B-12  1,000 mcg Oral Daily   sodium chloride, sodium chloride, sodium chloride, acetaminophen, albuterol, heparin, lidocaine (PF), lidocaine-prilocaine, nitroGLYCERIN, ondansetron (ZOFRAN) IV, pentafluoroprop-tetrafluoroeth, sodium chloride flush  Assessment/ Plan:   Chest pain history of cardiomyopathy, plan for cardiac catheterization 12/03/2018 appreciate assistance from Dr. Gwenlyn Found with PCI and drug-eluting stent to RCA.  Last 2D echo 09/10/2018 EF 35 to 40%.  Aortic valve sclerotic without stenosis trivial mitral regurgitation.  Patient has been started on Brilinta 90 mg twice daily  End-stage renal disease with dialysis Tuesday Thursday Saturday.  Patient seen and evaluated on dialysis stable  Hypertension blood pressure much better off amlodipine and Lasix.  Coreg has been discontinued too due to orthostatic hypotension  Volume appears to be stable with clear lung fields to examination.  Anemia stable slight decrease in hemoglobin, iron saturations appear to be adequate.  She is started on darbepoetin.  40 mcg q. Tuesday  Metabolic bone disease no VDRA at present  Diabetes mellitus as per primary team.  Recent increase in Lantus  Hyperlipidemia continues on Crestor  Disposition appears to be stable for discharge from renal standpoint   LOS:  [place] @TODAY @7 :50 AM

## 2018-12-06 NOTE — Progress Notes (Signed)
PT Cancellation Note  Patient Details Name: Priscilla Davis MRN: [id number] DOB: 1951-02-19   Cancelled Treatment:    Reason Eval/Davis Not Completed: Patient at procedure or test/unavailable (HD). Will follow-up for PT treatment as schedule permits.  Mabeline Caras, PT, DPT Acute Rehabilitation Services  Pager [phone] Office [place] 12/06/2018, 7:29 AM

## 2018-12-06 NOTE — Progress Notes (Signed)
Progress Note  Patient Name: Priscilla Davis Date of Encounter: 12/06/2018  Primary Cardiologist: Skeet Latch, MD   Subjective   Undergoing HD this morning.  Inpatient Medications    Scheduled Meds: . aspirin EC  81 mg Oral Daily  . cholecalciferol  5,000 Units Oral Daily  . darbepoetin (ARANESP) injection - DIALYSIS  40 mcg Intravenous Q Tue-HD  . dorzolamide  1 drop Both Eyes BID  . FLUoxetine  20 mg Oral Daily  . gabapentin  300 mg Oral Daily  . heparin  5,000 Units Subcutaneous Q8H  . insulin aspart  0-5 Units Subcutaneous QHS  . insulin aspart  0-9 Units Subcutaneous TID WC  . insulin detemir  14 Units Subcutaneous Daily  . latanoprost  1 drop Both Eyes QHS  . multivitamin  1 tablet Oral QHS  . pantoprazole  40 mg Oral Daily  . rosuvastatin  40 mg Oral QHS  . sodium chloride flush  3 mL Intravenous Q12H  . ticagrelor  90 mg Oral BID  . vitamin B-12  1,000 mcg Oral Daily   Continuous Infusions: . sodium chloride    . sodium chloride    . sodium chloride     PRN Meds: sodium chloride, sodium chloride, sodium chloride, acetaminophen, albuterol, heparin, lidocaine (PF), lidocaine-prilocaine, nitroGLYCERIN, ondansetron (ZOFRAN) IV, pentafluoroprop-tetrafluoroeth, sodium chloride flush   Vital Signs    Vitals:   12/06/18 0915 12/06/18 0930 12/06/18 0945 12/06/18 1000  BP: (!) 121/59 (!) 108/56 (!) 119/54 (!) 112/57  Pulse: 68 68 70 71  Resp:      Temp:      TempSrc:      SpO2:      Weight:      Height:        Intake/Output Summary (Last 24 hours) at 12/06/2018 1026 Last data filed at 12/05/2018 1519 Gross per 24 hour  Intake 240 ml  Output -  Net 240 ml   Last 3 Weights 12/06/2018 12/04/2018 12/04/2018  Weight (lbs) 203 lb 4.2 oz 195 lb 5.2 oz 198 lb 3.1 oz  Weight (kg) 92.2 kg 88.6 kg 89.9 kg      Telemetry    Sinus - Personally Reviewed  ECG    NSR 74; ILBBB- Personally Reviewed  Physical Exam   GEN: No acute distress.   Neck: No JVD Cardiac:  RRR, no murmurs, rubs, or gallops.  Respiratory: Clear to auscultation bilaterally. GI: Soft, nontender, non-distended  MS: No edema; No deformity. Support stockings now in place Neuro:  Nonfocal  Psych: Normal affect   Labs    Chemistry Recent Labs  Lab 12/01/18 1027 12/03/18 0814 12/04/18 0306 12/06/18 0654  NA 137 136 135 138  K 4.6 3.3* 3.5 3.5  CL 98 99 102 102  CO2 21* 24 22 24   GLUCOSE 226* 214* 193* 167*  BUN 21 30* 36* 32*  CREATININE 5.35* 5.81* 6.47* 6.86*  CALCIUM 9.3 9.0 8.5* 8.7*  PROT 7.3  --   --   --   ALBUMIN 3.5  --   --  2.7*  AST 37  --   --   --   ALT 26  --   --   --   ALKPHOS 63  --   --   --   BILITOT 1.4*  --   --   --   GFRNONAA 8* 7* 6* 6*  GFRAA 9* 8* 7* 6*  ANIONGAP 18* 13 11 12      Hematology Recent Labs  Lab 12/03/18 0814 12/04/18 0306 12/06/18 0654  WBC 9.9 10.2 10.8*  RBC 3.32* 3.02* 2.89*  HGB 9.5* 8.7* 8.4*  HCT 30.1* 27.1* 26.0*  MCV 90.7 89.7 90.0  MCH 28.6 28.8 29.1  MCHC 31.6 32.1 32.3  RDW 14.4 14.8 14.9  PLT 256 251 256    Cardiac Enzymes Recent Labs  Lab 12/01/18 1027 12/01/18 1332 12/01/18 2019 12/02/18 0213  TROPONINI <0.03 <0.03 <0.03 <0.03   No results for input(s): TROPIPOC in the last 168 hours.   BNP Recent Labs  Lab 12/01/18 1030  BNP 37.0     DDimer No results for input(s): DDIMER in the last 168 hours.   Radiology    No results found.  Cardiac Studies    CARDIAC CATH: 12/03/2018   Ost 2nd Mrg to 2nd Mrg lesion is 100% stenosed.  Ost 3rd Mrg lesion is 100% stenosed.  Prox RCA to Mid RCA lesion is 40% stenosed.  Dist RCA lesion is 95% stenosed.  A drug-eluting stent was successfully placed.  Post intervention, there is a 0% residual stenosis.  There is moderate left ventricular systolic dysfunction.  LV end diastolic pressure is mildly elevated.  The left ventricular ejection fraction is 45-50% by visual estimate.       Intervention     Patient Profile      68 y.o. female with a hx of diabetes mellitus, hypertension, end-stage renal disease recently initiated on dialysiswho is being seen for the evaluation of dyspnea and chest tightness. Patient was admitted January 2020 with chest tightness and CHF. Echocardiogram showed ejection fraction 35 to 40%, global hypokinesis, grade 1 diastolic dysfunction and moderate left atrial enlargement. Troponin was elevated but felt to be demand ischemia. Catheterization was avoided due to risk of contrast nephropathy. Nuclear study January 2020 showed ejection fraction 44%. There was a basilar inferior, inferolateral, anterolateral nonreversible defect suggestive of infarct but no ischemia. Patient recently started on hemodialysis 3 weeks ago. Dr. Oval Linsey had planned cardiac catheterization electively to evaluate cardiomyopathy once coronavirus pandemic improved. Patient presented with dyspnea, weakness and chest tightness.  Assessment & Plan    1. Chest tightness: s/p cath w/ DES RCA, med rx for 100% OM1 & OM2. EF 45-50% via LV gram. On DAPT with ASA, Brilinta, high-dose statin. No other anti-ischemic medications at this time 2/2 to hypotension and dizziness. May consider adding back as outpatient.  2. 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 Unable to add medications at this time with low BPs.  -- volume management via HD  3. ESRD on HD: --Undergoing HD this morning. HD on Tu/Th/Sat per renal team  4. Hypertension: has been hypotensive with now all blood pressure medications stopped. BP stable this morning.  Orthostatic over the past several days. Support stockings with 20 - 30 mm H added yesterday.  5. Hyperlipidemia: -- goal LDL now < 70, continue Crestor 40 mg  For questions or updates, please contact [place] Please consult [email or web address] for contact info under   Signed, Reino Bellis, NP  12/06/2018, 10:26 AM    [facility] will sign off.   Medication  Recommendations:  Noted above Other recommendations (labs, testing, etc):  none Follow up as an outpatient:  [facility] tele-health visit  Patient seen and examined. Agree with assessment and plan.  Patient is undergoing dialysis.  No chest pain.  Yesterday she had orthostatic hypotension with so she had a dizziness with standing.  This was improved compared to the previous day.  Compression stockings  were added last evening with 20 to 30 mm pressure.  They are in place presently.  No chest pain.  Continue aspirin/Brilinta/high potency statin therapy.  If blood pressure stabilizes, consider adding low-dose anti-ischemic education if blood pressure allows.   Troy Sine, MD, [facility] 12/06/2018 10:50 AM

## 2018-12-07 LAB — GLUCOSE, CAPILLARY
Glucose-Capillary: 137 mg/dL — ABNORMAL HIGH (ref 70–99)
Glucose-Capillary: 231 mg/dL — ABNORMAL HIGH (ref 70–99)
Glucose-Capillary: 239 mg/dL — ABNORMAL HIGH (ref 70–99)
Glucose-Capillary: 159 mg/dL — ABNORMAL HIGH (ref 70–99)

## 2018-12-07 MED ORDER — SODIUM CHLORIDE 0.9 % IV BOLUS
250.0000 mL | Freq: Once | INTRAVENOUS | Status: AC
  Administered 2018-12-07: 250 mL via INTRAVENOUS

## 2018-12-07 MED ORDER — CHLORHEXIDINE GLUCONATE CLOTH 2 % EX PADS
6.0000 | MEDICATED_PAD | Freq: Every day | CUTANEOUS | Status: DC
  Administered 2018-12-07 – 2018-12-08 (×2): 6 via TOPICAL

## 2018-12-07 NOTE — Progress Notes (Signed)
PT Cancellation Note  Patient Details Name: Priscilla Davis MRN: [id number] DOB: 01/07/1951   Cancelled Treatment:    Reason Eval/Treat Not Completed: Medical issues which prohibited therapy. Pt continues to have symptomatic orthostasis.   [place] 12/07/2018, 4:21 PM [place] Pager [phone] Office [phone]

## 2018-12-07 NOTE — Progress Notes (Signed)
Progress Note  Patient Name: Priscilla Davis Date of Encounter: 12/07/2018  Primary Cardiologist: Skeet Latch, MD   Subjective   Dizziness with standing  Inpatient Medications    Scheduled Meds: . aspirin EC  81 mg Oral Daily  . cholecalciferol  5,000 Units Oral Daily  . darbepoetin (ARANESP) injection - DIALYSIS  40 mcg Intravenous Q Tue-HD  . dorzolamide  1 drop Both Eyes BID  . FLUoxetine  20 mg Oral Daily  . gabapentin  300 mg Oral Daily  . heparin  5,000 Units Subcutaneous Q8H  . insulin aspart  0-5 Units Subcutaneous QHS  . insulin aspart  0-9 Units Subcutaneous TID WC  . insulin detemir  14 Units Subcutaneous Daily  . latanoprost  1 drop Both Eyes QHS  . midodrine  10 mg Oral BID WC  . multivitamin  1 tablet Oral QHS  . pantoprazole  40 mg Oral Daily  . rosuvastatin  40 mg Oral QHS  . sodium chloride flush  3 mL Intravenous Q12H  . ticagrelor  90 mg Oral BID  . vitamin B-12  1,000 mcg Oral Daily   Continuous Infusions: . sodium chloride     PRN Meds: sodium chloride, acetaminophen, albuterol, nitroGLYCERIN, ondansetron (ZOFRAN) IV, sodium chloride flush   Vital Signs    Vitals:   12/06/18 2225 12/06/18 2227 12/07/18 0337 12/07/18 0709  BP: 136/71 113/63 (!) 125/53 (!) 142/64  Pulse:    73  Resp:      Temp:   98.1 F (36.7 C) (!) 97.5 F (36.4 C)  TempSrc:   Oral Oral  SpO2:    96%  Weight:      Height:        Intake/Output Summary (Last 24 hours) at 12/07/2018 0828 Last data filed at 12/06/2018 1926 Gross per 24 hour  Intake 120 ml  Output 1500 ml  Net -1380 ml   Last 3 Weights 12/06/2018 12/06/2018 12/04/2018  Weight (lbs) 196 lb 6.9 oz 203 lb 4.2 oz 195 lb 5.2 oz  Weight (kg) 89.1 kg 92.2 kg 88.6 kg      Telemetry    Sinus - Personally Reviewed  ECG    12/03/18, NSR incomplete LBBB - Personally Reviewed  Physical Exam   GEN: No acute distress.   Neck: No JVD Cardiac: RRR, no murmurs, rubs, or gallops.  Respiratory: Clear to  auscultation bilaterally. GI: Soft, nontender, non-distended  MS: No edema; No deformity. Neuro:  Nonfocal  Psych: Normal affect   Labs    Chemistry Recent Labs  Lab 12/01/18 1027 12/03/18 0814 12/04/18 0306 12/06/18 0654  NA 137 136 135 138  K 4.6 3.3* 3.5 3.5  CL 98 99 102 102  CO2 21* 24 22 24   GLUCOSE 226* 214* 193* 167*  BUN 21 30* 36* 32*  CREATININE 5.35* 5.81* 6.47* 6.86*  CALCIUM 9.3 9.0 8.5* 8.7*  PROT 7.3  --   --   --   ALBUMIN 3.5  --   --  2.7*  AST 37  --   --   --   ALT 26  --   --   --   ALKPHOS 63  --   --   --   BILITOT 1.4*  --   --   --   GFRNONAA 8* 7* 6* 6*  GFRAA 9* 8* 7* 6*  ANIONGAP 18* 13 11 12      Hematology Recent Labs  Lab 12/03/18 0814 12/04/18 0306 12/06/18 0654  WBC 9.9  10.2 10.8*  RBC 3.32* 3.02* 2.89*  HGB 9.5* 8.7* 8.4*  HCT 30.1* 27.1* 26.0*  MCV 90.7 89.7 90.0  MCH 28.6 28.8 29.1  MCHC 31.6 32.1 32.3  RDW 14.4 14.8 14.9  PLT 256 251 256    Cardiac Enzymes Recent Labs  Lab 12/01/18 1027 12/01/18 1332 12/01/18 2019 12/02/18 0213  TROPONINI <0.03 <0.03 <0.03 <0.03   No results for input(s): TROPIPOC in the last 168 hours.   BNP Recent Labs  Lab 12/01/18 1030  BNP 37.0     DDimer No results for input(s): DDIMER in the last 168 hours.   Radiology    No results found.  Cardiac Studies   CARDIAC CATH: 12/03/2018   Ost 2nd Mrg to 2nd Mrg lesion is 100% stenosed.  Ost 3rd Mrg lesion is 100% stenosed.  Prox RCA to Mid RCA lesion is 40% stenosed.  Dist RCA lesion is 95% stenosed.  A drug-eluting stent was successfully placed.  Post intervention, there is a 0% residual stenosis.  There is moderate left ventricular systolic dysfunction.  LV end diastolic pressure is mildly elevated.  The left ventricular ejection fraction is 45-50% by visual estimate.      Intervention     Patient Profile     68 y.o. female with a hx of diabetes mellitus, hypertension, end-stage renal disease  recently initiated on dialysiswho is being seen for the evaluation of dyspnea and chest tightness. Patient was admitted January 2020 with chest tightness and CHF. Echocardiogram showed ejection fraction 35 to 40%, global hypokinesis, grade 1 diastolic dysfunction and moderate left atrial enlargement. Troponin was elevated but felt to be demand ischemia. Catheterization was avoided due to risk of contrast nephropathy. Nuclear study January 2020 showed ejection fraction 44%. There was a basilar inferior, inferolateral, anterolateral nonreversible defect suggestive of infarct but no ischemia. Patient recently started on hemodialysis 3 weeks ago. Dr. Oval Linsey had planned cardiac catheterization electively to evaluate cardiomyopathy once coronavirus pandemic improved. However, patient presented to the ED 12/01/18 with dyspnea, weakness and chest tightness. Cardiac cath 4/6 showed obstructive CAD as outlined above and pt underwent PCI to RCA.   Assessment & Plan   1. CAD: s/p LHC 12/03/18 which demonstrated occluded small second and third obtuse marginal branches which appear chronic (no attempted intervention).  Her LAD is widely patent and wraps the apex.  Her RCA is codominant with diffuse moderate disease as well as a high-grade distal stenosis at the "crux". This was successful treated w/ PCI + DES.  EF 45-50% via LV gram. Plan to treated w/ DAPT w/ ASA and Brilinta for a minimum of 12 months, + high dose statin. Unable to tolerate  blocker and ACE/ARB due to hypotension.   2. Ischemic Cardiomyopathy: EF 45-50% at cath +WMA. Medial management limited due to hypotension. BP unable to tolerate  blocker, ACE/ARB. Volume is being controlled through HD.   3. ESRD on HD: HD on Tu/Th/Sat. Management per nephrology.   4. HTN: BP has been low, requiring midodrine. Previously on amlodipine, Lasix and Coreg, all discontinued due to orthostatic hypotension.   5. Lipids: good control on statin. Continue Crestor.  Lipid panel 09/10/18 showed LDL to be at goal of < 70 mg/dl at 45 mg/dl. HDL good at 45. TGs 102.   6. DM: on insulin. Management per IM.   7. Anemia of Chronic Disease: 2/2 CKD. Started on weekly darbepoetin. Nephrology managing.   MD to assess and will add additional recs.    For questions  or updates, please contact [place] Please consult [email or web address] for contact info under      Signed, Lyda Jester, PA-C  12/07/2018, 8:28 AM       Patient seen and examined. Agree with assessment and plan. No recurrent chest pain or dyspnea. Orthostatic hyotension persists with  Supine 171/65;  Sitting 137/68, and standing BP 114/67. Pt was symptomatic with dizziness upon standing.  There were taken without the support stocking; now on, fluid being administered and midodrine was initiated by primary team, with plans to re-check later.  No angina off cardiac meds at present. On DAPT.   Troy Sine, MD, [facility] 12/07/2018 11:35 AM

## 2018-12-07 NOTE — Progress Notes (Addendum)
[facility] ROUNDING NOTE   Subjective:   This is a very pleasant 69 year old lady that was admitted with chest pain.  She is end-stage renal disease with a recent stopped on dialysis her first dialysis treatment was 11/15/2018.  She dialyzes Tuesday Thursday Saturday.  Cardiac catheterization status post drug-eluting stent to the proximal RCA 12/03/2018 appreciate assistance from Dr. Gwenlyn Found.  She has been orthostatic and Coreg has been discontinued by Dr. Jonnie Finner (appreciate assistance) we will also try to limit amount of fluid that we ultrafiltrate with dialysis.  She underwent successful dialysis treatment 12/06/2018 there was the ultrafiltration of 1.5 L  Blood pressure 142/64 pulse 73 temperature 97.5  dialysis with removal of 1.5 L  Aspirin 81 mg daily Midodrine 10mg  bid   Crestor 40 mg daily Prozac 20 mg daily insulin sliding scale, Levemir 14 units subcu, Protonix 40 mg daily vitamin B12 1000 mcg daily, Neurontin 300 mg daily, Rena-Vite 1 daily, cholecalciferol 5000 units daily,.  Aranesp 40 mcg 12/04/2018, Brilinta 90 mg daily twice daily  Labs from 12/06/2018.  Sodium 138 potassium 3.5 chloride 102 CO2 24 BUN 32 creatinine 6.87 calcium 8.7 phosphorus 3.8 albumin 2.7 WBC 10.8 hemoglobin 8.4 platelets 256   Objective:  Vital signs in last 24 hours:  Temp:  [97.5 F (36.4 C)-98.3 F (36.8 C)] 97.5 F (36.4 C) (04/10 0709) Pulse Rate:  [64-77] 73 (04/10 0709) Resp:  [16-18] 18 (04/09 1949) BP: (94-162)/(53-77) 142/64 (04/10 0709) SpO2:  [96 %-100 %] 96 % (04/10 0709) Weight:  [89.1 kg] 89.1 kg (04/09 1051)  Weight change: -3.1 kg Filed Weights   12/04/18 1215 12/06/18 0645 12/06/18 1051  Weight: 88.6 kg 92.2 kg 89.1 kg    Intake/Output: I/O last 3 completed shifts: In: 120 [P.O.:120] Out: 1500 [Other:1500]   Intake/Output this shift:  No intake/output data recorded. Lying comfortably in bed no distress CVS-regular rate and rhythm RS- CTA no wheezes or rales ABD-  BS present soft non-distended EXT- no edema   left AV graft with bruit   Basic Metabolic Panel: Recent Labs  Lab 12/01/18 1027 12/03/18 0814 12/04/18 0306 12/06/18 0654  NA 137 136 135 138  K 4.6 3.3* 3.5 3.5  CL 98 99 102 102  CO2 21* 24 22 24   GLUCOSE 226* 214* 193* 167*  BUN 21 30* 36* 32*  CREATININE 5.35* 5.81* 6.47* 6.86*  CALCIUM 9.3 9.0 8.5* 8.7*  PHOS  --   --   --  3.8    Liver Function Tests: Recent Labs  Lab 12/01/18 1027 12/06/18 0654  AST 37  --   ALT 26  --   ALKPHOS 63  --   BILITOT 1.4*  --   PROT 7.3  --   ALBUMIN 3.5 2.7*   No results for input(s): LIPASE, AMYLASE in the last 168 hours. No results for input(s): AMMONIA in the last 168 hours.  CBC: Recent Labs  Lab 12/01/18 1027 12/03/18 0814 12/04/18 0306 12/06/18 0654  WBC 11.5* 9.9 10.2 10.8*  NEUTROABS 9.1*  --   --   --   HGB 10.6* 9.5* 8.7* 8.4*  HCT 34.6* 30.1* 27.1* 26.0*  MCV 90.8 90.7 89.7 90.0  PLT 289 256 251 256    Cardiac Enzymes: Recent Labs  Lab 12/01/18 1027 12/01/18 1332 12/01/18 2019 12/02/18 0213  TROPONINI <0.03 <0.03 <0.03 <0.03    BNP: Invalid input(s): POCBNP  CBG: Recent Labs  Lab 12/06/18 0628 12/06/18 1254 12/06/18 1655 12/06/18 2255 12/07/18 0634  GLUCAP  175* 30* 5* 12* 63*    Microbiology: Results for orders placed or performed during the hospital encounter of 12/01/18  MRSA PCR Screening     Status: None   Collection Time: 12/03/18  8:50 PM  Result Value Ref Range Status   MRSA by PCR NEGATIVE NEGATIVE Final    Comment:        The GeneXpert MRSA Assay (FDA approved for NASAL specimens only), is one component of a comprehensive MRSA colonization surveillance program. It is not intended to diagnose MRSA infection nor to guide or monitor treatment for MRSA infections. Performed at [facility], [place] [address], [place], [place] [address]     Coagulation Studies: No results for input(s): LABPROT, INR in the last 72  hours.  Urinalysis: No results for input(s): COLORURINE, LABSPEC, PHURINE, GLUCOSEU, HGBUR, BILIRUBINUR, KETONESUR, PROTEINUR, UROBILINOGEN, NITRITE, LEUKOCYTESUR in the last 72 hours.  Invalid input(s): APPERANCEUR    Imaging: No results found.   Medications:   . sodium chloride     . aspirin EC  81 mg Oral Daily  . cholecalciferol  5,000 Units Oral Daily  . darbepoetin (ARANESP) injection - DIALYSIS  40 mcg Intravenous Q Tue-HD  . dorzolamide  1 drop Both Eyes BID  . FLUoxetine  20 mg Oral Daily  . gabapentin  300 mg Oral Daily  . heparin  5,000 Units Subcutaneous Q8H  . insulin aspart  0-5 Units Subcutaneous QHS  . insulin aspart  0-9 Units Subcutaneous TID WC  . insulin detemir  14 Units Subcutaneous Daily  . latanoprost  1 drop Both Eyes QHS  . midodrine  10 mg Oral BID WC  . multivitamin  1 tablet Oral QHS  . pantoprazole  40 mg Oral Daily  . rosuvastatin  40 mg Oral QHS  . sodium chloride flush  3 mL Intravenous Q12H  . ticagrelor  90 mg Oral BID  . vitamin B-12  1,000 mcg Oral Daily   sodium chloride, acetaminophen, albuterol, nitroGLYCERIN, ondansetron (ZOFRAN) IV, sodium chloride flush  Assessment/ Plan:   Chest pain history of cardiomyopathy, plan for cardiac catheterization 12/03/2018 appreciate assistance from Dr. Gwenlyn Found with PCI and drug-eluting stent to RCA.  Last 2D echo 09/10/2018 EF 35 to 40%.  Aortic valve sclerotic without stenosis trivial mitral regurgitation.  Patient has been started on Brilinta 90 mg twice daily  End-stage renal disease with dialysis Tuesday Thursday Saturday.  Dialysis was performed 12/06/2018.  If patient remains in the hospital she will get dialysis for 06/2019  Hypertension blood pressure much better off amlodipine and Lasix.  Coreg has been discontinued too due to orthostatic hypotension.  She seems to be much better today  Volume appears to be stable with clear lung fields to examination.  Anemia stable slight decrease in  hemoglobin, iron saturations appear to be adequate.  She is started on darbepoetin.  40 mcg q. Tuesday  Metabolic bone disease no VDRA at present  Diabetes mellitus as per primary team.  Recent increase in Lantus  Hyperlipidemia continues on Crestor  Disposition appears to be stable for discharge from renal standpoint.   LOS: [place] @TODAY @8 :38 AM

## 2018-12-07 NOTE — Care Management Important Message (Signed)
Important Message  Patient Details  Name: Priscilla Davis MRN: [id number] Date of Birth: 05-Jun-1951   Medicare Important Message Given:  Yes    Orbie Pyo 12/07/2018, 2:42 PM

## 2018-12-07 NOTE — Progress Notes (Addendum)
Orthostatic vital signs completed on patient. Significant drop in blood pressures noted from lying to sitting position and another significant drop from sitting to standing. During BP check while standing, patient started swaying back and forth and had difficulty maintaining her upright posture. When asked if she is ok, patient replied, "I am very foggy". Patient also affirms that she is lightheaded. Patient assisted back to a supine position in bed. Bedside RN notified. Patient is unable to do the 3 minute standing test at this time.

## 2018-12-07 NOTE — Progress Notes (Signed)
PROGRESS NOTE    Priscilla Davis  ZDG:[id number]  DOB: 10-23-1950  DOA: 12/01/2018 PCP: Kristen Loader, FNP  Brief Narrative: 68 year old female with end-stage renal disease on hemodialysis TTS (initiated on November 15, 2018), who was admitted in January for chest pain/acute CHF with echo showing EF 35 to 40% but did not undergo cardiac cath at that time and concern for renal failure/corona pandemic, presented now with chest pain and admitted for cardiology evaluation on April 4.  Patient seen by cardiology and underwent cardiac cath on April 6 showing obstructive CAD and required PCI to RCA.  Nephrology has been following along and she underwent dialysis yesterday per schedule.  Hospital course complicated by postdialysis hypotension, orthostasis, dizziness and resultant ataxia.  Subjective: Patient denies any chest pain.  Tolerating antiplatelet agents without any signs of bleeding.  She continues to complain of dizziness on standing or walking.  Objective: Vitals:   12/07/18 0709 12/07/18 1116 12/07/18 1118 12/07/18 1120  BP: (!) 142/64 (!) 171/65 137/68 114/67  Pulse: 73 69 73 79  Resp:      Temp: (!) 97.5 F (36.4 C)     TempSrc: Oral     SpO2: 96% 95% 98% 98%  Weight:      Height:        Intake/Output Summary (Last 24 hours) at 12/07/2018 1541 Last data filed at 12/06/2018 1926 Gross per 24 hour  Intake 120 ml  Output -  Net 120 ml   Filed Weights   12/04/18 1215 12/06/18 0645 12/06/18 1051  Weight: 88.6 kg 92.2 kg 89.1 kg    Physical Examination:  General exam: Appears calm and comfortable  Respiratory system: Clear to auscultation. Respiratory effort normal. Cardiovascular system: S1 & S2 heard, RRR. No JVD, murmurs, rubs, gallops or clicks. No pedal edema. Gastrointestinal system: Abdomen is nondistended, soft and nontender. No organomegaly or masses felt. Normal bowel sounds heard. Central nervous system: Alert and oriented. No focal neurological deficits. Extremities:  Symmetric 5 x 5 power. Skin: No rashes, lesions or ulcers Psychiatry: Judgement and insight appear normal. Mood & affect appropriate.     Data Reviewed: I have personally reviewed following labs and imaging studies  CBC: Recent Labs  Lab 12/01/18 1027 12/03/18 0814 12/04/18 0306 12/06/18 0654  WBC 11.5* 9.9 10.2 10.8*  NEUTROABS 9.1*  --   --   --   HGB 10.6* 9.5* 8.7* 8.4*  HCT 34.6* 30.1* 27.1* 26.0*  MCV 90.8 90.7 89.7 90.0  PLT 289 256 251 951   Basic Metabolic Panel: Recent Labs  Lab 12/01/18 1027 12/03/18 0814 12/04/18 0306 12/06/18 0654  NA 137 136 135 138  K 4.6 3.3* 3.5 3.5  CL 98 99 102 102  CO2 21* 24 22 24   GLUCOSE 226* 214* 193* 167*  BUN 21 30* 36* 32*  CREATININE 5.35* 5.81* 6.47* 6.86*  CALCIUM 9.3 9.0 8.5* 8.7*  PHOS  --   --   --  3.8   GFR: Estimated Creatinine Clearance: 9.2 mL/min (A) (by C-G formula based on SCr of 6.86 mg/dL (H)). Liver Function Tests: Recent Labs  Lab 12/01/18 1027 12/06/18 0654  AST 37  --   ALT 26  --   ALKPHOS 63  --   BILITOT 1.4*  --   PROT 7.3  --   ALBUMIN 3.5 2.7*   No results for input(s): LIPASE, AMYLASE in the last 168 hours. No results for input(s): AMMONIA in the last 168 hours. Coagulation Profile: No results for  input(s): INR, PROTIME in the last 168 hours. Cardiac Enzymes: Recent Labs  Lab 12/01/18 1027 12/01/18 1332 12/01/18 2019 12/02/18 0213  TROPONINI <0.03 <0.03 <0.03 <0.03   BNP (last 3 results) No results for input(s): PROBNP in the last 8760 hours. HbA1C: No results for input(s): HGBA1C in the last 72 hours. CBG: Recent Labs  Lab 12/06/18 1254 12/06/18 1655 12/06/18 2255 12/07/18 0634 12/07/18 1134  GLUCAP 208* 216* 169* 137* 231*   Lipid Profile: No results for input(s): CHOL, HDL, LDLCALC, TRIG, CHOLHDL, LDLDIRECT in the last 72 hours. Thyroid Function Tests: No results for input(s): TSH, T4TOTAL, FREET4, T3FREE, THYROIDAB in the last 72 hours. Anemia Panel: No  results for input(s): VITAMINB12, FOLATE, FERRITIN, TIBC, IRON, RETICCTPCT in the last 72 hours. Sepsis Labs: No results for input(s): PROCALCITON, LATICACIDVEN in the last 168 hours.  Recent Results (from the past 240 hour(s))  MRSA PCR Screening     Status: None   Collection Time: 12/03/18  8:50 PM  Result Value Ref Range Status   MRSA by PCR NEGATIVE NEGATIVE Final    Comment:        The GeneXpert MRSA Assay (FDA approved for NASAL specimens only), is one component of a comprehensive MRSA colonization surveillance program. It is not intended to diagnose MRSA infection nor to guide or monitor treatment for MRSA infections. Performed at [facility], [place] [address], [place], [place] [address]       Radiology Studies: No results found.      Scheduled Meds: . aspirin EC  81 mg Oral Daily  . Chlorhexidine Gluconate Cloth  6 each Topical Q0600  . cholecalciferol  5,000 Units Oral Daily  . darbepoetin (ARANESP) injection - DIALYSIS  40 mcg Intravenous Q Tue-HD  . dorzolamide  1 drop Both Eyes BID  . FLUoxetine  20 mg Oral Daily  . gabapentin  300 mg Oral Daily  . heparin  5,000 Units Subcutaneous Q8H  . insulin aspart  0-5 Units Subcutaneous QHS  . insulin aspart  0-9 Units Subcutaneous TID WC  . insulin detemir  14 Units Subcutaneous Daily  . latanoprost  1 drop Both Eyes QHS  . midodrine  10 mg Oral BID WC  . multivitamin  1 tablet Oral QHS  . pantoprazole  40 mg Oral Daily  . rosuvastatin  40 mg Oral QHS  . sodium chloride flush  3 mL Intravenous Q12H  . ticagrelor  90 mg Oral BID  . vitamin B-12  1,000 mcg Oral Daily   Continuous Infusions: . sodium chloride      Assessment & Plan:    1. Orthostatic hypotension: Secondary to venous stasis/volume depletion.  Patient off amlodipine, Lasix and Coreg.  Midodrin started yesterday and patient feels somewhat better but still very dizzy on standing or walking.  Support stockings placed and patient received  small IV fluid bolus (250 mL) in anticipation of discharge but still unable to walk without dizziness.  Repeat orthostatics after Midodrin/support stockings/IV fluid bolus include laying systolic blood pressure in 160s, sitting in 140s and standing blood pressure 118/58.  Patient scheduled for dialysis tomorrow again.  She is reluctant to go home feeling dizzy.  Will watch another day.  Appreciate renal and cardiology evaluation/follow-up.  2.  CAD/acute coronary syndrome: Present on admission.  Status post left heart cath on April 6 showing high-grade distal stenosis of RCA requiring PCI.  Aspirin and Brilinta for 12 months along with high-dose statins.  Coreg discontinued and concern for  problem #1  3.  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  Volume being managed with dialysis/ultrafiltration.  Patient hypotensive after dialysis yesterday.  Renal to adjust ultrafiltration per blood pressure tolerance.  Cannot tolerate ACE inhibitors or beta-blockers in concern for problem #1  4.  Hypertension: Patient currently has supine hypertension with systolic in 993T and orthostatic hypotension with systolic blood pressure dropping to 110s.  As she is symptomatic with orthostasis, holding oral antihypertensives/diuretics at this time.  5.  Hyperlipidemia: Statins  6.  Diabetes mellitus: Blood glucose 1 30-230.  Currently on Lantus 14 units.  7.  End-stage renal disease : Management per nephrology.  Hemodialysis scheduled for Tuesday Thursday Saturday.  Watch for postdialysis hypotension.  She did receive small IV fluid bolus this afternoon.  Tolerated well with no signs of volume overload.  8.  Anemia of chronic disease: Nephrology started patient on weekly darbepoetin  DVT prophylaxis: Heparin Code Status: Full code Family / Patient Communication: Discussed with patient.  Disposition Plan: Home when dizziness resolves, cleared by PT.     LOS: 5 days    Time spent: 35 MIN     [place]ford Shi, MD [facility] Pager [phone]  If 7PM-7AM, please contact night-coverage [email or web address] Password [facility] 12/07/2018, 3:41 PM

## 2018-12-07 NOTE — Progress Notes (Signed)
Post bolus orthostatic BP check: Lying 160/63 (91) Sitting 141/67 (88) Standing 118/58  With patient feeling light headed/dizzy.   Primary MD made aware.

## 2018-12-08 LAB — RENAL FUNCTION PANEL
Albumin: 2.7 g/dL — ABNORMAL LOW (ref 3.5–5.0)
Anion gap: 15 (ref 5–15)
BUN: 37 mg/dL — ABNORMAL HIGH (ref 8–23)
CO2: 21 mmol/L — ABNORMAL LOW (ref 22–32)
Calcium: 8.7 mg/dL — ABNORMAL LOW (ref 8.9–10.3)
Chloride: 101 mmol/L (ref 98–111)
Creatinine, Ser: 6.06 mg/dL — ABNORMAL HIGH (ref 0.44–1.00)
GFR calc Af Amer: 8 mL/min — ABNORMAL LOW (ref >60)
GFR calc non Af Amer: 7 mL/min — ABNORMAL LOW (ref >60)
Glucose, Bld: 280 mg/dL — ABNORMAL HIGH (ref 70–99)
Phosphorus: 4.1 mg/dL (ref 2.5–4.6)
Potassium: 3.7 mmol/L (ref 3.5–5.1)
Sodium: 137 mmol/L (ref 135–145)

## 2018-12-08 LAB — GLUCOSE, CAPILLARY
Glucose-Capillary: 102 mg/dL — ABNORMAL HIGH (ref 70–99)
Glucose-Capillary: 127 mg/dL — ABNORMAL HIGH (ref 70–99)
Glucose-Capillary: 302 mg/dL — ABNORMAL HIGH (ref 70–99)

## 2018-12-08 LAB — CBC
HCT: 28.5 % — ABNORMAL LOW (ref 36.0–46.0)
Hemoglobin: 9 g/dL — ABNORMAL LOW (ref 12.0–15.0)
MCH: 28.5 pg (ref 26.0–34.0)
MCHC: 31.6 g/dL (ref 30.0–36.0)
MCV: 90.2 fL (ref 80.0–100.0)
Platelets: 323 10*3/uL (ref 150–400)
RBC: 3.16 MIL/uL — ABNORMAL LOW (ref 3.87–5.11)
RDW: 15.2 % (ref 11.5–15.5)
WBC: 9.9 10*3/uL (ref 4.0–10.5)
nRBC: 0 % (ref 0.0–0.2)

## 2018-12-08 MED ORDER — FLUDROCORTISONE ACETATE 0.1 MG PO TABS
0.1000 mg | ORAL_TABLET | Freq: Every day | ORAL | Status: DC
  Administered 2018-12-08 – 2018-12-12 (×5): 0.1 mg via ORAL
  Filled 2018-12-08 (×5): qty 1

## 2018-12-08 MED ORDER — LIDOCAINE-PRILOCAINE 2.5-2.5 % EX CREA: 1.0000 "application " | TOPICAL_CREAM | CUTANEOUS | Status: DC | PRN

## 2018-12-08 MED ORDER — SODIUM CHLORIDE 0.9 % IV SOLN: 100.0000 mL | INTRAVENOUS | Status: DC | PRN

## 2018-12-08 MED ORDER — PENTAFLUOROPROP-TETRAFLUOROETH EX AERO: 1.0000 "application " | INHALATION_SPRAY | CUTANEOUS | Status: DC | PRN

## 2018-12-08 MED ORDER — LIDOCAINE HCL (PF) 1 % IJ SOLN: 5.0000 mL | INTRAMUSCULAR | Status: DC | PRN

## 2018-12-08 MED ORDER — ALTEPLASE 2 MG IJ SOLR: 2.0000 mg | Freq: Once | INTRAMUSCULAR | Status: DC | PRN

## 2018-12-08 MED ORDER — HEPARIN SODIUM (PORCINE) 1000 UNIT/ML DIALYSIS: 1000.0000 [IU] | INTRAMUSCULAR | Status: DC | PRN

## 2018-12-08 NOTE — Progress Notes (Signed)
Orthostatic BP check: Lying  163/70 (96) Sitting 143/62 (86) Standing 108/60 (75) with pt c/p of lightheaded and wobbly. Took pt for a walk. Pt was able to ambulate ~173ft with a walker. Pt complained about being tired and this was the first time she walk since this admission. Will probably need more rehab to get her strength back again.

## 2018-12-08 NOTE — Progress Notes (Signed)
[facility] ROUNDING NOTE   Subjective:   This is a very pleasant 68 year old lady that was admitted with chest pain.  She is end-stage renal disease with a recent stopped on dialysis her first dialysis treatment was 11/15/2018.  She dialyzes Tuesday Thursday Saturday.  Cardiac catheterization status post drug-eluting stent to the proximal RCA 12/03/2018 appreciate assistance from Dr. Gwenlyn Found.  She has been orthostatic and Coreg has been discontinued by Dr. Jonnie Finner (appreciate assistance) we will also try to limit amount of fluid that we ultrafiltrate with dialysis.  She underwent successful dialysis treatment 12/06/2018 there was the ultrafiltration of 1.5 L.  Plans are for dialysis 12/08/2018  Blood pressure 114/60 standing at 3 minutes 105/88 standing at 0 minutes Blood pressure 138/83 lying and 131/90 sitting  dialysis with removal of 1.5 L 12/06/2018  Aspirin 81 mg daily Midodrine 10mg  bid   Crestor 40 mg daily Prozac 20 mg daily insulin sliding scale, Levemir 14 units subcu, Protonix 40 mg daily vitamin B12 1000 mcg daily, Neurontin 300 mg daily, Rena-Vite 1 daily, cholecalciferol 5000 units daily,.  Aranesp 40 mcg 12/04/2018, Brilinta 90 mg daily twice daily  Labs from 12/06/2018.  Sodium 138 potassium 3.5 chloride 102 CO2 24 BUN 32 creatinine 6.87 calcium 8.7 phosphorus 3.8 albumin 2.7 WBC 10.8 hemoglobin 8.4 platelets 256.  Labs pending 12/08/2018   Objective:  Vital signs in last 24 hours:  Temp:  [98 F (36.7 C)] 98 F (36.7 C) (04/10 1930) Pulse Rate:  [69-79] 74 (04/10 1930) Resp:  [12] 12 (04/10 1930) BP: (114-171)/(63-75) 157/75 (04/10 2229) SpO2:  [95 %-100 %] 100 % (04/10 1930)  Weight change:  Filed Weights   12/04/18 1215 12/06/18 0645 12/06/18 1051  Weight: 88.6 kg 92.2 kg 89.1 kg    Intake/Output: I/O last 3 completed shifts: In: 363 [P.O.:360; I.V.:3] Out: -    Intake/Output this shift:  No intake/output data recorded. Lying comfortably in bed no  distress CVS-regular rate and rhythm RS- CTA no wheezes or rales ABD- BS present soft non-distended EXT- no edema   left AV graft with bruit   Basic Metabolic Panel: Recent Labs  Lab 12/01/18 1027 12/03/18 0814 12/04/18 0306 12/06/18 0654  NA 137 136 135 138  K 4.6 3.3* 3.5 3.5  CL 98 99 102 102  CO2 21* 24 22 24   GLUCOSE 226* 214* 193* 167*  BUN 21 30* 36* 32*  CREATININE 5.35* 5.81* 6.47* 6.86*  CALCIUM 9.3 9.0 8.5* 8.7*  PHOS  --   --   --  3.8    Liver Function Tests: Recent Labs  Lab 12/01/18 1027 12/06/18 0654  AST 37  --   ALT 26  --   ALKPHOS 63  --   BILITOT 1.4*  --   PROT 7.3  --   ALBUMIN 3.5 2.7*   No results for input(s): LIPASE, AMYLASE in the last 168 hours. No results for input(s): AMMONIA in the last 168 hours.  CBC: Recent Labs  Lab 12/01/18 1027 12/03/18 0814 12/04/18 0306 12/06/18 0654  WBC 11.5* 9.9 10.2 10.8*  NEUTROABS 9.1*  --   --   --   HGB 10.6* 9.5* 8.7* 8.4*  HCT 34.6* 30.1* 27.1* 26.0*  MCV 90.8 90.7 89.7 90.0  PLT 289 256 251 256    Cardiac Enzymes: Recent Labs  Lab 12/01/18 1027 12/01/18 1332 12/01/18 2019 12/02/18 0213  TROPONINI <0.03 <0.03 <0.03 <0.03    BNP: Invalid input(s): POCBNP  CBG: Recent Labs  Lab  12/07/18 0634 12/07/18 1134 12/07/18 1557 12/07/18 2115 12/08/18 0559  GLUCAP 137* 231* 239* 159* 102*    Microbiology: Results for orders placed or performed during the hospital encounter of 12/01/18  MRSA PCR Screening     Status: None   Collection Time: 12/03/18  8:50 PM  Result Value Ref Range Status   MRSA by PCR NEGATIVE NEGATIVE Final    Comment:        The GeneXpert MRSA Assay (FDA approved for NASAL specimens only), is one component of a comprehensive MRSA colonization surveillance program. It is not intended to diagnose MRSA infection nor to guide or monitor treatment for MRSA infections. Performed at [facility], [place] [address] [place], [place] [address]      Coagulation Studies: No results for input(s): LABPROT, INR in the last 72 hours.  Urinalysis: No results for input(s): COLORURINE, LABSPEC, PHURINE, GLUCOSEU, HGBUR, BILIRUBINUR, KETONESUR, PROTEINUR, UROBILINOGEN, NITRITE, LEUKOCYTESUR in the last 72 hours.  Invalid input(s): APPERANCEUR    Imaging: No results found.   Medications:   . sodium chloride     . aspirin EC  81 mg Oral Daily  . Chlorhexidine Gluconate Cloth  6 each Topical Q0600  . cholecalciferol  5,000 Units Oral Daily  . darbepoetin (ARANESP) injection - DIALYSIS  40 mcg Intravenous Q Tue-HD  . dorzolamide  1 drop Both Eyes BID  . FLUoxetine  20 mg Oral Daily  . gabapentin  300 mg Oral Daily  . heparin  5,000 Units Subcutaneous Q8H  . insulin aspart  0-5 Units Subcutaneous QHS  . insulin aspart  0-9 Units Subcutaneous TID WC  . insulin detemir  14 Units Subcutaneous Daily  . latanoprost  1 drop Both Eyes QHS  . midodrine  10 mg Oral BID WC  . multivitamin  1 tablet Oral QHS  . pantoprazole  40 mg Oral Daily  . rosuvastatin  40 mg Oral QHS  . sodium chloride flush  3 mL Intravenous Q12H  . ticagrelor  90 mg Oral BID  . vitamin B-12  1,000 mcg Oral Daily   sodium chloride, acetaminophen, albuterol, nitroGLYCERIN, ondansetron (ZOFRAN) IV, sodium chloride flush  Assessment/ Plan:   Chest pain history of cardiomyopathy, plan for cardiac catheterization 12/03/2018 appreciate assistance from Dr. Gwenlyn Found with PCI and drug-eluting stent to RCA.  Last 2D echo 09/10/2018 EF 35 to 40%.  Aortic valve sclerotic without stenosis trivial mitral regurgitation.  Patient has been started on Brilinta 90 mg twice daily  End-stage renal disease with dialysis Tuesday Thursday Saturday.  Dialysis was performed 12/06/2018.  Continue to follow she will get dialysis 12/08/2018  Hypertension blood pressure much better off amlodipine and Lasix.  Coreg has been discontinued too due to orthostatic hypotension.  She seems to be much better  today  Volume appears to be stable with clear lung fields to examination.  Anemia stable slight decrease in hemoglobin, iron saturations appear to be adequate.  She is started on darbepoetin.  40 mcg q. Tuesday  Metabolic bone disease no VDRA at present  Diabetes mellitus as per primary team.  Recent increase in Lantus  Hyperlipidemia continues on Crestor  Disposition appears to be stable for discharge from renal standpoint.   LOS: [place] @TODAY @8 :31 AM

## 2018-12-08 NOTE — Progress Notes (Signed)
PROGRESS NOTE    Priscilla Davis  FIE:[id number]  DOB: 07-08-1951  DOA: 12/01/2018 PCP: Kristen Loader, FNP  Brief Narrative: 68 year old female with end-stage renal disease on hemodialysis TTS (initiated on November 15, 2018), who was admitted in January for chest pain/acute CHF with echo showing EF 35 to 40% but did not undergo cardiac cath at that time and concern for renal failure/corona pandemic, presented now with chest pain and admitted for cardiology evaluation on April 4.  Patient seen by cardiology and underwent cardiac cath on April 6 showing obstructive CAD and required PCI to RCA.  Nephrology has been following along and she underwent dialysis yesterday per schedule.  Hospital course complicated by postdialysis hypotension, orthostasis, dizziness and resultant ataxia.  Subjective: Patient denies any chest pain. She continues to complain of dizziness on standing or walking.  She has support stockings on.  Orthostatic blood pressure checks obtained personally by me in the presence of bedside nurse.  Patient's laying blood pressure in systolic 166A.  After sitting up for couple of minutes, blood pressure reading was 136/68 and blood pressure check after standing for a minute was 108/70 and patient complained of feeling lightheaded, requested to sit down.  Objective: Vitals:   12/08/18 1230 12/08/18 1300 12/08/18 1330 12/08/18 1400  BP: 136/68 117/60 136/68 137/68  Pulse: 66 64 69 66  Resp:      Temp:      TempSrc:      SpO2:      Weight:      Height:        Intake/Output Summary (Last 24 hours) at 12/08/2018 1429 Last data filed at 12/08/2018 0300 Gross per 24 hour  Intake 243 ml  Output -  Net 243 ml   Filed Weights   12/06/18 0645 12/06/18 1051 12/08/18 1049  Weight: 92.2 kg 89.1 kg 91.3 kg    Physical Examination:  General exam: Appears calm and comfortable  Respiratory system: Clear to auscultation. Respiratory effort normal. Cardiovascular system: S1 & S2 heard, RRR.  No JVD, murmurs, rubs, gallops or clicks. No pedal edema. Gastrointestinal system: Abdomen is nondistended, soft and nontender. No organomegaly or masses felt. Normal bowel sounds heard. Central nervous system: Alert and oriented. No focal neurological deficits. Extremities: Symmetric 5 x 5 power. Skin: No rashes, lesions or ulcers Psychiatry: Judgement and insight appear normal. Mood & affect appropriate.     Data Reviewed: I have personally reviewed following labs and imaging studies  CBC: Recent Labs  Lab 12/03/18 0814 12/04/18 0306 12/06/18 0654 12/08/18 1116  WBC 9.9 10.2 10.8* 9.9  HGB 9.5* 8.7* 8.4* 9.0*  HCT 30.1* 27.1* 26.0* 28.5*  MCV 90.7 89.7 90.0 90.2  PLT 256 251 256 630   Basic Metabolic Panel: Recent Labs  Lab 12/03/18 0814 12/04/18 0306 12/06/18 0654 12/08/18 1114  NA 136 135 138 137  K 3.3* 3.5 3.5 3.7  CL 99 102 102 101  CO2 24 22 24  21*  GLUCOSE 214* 193* 167* 280*  BUN 30* 36* 32* 37*  CREATININE 5.81* 6.47* 6.86* 6.06*  CALCIUM 9.0 8.5* 8.7* 8.7*  PHOS  --   --  3.8 4.1   GFR: Estimated Creatinine Clearance: 10.5 mL/min (A) (by C-G formula based on SCr of 6.06 mg/dL (H)). Liver Function Tests: Recent Labs  Lab 12/06/18 0654 12/08/18 1114  ALBUMIN 2.7* 2.7*   No results for input(s): LIPASE, AMYLASE in the last 168 hours. No results for input(s): AMMONIA in the last 168 hours. Coagulation Profile:  No results for input(s): INR, PROTIME in the last 168 hours. Cardiac Enzymes: Recent Labs  Lab 12/01/18 2019 12/02/18 0213  TROPONINI <0.03 <0.03   BNP (last 3 results) No results for input(s): PROBNP in the last 8760 hours. HbA1C: No results for input(s): HGBA1C in the last 72 hours. CBG: Recent Labs  Lab 12/07/18 0634 12/07/18 1134 12/07/18 1557 12/07/18 2115 12/08/18 0559  GLUCAP 137* 231* 239* 159* 102*   Lipid Profile: No results for input(s): CHOL, HDL, LDLCALC, TRIG, CHOLHDL, LDLDIRECT in the last 72 hours. Thyroid  Function Tests: No results for input(s): TSH, T4TOTAL, FREET4, T3FREE, THYROIDAB in the last 72 hours. Anemia Panel: No results for input(s): VITAMINB12, FOLATE, FERRITIN, TIBC, IRON, RETICCTPCT in the last 72 hours. Sepsis Labs: No results for input(s): PROCALCITON, LATICACIDVEN in the last 168 hours.  Recent Results (from the past 240 hour(s))  MRSA PCR Screening     Status: None   Collection Time: 12/03/18  8:50 PM  Result Value Ref Range Status   MRSA by PCR NEGATIVE NEGATIVE Final    Comment:        The GeneXpert MRSA Assay (FDA approved for NASAL specimens only), is one component of a comprehensive MRSA colonization surveillance program. It is not intended to diagnose MRSA infection nor to guide or monitor treatment for MRSA infections. Performed at [facility], [place] [address], [place], [place] [address]       Radiology Studies: No results found.      Scheduled Meds: . aspirin EC  81 mg Oral Daily  . Chlorhexidine Gluconate Cloth  6 each Topical Q0600  . cholecalciferol  5,000 Units Oral Daily  . darbepoetin (ARANESP) injection - DIALYSIS  40 mcg Intravenous Q Tue-HD  . dorzolamide  1 drop Both Eyes BID  . FLUoxetine  20 mg Oral Daily  . gabapentin  300 mg Oral Daily  . heparin  5,000 Units Subcutaneous Q8H  . insulin aspart  0-5 Units Subcutaneous QHS  . insulin aspart  0-9 Units Subcutaneous TID WC  . insulin detemir  14 Units Subcutaneous Daily  . latanoprost  1 drop Both Eyes QHS  . midodrine  10 mg Oral BID WC  . multivitamin  1 tablet Oral QHS  . pantoprazole  40 mg Oral Daily  . rosuvastatin  40 mg Oral QHS  . sodium chloride flush  3 mL Intravenous Q12H  . ticagrelor  90 mg Oral BID  . vitamin B-12  1,000 mcg Oral Daily   Continuous Infusions: . sodium chloride    . sodium chloride    . sodium chloride      Assessment & Plan:    1. Orthostatic hypotension: Secondary to venous stasis/volume depletion.  Patient off amlodipine, Lasix  and Coreg.  Midodrin started 4/9 and patient feels somewhat better but still very dizzy on standing or walking.  Support stockings placed and patient received small IV fluid bolus (250 mL) in anticipation of discharge yesterday but still unable to walk without dizziness.  Repeat orthostatics after Midodrin/support stockings/IV fluid bolus as described above.  Patient scheduled for dialysis today: Ultrafiltrate volume being adjusted by renal.  She is too unsteady with dizziness and is at high fall risk at this time.  Reluctant to add Florinef given problem #3.  She will need PT evaluation/gait training with assistive device to prevent falls.  Will change support stockings to thigh-high (if available) and request rehab eval.  2.  CAD/acute coronary syndrome: Present on admission.  Status post left heart cath on April 6 showing high-grade distal stenosis of RCA requiring PCI.  Aspirin and Brilinta for 12 months along with high-dose statins.  Coreg discontinued and concern for problem #1  3.  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  Volume being managed with dialysis/ultrafiltration.  Patient hypotensive after dialysis yesterday.  Renal to adjust ultrafiltration per blood pressure tolerance.  Cannot tolerate ACE inhibitors or beta-blockers in concern for problem #1  4.  Hypertension: Patient currently has supine hypertension with systolic in 337O to 451Q and orthostatic hypotension with systolic blood pressure dropping to 110s.  As she is symptomatic with orthostasis, holding oral antihypertensives/diuretics at this time.  5.  Hyperlipidemia: Statins  6.  Diabetes mellitus: Blood glucose 1 30-230.  Currently on Lantus 14 units.  7.  End-stage renal disease : Management per nephrology.  Hemodialysis scheduled for Tuesday Thursday Saturday.  Watch for postdialysis hypotension.  She did receive small IV fluid bolus this afternoon.  Tolerated well with no signs of volume overload.  8.   Anemia of chronic disease: Nephrology started patient on weekly darbepoetin  DVT prophylaxis: Heparin Code Status: Full code Family / Patient Communication: Discussed with patient.  Disposition Plan: Home when dizziness resolves, cleared by PT.     LOS: 6 days    Time spent: 25 MIN    Guilford Shi, MD [facility] Pager [phone]  If 7PM-7AM, please contact night-coverage [email or web address] Password [facility] 12/08/2018, 2:29 PM

## 2018-12-08 NOTE — Progress Notes (Signed)
Progress Note  Patient Name: Priscilla Davis Date of Encounter: 12/08/2018  Primary Cardiologist: Skeet Latch, MD   Subjective   Continues to have dizzy spells.  Otherwise feeling well with no acute complaints.  Inpatient Medications    Scheduled Meds: . aspirin EC  81 mg Oral Daily  . Chlorhexidine Gluconate Cloth  6 each Topical Q0600  . cholecalciferol  5,000 Units Oral Daily  . darbepoetin (ARANESP) injection - DIALYSIS  40 mcg Intravenous Q Tue-HD  . dorzolamide  1 drop Both Eyes BID  . FLUoxetine  20 mg Oral Daily  . gabapentin  300 mg Oral Daily  . heparin  5,000 Units Subcutaneous Q8H  . insulin aspart  0-5 Units Subcutaneous QHS  . insulin aspart  0-9 Units Subcutaneous TID WC  . insulin detemir  14 Units Subcutaneous Daily  . latanoprost  1 drop Both Eyes QHS  . midodrine  10 mg Oral BID WC  . multivitamin  1 tablet Oral QHS  . pantoprazole  40 mg Oral Daily  . rosuvastatin  40 mg Oral QHS  . sodium chloride flush  3 mL Intravenous Q12H  . ticagrelor  90 mg Oral BID  . vitamin B-12  1,000 mcg Oral Daily   Continuous Infusions: . sodium chloride     PRN Meds: sodium chloride, acetaminophen, albuterol, nitroGLYCERIN, ondansetron (ZOFRAN) IV, sodium chloride flush   Vital Signs    Vitals:   12/07/18 2229 12/08/18 0917 12/08/18 0919 12/08/18 0923  BP: (!) 157/75 (!) 158/61 (!) 141/64 110/60  Pulse:      Resp:      Temp:      TempSrc:      SpO2:      Weight:      Height:        Intake/Output Summary (Last 24 hours) at 12/08/2018 1021 Last data filed at 12/08/2018 0300 Gross per 24 hour  Intake 243 ml  Output -  Net 243 ml   Last 3 Weights 12/06/2018 12/06/2018 12/04/2018  Weight (lbs) 196 lb 6.9 oz 203 lb 4.2 oz 195 lb 5.2 oz  Weight (kg) 89.1 kg 92.2 kg 88.6 kg      Telemetry     - Personally Reviewed  ECG    None new- Personally Reviewed  Physical Exam   GEN: Well nourished, well developed, in no acute distress  HEENT: normal  Neck: no  JVD, carotid bruits, or masses Cardiac: RRR; no murmurs, rubs, or gallops,no edema  Respiratory:  clear to auscultation bilaterally, normal work of breathing GI: soft, nontender, nondistended, + BS MS: no deformity or atrophy  Skin: warm and dry Neuro:  Strength and sensation are intact Psych: euthymic mood, full affect   Labs    Chemistry Recent Labs  Lab 12/01/18 1027 12/03/18 0814 12/04/18 0306 12/06/18 0654  NA 137 136 135 138  K 4.6 3.3* 3.5 3.5  CL 98 99 102 102  CO2 21* 24 22 24   GLUCOSE 226* 214* 193* 167*  BUN 21 30* 36* 32*  CREATININE 5.35* 5.81* 6.47* 6.86*  CALCIUM 9.3 9.0 8.5* 8.7*  PROT 7.3  --   --   --   ALBUMIN 3.5  --   --  2.7*  AST 37  --   --   --   ALT 26  --   --   --   ALKPHOS 63  --   --   --   BILITOT 1.4*  --   --   --  GFRNONAA 8* 7* 6* 6*  GFRAA 9* 8* 7* 6*  ANIONGAP 18* 13 11 12      Hematology Recent Labs  Lab 12/03/18 0814 12/04/18 0306 12/06/18 0654  WBC 9.9 10.2 10.8*  RBC 3.32* 3.02* 2.89*  HGB 9.5* 8.7* 8.4*  HCT 30.1* 27.1* 26.0*  MCV 90.7 89.7 90.0  MCH 28.6 28.8 29.1  MCHC 31.6 32.1 32.3  RDW 14.4 14.8 14.9  PLT 256 251 256    Cardiac Enzymes Recent Labs  Lab 12/01/18 1027 12/01/18 1332 12/01/18 2019 12/02/18 0213  TROPONINI <0.03 <0.03 <0.03 <0.03   No results for input(s): TROPIPOC in the last 168 hours.   BNP Recent Labs  Lab 12/01/18 1030  BNP 37.0     DDimer No results for input(s): DDIMER in the last 168 hours.   Radiology    No results found.  Cardiac Studies   CARDIAC CATH: 12/03/2018   Ost 2nd Mrg to 2nd Mrg lesion is 100% stenosed.  Ost 3rd Mrg lesion is 100% stenosed.  Prox RCA to Mid RCA lesion is 40% stenosed.  Dist RCA lesion is 95% stenosed.  A drug-eluting stent was successfully placed.  Post intervention, there is a 0% residual stenosis.  There is moderate left ventricular systolic dysfunction.  LV end diastolic pressure is mildly elevated.  The left ventricular  ejection fraction is 45-50% by visual estimate.      Intervention     Patient Profile     68 y.o. female with a hx of diabetes mellitus, hypertension, end-stage renal disease recently initiated on dialysiswho is being seen for the evaluation of dyspnea and chest tightness. Patient was admitted January 2020 with chest tightness and CHF. Echocardiogram showed ejection fraction 35 to 40%, global hypokinesis, grade 1 diastolic dysfunction and moderate left atrial enlargement. Troponin was elevated but felt to be demand ischemia. Catheterization was avoided due to risk of contrast nephropathy. Nuclear study January 2020 showed ejection fraction 44%. There was a basilar inferior, inferolateral, anterolateral nonreversible defect suggestive of infarct but no ischemia. Patient recently started on hemodialysis 3 weeks ago. Dr. Oval Linsey had planned cardiac catheterization electively to evaluate cardiomyopathy once coronavirus pandemic improved. However, patient presented to the ED 12/01/18 with dyspnea, weakness and chest tightness. Cardiac cath 4/6 showed obstructive CAD as outlined above and pt underwent PCI to RCA.   Assessment & Plan   1. CAD: Status post left heart cath with occluded small second and third OM branches which appeared to be chronic.  RCA is codominant and had a high-grade stenosis with drug-eluting stent.  Ejection fraction of the time 45 to 50%.  Continue aspirin and Brilinta for 12 months.  Unable to tolerate beta-blocker and ACE/ARB due to hypotension.  2. Ischemic Cardiomyopathy: Left heart catheterization showed EF of 45 to 50%.  Unfortunately medical management limited by hypotension.  Volume per HD.     3. ESRD on HD: Dialysis per nephrology  4. HTN: Blood pressure has been low requiring Midrin.  Holding off on blood pressure medications at this time.  5. Lipids:  Continue Crestor.  Goal LDL less than 70.  6.  Dizziness: I have discussed this with the patient  that it sounds like this is likely due to orthostasis.  We Suella Cogar continue to hold antihypertensives.  Cardiology to see as needed through the weekend.   For questions or updates, please contact [place] Please consult [email or web address] for contact info under      Signed, Temia Debroux Meredith Leeds, MD  12/08/2018,  10:21 AM

## 2018-12-08 NOTE — Progress Notes (Signed)
PT Cancellation Note  Patient Details Name: Priscilla Davis MRN: [id number] DOB: 1951-04-20   Cancelled Treatment:    Reason Eval/Treat Not Completed: Patient at procedure or test/unavailable (HD).  Ellamae Sia, PT, DPT Acute Rehabilitation Services Pager [phone] Office [phone]    Willy Eddy 12/08/2018, 1:39 PM

## 2018-12-09 LAB — GLUCOSE, CAPILLARY
Glucose-Capillary: 134 mg/dL — ABNORMAL HIGH (ref 70–99)
Glucose-Capillary: 247 mg/dL — ABNORMAL HIGH (ref 70–99)
Glucose-Capillary: 175 mg/dL — ABNORMAL HIGH (ref 70–99)
Glucose-Capillary: 292 mg/dL — ABNORMAL HIGH (ref 70–99)

## 2018-12-09 MED ORDER — GABAPENTIN 100 MG PO CAPS: 100.0000 mg | ORAL_CAPSULE | Freq: Every day | ORAL | 1 refills | Status: AC

## 2018-12-09 MED ORDER — GABAPENTIN 100 MG PO CAPS
100.0000 mg | ORAL_CAPSULE | Freq: Every day | ORAL | Status: DC
  Administered 2018-12-10 – 2018-12-11 (×2): 100 mg via ORAL
  Filled 2018-12-09 (×2): qty 1

## 2018-12-09 MED ORDER — TICAGRELOR 90 MG PO TABS: 90.0000 mg | ORAL_TABLET | Freq: Two times a day (BID) | ORAL | 3 refills | Status: DC

## 2018-12-09 MED ORDER — NITROGLYCERIN 0.4 MG SL SUBL: 0.4000 mg | SUBLINGUAL_TABLET | SUBLINGUAL | 2 refills | Status: DC | PRN

## 2018-12-09 MED ORDER — MIDODRINE HCL 10 MG PO TABS: 10.0000 mg | ORAL_TABLET | Freq: Two times a day (BID) | ORAL | Status: DC

## 2018-12-09 NOTE — Progress Notes (Signed)
Pt still complaining of lightheaded and fogginess this morning, but was able to ambulate ~186ft with a walker with RN. Will continue to monitor pt.

## 2018-12-09 NOTE — Progress Notes (Signed)
[facility] ROUNDING NOTE   Subjective:   This is a very pleasant 68 year old lady that was admitted with chest pain.  She is end-stage renal disease with a recent stopped on dialysis her first dialysis treatment was 11/15/2018.  She dialyzes Tuesday Thursday Saturday.  Cardiac catheterization status post drug-eluting stent to the proximal RCA 12/03/2018 appreciate assistance from Dr. Gwenlyn Found.  She has been orthostatic and Coreg has been discontinued by Dr. Jonnie Finner (appreciate assistance) we will also try to limit amount of fluid that we ultrafiltrate with dialysis.  She underwent successful dialysis 12/08/2018 with removal of 1 L.  Blood pressure 139/65 lying 143/65 sitting 116/62  Standing    there was a low blood pressure on standing at 6:31 AM for 12/09/2018.  It appears that Mididrin helped resolve this issue.  She will need to be especially careful first thing in the morning on standing.  She may benefit from a girdle to wear around her waist.  Dialysis with removal of 1 L for 06/2019  Aspirin 81 mg daily Midodrine 10mg  bid   Crestor 40 mg daily Prozac 20 mg daily insulin sliding scale, Levemir 14 units subcu, Protonix 40 mg daily vitamin B12 1000 mcg daily, Neurontin 300 mg daily, Rena-Vite 1 daily, cholecalciferol 5000 units daily,.  Aranesp 40 mcg 12/04/2018, Brilinta 90 mg daily twice daily   Sodium 137 potassium 3.7 chloride 101 CO2 21 BUN 37 creatinine 6 glucose 280 calcium 8.7 phosphorus 4.1 albumin 2.7 WBC 9.9 hemoglobin 9.0 platelets 323  Objective:  Vital signs in last 24 hours:  Temp:  [97.9 F (36.6 C)-98.5 F (36.9 C)] 98 F (36.7 C) (04/12 0812) Pulse Rate:  [64-74] 74 (04/11 2000) Resp:  [14-16] 14 (04/12 0000) BP: (117-182)/(57-85) 139/65 (04/12 0812) SpO2:  [98 %-100 %] 98 % (04/12 0812) Weight:  [90.4 kg-91.3 kg] 90.4 kg (04/11 1502)  Weight change:  Filed Weights   12/06/18 1051 12/08/18 1049 12/08/18 1502  Weight: 89.1 kg 91.3 kg 90.4 kg     Intake/Output: I/O last 3 completed shifts: In: 51 [P.O.:240; I.V.:6] Out: 1000 [Other:1000]   Intake/Output this shift:  No intake/output data recorded. Lying comfortably in bed no distress CVS-regular rate and rhythm RS- CTA no wheezes or rales ABD- BS present soft non-distended EXT- no edema   left AV graft with bruit   Basic Metabolic Panel: Recent Labs  Lab 12/03/18 0814 12/04/18 0306 12/06/18 0654 12/08/18 1114  NA 136 135 138 137  K 3.3* 3.5 3.5 3.7  CL 99 102 102 101  CO2 24 22 24  21*  GLUCOSE 214* 193* 167* 280*  BUN 30* 36* 32* 37*  CREATININE 5.81* 6.47* 6.86* 6.06*  CALCIUM 9.0 8.5* 8.7* 8.7*  PHOS  --   --  3.8 4.1    Liver Function Tests: Recent Labs  Lab 12/06/18 0654 12/08/18 1114  ALBUMIN 2.7* 2.7*   No results for input(s): LIPASE, AMYLASE in the last 168 hours. No results for input(s): AMMONIA in the last 168 hours.  CBC: Recent Labs  Lab 12/03/18 0814 12/04/18 0306 12/06/18 0654 12/08/18 1116  WBC 9.9 10.2 10.8* 9.9  HGB 9.5* 8.7* 8.4* 9.0*  HCT 30.1* 27.1* 26.0* 28.5*  MCV 90.7 89.7 90.0 90.2  PLT 256 251 256 323    Cardiac Enzymes: No results for input(s): CKTOTAL, CKMB, CKMBINDEX, TROPONINI in the last 168 hours.  BNP: Invalid input(s): POCBNP  CBG: Recent Labs  Lab 12/07/18 2115 12/08/18 0559 12/08/18 1544 12/08/18 2026 12/09/18 9470  [place]    Microbiology: Results for orders placed or performed during the hospital encounter of 12/01/18  MRSA PCR Screening     Status: None   Collection Time: 12/03/18  8:50 PM  Result Value Ref Range Status   MRSA by PCR NEGATIVE NEGATIVE Final    Comment:        The GeneXpert MRSA Assay (FDA approved for NASAL specimens only), is one component of a comprehensive MRSA colonization surveillance program. It is not intended to diagnose MRSA infection nor to guide or monitor treatment for MRSA infections. Performed at [facility],  [place] [address], [place],  [address]     Coagulation Studies: No results for input(s): LABPROT, INR in the last 72 hours.  Urinalysis: No results for input(s): COLORURINE, LABSPEC, PHURINE, GLUCOSEU, HGBUR, BILIRUBINUR, KETONESUR, PROTEINUR, UROBILINOGEN, NITRITE, LEUKOCYTESUR in the last 72 hours.  Invalid input(s): APPERANCEUR    Imaging: No results found.   Medications:   . sodium chloride    . sodium chloride    . sodium chloride     . aspirin EC  81 mg Oral Daily  . Chlorhexidine Gluconate Cloth  6 each Topical Q0600  . cholecalciferol  5,000 Units Oral Daily  . darbepoetin (ARANESP) injection - DIALYSIS  40 mcg Intravenous Q Tue-HD  . dorzolamide  1 drop Both Eyes BID  . fludrocortisone  0.1 mg Oral Daily  . FLUoxetine  20 mg Oral Daily  . gabapentin  300 mg Oral Daily  . heparin  5,000 Units Subcutaneous Q8H  . insulin aspart  0-5 Units Subcutaneous QHS  . insulin aspart  0-9 Units Subcutaneous TID WC  . insulin detemir  14 Units Subcutaneous Daily  . latanoprost  1 drop Both Eyes QHS  . midodrine  10 mg Oral BID WC  . multivitamin  1 tablet Oral QHS  . pantoprazole  40 mg Oral Daily  . rosuvastatin  40 mg Oral QHS  . sodium chloride flush  3 mL Intravenous Q12H  . ticagrelor  90 mg Oral BID  . vitamin B-12  1,000 mcg Oral Daily   sodium chloride, sodium chloride, sodium chloride, acetaminophen, albuterol, alteplase, heparin, lidocaine (PF), lidocaine-prilocaine, nitroGLYCERIN, ondansetron (ZOFRAN) IV, pentafluoroprop-tetrafluoroeth, sodium chloride flush  Assessment/ Plan:   Chest pain history of cardiomyopathy, plan for cardiac catheterization 12/03/2018 appreciate assistance from Dr. Gwenlyn Found with PCI and drug-eluting stent to RCA.  Last 2D echo 09/10/2018 EF 35 to 40%.  Aortic valve sclerotic without stenosis trivial mitral regurgitation.  Patient has been started on Brilinta 90 mg twice daily  End-stage renal disease with dialysis Tuesday Thursday Saturday.   Dialysis performed 12/08/2018 with ultrafiltration of 1 L  Hypertension blood pressure much better off amlodipine and Lasix.  Coreg has been discontinued too due to orthostatic hypotension.  She seems to be much better today.  Recommend waist girdle during nighttime.  This may help offset some of the orthostatic hypotension noted first thing in the morning  Volume appears to be stable with clear lung fields to examination.  Anemia stable slight decrease in hemoglobin, iron saturations appear to be adequate.  She is started on darbepoetin.  40 mcg q. Tuesday  Metabolic bone disease no VDRA at present  Diabetes mellitus as per primary team.  Recent increase in Lantus  Hyperlipidemia continues on Crestor  Disposition appears to be stable for discharge from renal standpoint.   LOS: [place] @TODAY @9 :32 AM

## 2018-12-09 NOTE — Progress Notes (Addendum)
Physical Therapy Treatment Patient Details Name: Priscilla Davis MRN: [id number] DOB: 1950/09/21 Today's Date: 12/09/2018    History of Present Illness Priscilla Davis is a 68 y.o. female with medical history significant of ESRD recently started on hemodialysis through left AV fistula, cardiomyopathy, abnormal nuclear stress test, EF of 40%, hypertension and diabetes , recently quit a smoker who presents to the emergency room with worsening shortness of breath and chest discomfort for 3 weeks.  Patient had recent hospitalization, abnormal nuclear scan test, was seen outpatient and scheduled for cardiac cath, completed 4/6.    PT Comments    Pt reassessed at request of Dr. Earnest Conroy. Pt received sitting edge of bed, thigh high TED hose donned, where she had been for one hour prior. Sitting BP: 155/74 (97), Standing BP 99/57 (71). Subsequently sat back  down on edge of bed x 5 minutes, then proceeded to walk 200 feet with walker and supervision. BP post mobility 133/66. Pt with many concerns about going home and entering/exit her house for dialysis transportation on Tu/Th/Sa. Discussed discharge to rehab versus home. Pt was unsure at the moment and asked to think about it. Education re: home set up, donning of TED hose, abdominal binder (ordered for pt), monitoring symptoms, sitting on EOB and standing prior to walking, use of walker v cane for mobility, and arranging chairs along pathways in house for breaks as needed, sitting in shower vs standing. Pt with fairly good self awareness and monitoring of symptoms.   Addendum 1620: IP rehab screen placed after discussion with MD/RN   Follow Up Recommendations  Post Acute Rehab (pending improvement of sympomatic orthostatic hypotension)     Equipment Recommendations  Rolling walker with 5" wheels;3in1 (PT)    Recommendations for Other Services       Precautions / Restrictions Precautions Precautions: Fall Restrictions Weight Bearing Restrictions: No     Mobility  Bed Mobility               General bed mobility comments: Sitting EOB on arrival  Transfers Overall transfer level: Needs assistance Equipment used: Rolling walker (2 wheeled) Transfers: Sit to/from Stand Sit to Stand: Supervision            Ambulation/Gait Ambulation/Gait assistance: Supervision Gait Distance (Feet): 200 Feet Assistive device: Rolling walker (2 wheeled) Gait Pattern/deviations: Step-through pattern;Trunk flexed;Decreased stride length Gait velocity: slower   General Gait Details: cues for walker proximity. pt with slow, steady gait   Stairs             Wheelchair Mobility    Modified Rankin (Stroke Patients Only)       Balance Overall balance assessment: Needs assistance   Sitting balance-Leahy Scale: Good     Standing balance support: No upper extremity supported;Single extremity supported Standing balance-Leahy Scale: Fair                              Cognition Arousal/Alertness: Awake/alert Behavior During Therapy: WFL for tasks assessed/performed Overall Cognitive Status: Within Functional Limits for tasks assessed                                        Exercises      General Comments        Pertinent Vitals/Pain Pain Assessment: Faces Faces Pain Scale: No hurt    Home Living  Prior Function            PT Goals (current goals can now be found in the care plan section) Acute Rehab PT Goals Patient Stated Goal: ultimately home independent PT Goal Formulation: With patient Time For Goal Achievement: 12/19/18 Potential to Achieve Goals: Good Progress towards PT goals: Progressing toward goals    Frequency    Min 3X/week      PT Plan Equipment recommendations need to be updated    Co-evaluation              AM-PAC PT "6 Clicks" Mobility   Outcome Measure  Help needed turning from your back to your side while in a flat bed  without using bedrails?: None Help needed moving from lying on your back to sitting on the side of a flat bed without using bedrails?: None Help needed moving to and from a bed to a chair (including a wheelchair)?: A Little Help needed standing up from a chair using your arms (e.g., wheelchair or bedside chair)?: A Little Help needed to walk in hospital room?: A Little Help needed climbing 3-5 steps with a railing? : A Little 6 Click Score: 20    End of Session Equipment Utilized During Treatment: Gait belt Activity Tolerance: Patient tolerated treatment well Patient left: with call bell/phone within reach;in bed;Other (comment)(seated EOB) Nurse Communication: Mobility status PT Visit Diagnosis: Unsteadiness on feet (R26.81);Muscle weakness (generalized) (M62.81);Difficulty in walking, not elsewhere classified (R26.2)     Time: 8206-0156 PT Time Calculation (min) (ACUTE ONLY): 35 min  Charges:  $Gait Training: 8-22 mins $Self Care/Home Management: [place], PT, DPT Acute Rehabilitation Services Pager [phone] Office [phone]    Willy Eddy 12/09/2018, 12:39 PM

## 2018-12-09 NOTE — Progress Notes (Signed)
PROGRESS NOTE    Priscilla Davis  GYF:[id number]  DOB: [birth year]  DOA: 12/01/2018 PCP: Kristen Loader, FNP  Brief Narrative: 68 year old female with end-stage renal disease on hemodialysis TTS (initiated on November 15, 2018), who was admitted in January for chest pain/acute CHF with echo showing EF 35 to 40% but did not undergo cardiac cath at that time and concern for renal failure/corona pandemic, presented now with chest pain and admitted for cardiology evaluation on April 4.  Patient seen by cardiology and underwent cardiac cath on April 6 showing obstructive CAD and required PCI to RCA.  Nephrology has been following along and she underwent dialysis yesterday per schedule.  Hospital course complicated by postdialysis hypotension, orthostasis, dizziness and resultant ataxia.  Subjective: Patient worked with PT today . She states she cannot function at home feeling the way she does right now as she does not have 24/7 help. She states there is someone in the house at night but during the day. She now wants to consider rehab options as she cant manage going to dialysis treatments as outpatient.  Objective: Vitals:   12/09/18 0812 12/09/18 1305 12/09/18 1407 12/09/18 1421  BP: 139/65 140/62 (!) 179/69 (!) 124/56  Pulse:  73    Resp:  20    Temp: 98 F (36.7 C) 98 F (36.7 C)    TempSrc: Oral Oral    SpO2: 98% 100%    Weight:      Height:        Intake/Output Summary (Last 24 hours) at 12/09/2018 1628 Last data filed at 12/09/2018 1500 Gross per 24 hour  Intake 603 ml  Output -  Net 603 ml   Filed Weights   12/06/18 1051 12/08/18 1049 12/08/18 1502  Weight: 89.1 kg 91.3 kg 90.4 kg    Physical Examination:  General exam: Appears calm and comfortable  Respiratory system: Clear to auscultation. Respiratory effort normal. Cardiovascular system: S1 & S2 heard, RRR. No JVD, murmurs, rubs, gallops or clicks. No pedal edema. Gastrointestinal system: Abdomen is nondistended, soft and  nontender. No organomegaly or masses felt. Normal bowel sounds heard. Central nervous system: Alert and oriented. No focal neurological deficits. Extremities: Symmetric 5 x 5 power. Skin: No rashes, lesions or ulcers Psychiatry: Judgement and insight appear normal. Mood & affect appropriate.     Data Reviewed: I have personally reviewed following labs and imaging studies  CBC: Recent Labs  Lab 12/03/18 0814 12/04/18 0306 12/06/18 0654 12/08/18 1116  WBC 9.9 10.2 10.8* 9.9  HGB 9.5* 8.7* 8.4* 9.0*  HCT 30.1* 27.1* 26.0* 28.5*  MCV 90.7 89.7 90.0 90.2  PLT 256 251 256 916   Basic Metabolic Panel: Recent Labs  Lab 12/03/18 0814 12/04/18 0306 12/06/18 0654 12/08/18 1114  NA 136 135 138 137  K 3.3* 3.5 3.5 3.7  CL 99 102 102 101  CO2 24 22 24  21*  GLUCOSE 214* 193* 167* 280*  BUN 30* 36* 32* 37*  CREATININE 5.81* 6.47* 6.86* 6.06*  CALCIUM 9.0 8.5* 8.7* 8.7*  PHOS  --   --  3.8 4.1   GFR: Estimated Creatinine Clearance: 10.5 mL/min (A) (by C-G formula based on SCr of 6.06 mg/dL (H)). Liver Function Tests: Recent Labs  Lab 12/06/18 0654 12/08/18 1114  ALBUMIN 2.7* 2.7*   No results for input(s): LIPASE, AMYLASE in the last 168 hours. No results for input(s): AMMONIA in the last 168 hours. Coagulation Profile: No results for input(s): INR, PROTIME in the last 168 hours. Cardiac  Enzymes: No results for input(s): CKTOTAL, CKMB, CKMBINDEX, TROPONINI in the last 168 hours. BNP (last 3 results) No results for input(s): PROBNP in the last 8760 hours. HbA1C: No results for input(s): HGBA1C in the last 72 hours. CBG: Recent Labs  Lab 12/08/18 0559 12/08/18 1544 12/08/18 2026 12/09/18 0612 12/09/18 1103  GLUCAP 102* 127* 302* 134* 247*   Lipid Profile: No results for input(s): CHOL, HDL, LDLCALC, TRIG, CHOLHDL, LDLDIRECT in the last 72 hours. Thyroid Function Tests: No results for input(s): TSH, T4TOTAL, FREET4, T3FREE, THYROIDAB in the last 72 hours. Anemia  Panel: No results for input(s): VITAMINB12, FOLATE, FERRITIN, TIBC, IRON, RETICCTPCT in the last 72 hours. Sepsis Labs: No results for input(s): PROCALCITON, LATICACIDVEN in the last 168 hours.  Recent Results (from the past 240 hour(s))  MRSA PCR Screening     Status: None   Collection Time: 12/03/18  8:50 PM  Result Value Ref Range Status   MRSA by PCR NEGATIVE NEGATIVE Final    Comment:        The GeneXpert MRSA Assay (FDA approved for NASAL specimens only), is one component of a comprehensive MRSA colonization surveillance program. It is not intended to diagnose MRSA infection nor to guide or monitor treatment for MRSA infections. Performed at [facility], [place] [address] [place], [place] [address]       Radiology Studies: No results found.      Scheduled Meds: . aspirin EC  81 mg Oral Daily  . Chlorhexidine Gluconate Cloth  6 each Topical Q0600  . cholecalciferol  5,000 Units Oral Daily  . darbepoetin (ARANESP) injection - DIALYSIS  40 mcg Intravenous Q Tue-HD  . dorzolamide  1 drop Both Eyes BID  . fludrocortisone  0.1 mg Oral Daily  . FLUoxetine  20 mg Oral Daily  . gabapentin  300 mg Oral Daily  . heparin  5,000 Units Subcutaneous Q8H  . insulin aspart  0-5 Units Subcutaneous QHS  . insulin aspart  0-9 Units Subcutaneous TID WC  . insulin detemir  14 Units Subcutaneous Daily  . latanoprost  1 drop Both Eyes QHS  . midodrine  10 mg Oral BID WC  . multivitamin  1 tablet Oral QHS  . pantoprazole  40 mg Oral Daily  . rosuvastatin  40 mg Oral QHS  . sodium chloride flush  3 mL Intravenous Q12H  . ticagrelor  90 mg Oral BID  . vitamin B-12  1,000 mcg Oral Daily   Continuous Infusions: . sodium chloride    . sodium chloride    . sodium chloride      Assessment & Plan:    1. Orthostatic hypotension: Secondary to venous stasis/volume depletion.  Patient off amlodipine, Lasix and Coreg.  Midodrin started 4/9 and patient feels somewhat better but  still very dizzy on standing or walking.  Support stockings changed to thigh high but still unable to walk without dizziness.  Patient tolerated dialysis 4/11: Ultrafiltrate volume adjusted by renal. Persistent orthostasis after Midodrin/support stockings/IV fluid bolus.  Reluctant to add Florinef given problem #3. She is on Neurontin 300mg  daily for neuropathy but states her symptoms are not too bad and is willing to try lower dose at night.  She will need PT evaluation/gait training with assistive device to prevent falls. PT recommended Home PT with DME if she is declines rehab placement. Patient states she will talk to family but agreeable to acute rehab eval (consult request placed). Requested CM to follow up.   2.  CAD/acute coronary syndrome: Present on admission.  Status post left heart cath on April 6 showing high-grade distal stenosis of RCA requiring PCI.  Aspirin and Brilinta for 12 months along with high-dose statins.  Coreg discontinued and concern for problem #1  3.  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  Volume being managed with dialysis/ultrafiltration.  Patient hypotensive after dialysis yesterday.  Renal to adjust ultrafiltration per blood pressure tolerance.  Cannot tolerate ACE inhibitors or beta-blockers in concern for problem #1  4.  Hypertension: Patient currently has supine hypertension with systolic in 301S to 010X and orthostatic hypotension with systolic blood pressure dropping to 110s.  As she is symptomatic with orthostasis, holding oral antihypertensives/diuretics at this time.  5.  Hyperlipidemia: Statins  6.  Diabetes mellitus: Blood glucose 1 30-230.  Currently on Lantus 14 units.  7.  End-stage renal disease : Management per nephrology.  Hemodialysis scheduled for Tuesday Thursday Saturday.  Watch for postdialysis hypotension.  She did receive small IV fluid bolus this afternoon.  Tolerated well with no signs of volume overload.  8.  Anemia of  chronic disease: Nephrology started patient on weekly darbepoetin  DVT prophylaxis: Heparin Code Status: Full code Family / Patient Communication: Discussed with patient.  Disposition Plan: Home when dizziness resolves, cleared by PT.     LOS: 7 days    Time spent: 25 MIN    [place] Shi, MD [facility] Pager [phone]  If 7PM-7AM, please contact night-coverage [email or web address] Password [facility] 12/09/2018, 4:28 PM

## 2018-12-10 LAB — GLUCOSE, CAPILLARY
Glucose-Capillary: 192 mg/dL — ABNORMAL HIGH (ref 70–99)
Glucose-Capillary: 273 mg/dL — ABNORMAL HIGH (ref 70–99)
Glucose-Capillary: 160 mg/dL — ABNORMAL HIGH (ref 70–99)
Glucose-Capillary: 192 mg/dL — ABNORMAL HIGH (ref 70–99)

## 2018-12-10 MED ORDER — SODIUM CHLORIDE 0.9 % IV BOLUS
1000.0000 mL | Freq: Once | INTRAVENOUS | Status: AC
  Administered 2018-12-10: 1000 mL via INTRAVENOUS

## 2018-12-10 MED ORDER — CHLORHEXIDINE GLUCONATE CLOTH 2 % EX PADS
6.0000 | MEDICATED_PAD | Freq: Every day | CUTANEOUS | Status: DC
  Administered 2018-12-11 – 2018-12-12 (×2): 6 via TOPICAL

## 2018-12-10 NOTE — Progress Notes (Signed)
PROGRESS NOTE    Priscilla Davis  BWG:[id number]  DOB: 1951/01/03  DOA: 12/01/2018 PCP: Kristen Loader, FNP  Brief Narrative: 68 year old female with end-stage renal disease on hemodialysis TTS (initiated on November 15, 2018), who was admitted in January for chest pain/acute CHF with echo showing EF 35 to 40% but did not undergo cardiac cath at that time and concern for renal failure/corona pandemic, presented now with chest pain and admitted for cardiology evaluation on April 4.  Patient seen by cardiology and underwent cardiac cath on April 6 showing obstructive CAD and required PCI to RCA.  Nephrology has been following along and she underwent dialysis yesterday per schedule.  Hospital course complicated by postdialysis hypotension, orthostasis, dizziness and resultant ataxia.  Subjective: Patient  states she cannot function at home feeling the way she does right now as she does not have 24/7 help. She states there is someone in the house at night but no one during the day. She now wants to consider rehab options as she cant manage going to dialysis treatments as outpatient. She is frustrated with her current condition. She apparently was living part of the year in [place] and partly in [place] before recent initiation of dialysis and states would have had more family support in [place].   Objective: Vitals:   12/09/18 2225 12/10/18 0620 12/10/18 0711 12/10/18 1149  BP:  (!) 150/70 115/75 (!) 183/68  Pulse:  73 69   Resp:   18 18  Temp: 98 F (36.7 C) 97.7 F (36.5 C) 98 F (36.7 C) 97.8 F (36.6 C)  TempSrc: Oral Oral Oral Oral  SpO2:      Weight:      Height:        Intake/Output Summary (Last 24 hours) at 12/10/2018 1508 Last data filed at 12/10/2018 1300 Gross per 24 hour  Intake 917 ml  Output 1000 ml  Net -83 ml   Filed Weights   12/06/18 1051 12/08/18 1049 12/08/18 1502  Weight: 89.1 kg 91.3 kg 90.4 kg    Physical Examination:  General exam: Appears calm and  comfortable  Respiratory system: Clear to auscultation. Respiratory effort normal. Cardiovascular system: S1 & S2 heard, RRR. No JVD, murmurs, rubs, gallops or clicks. No pedal edema. Gastrointestinal system: Abdomen is nondistended, soft and nontender. No organomegaly or masses felt. Normal bowel sounds heard. Central nervous system: Alert and oriented. No focal neurological deficits. Extremities: Symmetric 5 x 5 power. Skin: No rashes, lesions or ulcers Psychiatry: Judgement and insight appear normal. Mood & affect appropriate.     Data Reviewed: I have personally reviewed following labs and imaging studies  CBC: Recent Labs  Lab 12/04/18 0306 12/06/18 0654 12/08/18 1116  WBC 10.2 10.8* 9.9  HGB 8.7* 8.4* 9.0*  HCT 27.1* 26.0* 28.5*  MCV 89.7 90.0 90.2  PLT 251 256 177   Basic Metabolic Panel: Recent Labs  Lab 12/04/18 0306 12/06/18 0654 12/08/18 1114  NA 135 138 137  K 3.5 3.5 3.7  CL 102 102 101  CO2 22 24 21*  GLUCOSE 193* 167* 280*  BUN 36* 32* 37*  CREATININE 6.47* 6.86* 6.06*  CALCIUM 8.5* 8.7* 8.7*  PHOS  --  3.8 4.1   GFR: Estimated Creatinine Clearance: 10.5 mL/min (A) (by C-G formula based on SCr of 6.06 mg/dL (H)). Liver Function Tests: Recent Labs  Lab 12/06/18 0654 12/08/18 1114  ALBUMIN 2.7* 2.7*   No results for input(s): LIPASE, AMYLASE in the last 168 hours. No results for  input(s): AMMONIA in the last 168 hours. Coagulation Profile: No results for input(s): INR, PROTIME in the last 168 hours. Cardiac Enzymes: No results for input(s): CKTOTAL, CKMB, CKMBINDEX, TROPONINI in the last 168 hours. BNP (last 3 results) No results for input(s): PROBNP in the last 8760 hours. HbA1C: No results for input(s): HGBA1C in the last 72 hours. CBG: Recent Labs  Lab 12/09/18 1103 12/09/18 1647 12/09/18 2058 12/10/18 0628 12/10/18 1058  GLUCAP 247* 175* 292* 192* 273*   Lipid Profile: No results for input(s): CHOL, HDL, LDLCALC, TRIG, CHOLHDL,  LDLDIRECT in the last 72 hours. Thyroid Function Tests: No results for input(s): TSH, T4TOTAL, FREET4, T3FREE, THYROIDAB in the last 72 hours. Anemia Panel: No results for input(s): VITAMINB12, FOLATE, FERRITIN, TIBC, IRON, RETICCTPCT in the last 72 hours. Sepsis Labs: No results for input(s): PROCALCITON, LATICACIDVEN in the last 168 hours.  Recent Results (from the past 240 hour(s))  MRSA PCR Screening     Status: None   Collection Time: 12/03/18  8:50 PM  Result Value Ref Range Status   MRSA by PCR NEGATIVE NEGATIVE Final    Comment:        The GeneXpert MRSA Assay (FDA approved for NASAL specimens only), is one component of a comprehensive MRSA colonization surveillance program. It is not intended to diagnose MRSA infection nor to guide or monitor treatment for MRSA infections. Performed at [facility], [place] [address] [place], [place] [address]       Radiology Studies: No results found.      Scheduled Meds: . aspirin EC  81 mg Oral Daily  . Chlorhexidine Gluconate Cloth  6 each Topical Q0600  . [START ON 12/11/2018] Chlorhexidine Gluconate Cloth  6 each Topical Q0600  . cholecalciferol  5,000 Units Oral Daily  . darbepoetin (ARANESP) injection - DIALYSIS  40 mcg Intravenous Q Tue-HD  . dorzolamide  1 drop Both Eyes BID  . fludrocortisone  0.1 mg Oral Daily  . FLUoxetine  20 mg Oral Daily  . gabapentin  100 mg Oral QHS  . heparin  5,000 Units Subcutaneous Q8H  . insulin aspart  0-5 Units Subcutaneous QHS  . insulin aspart  0-9 Units Subcutaneous TID WC  . insulin detemir  14 Units Subcutaneous Daily  . latanoprost  1 drop Both Eyes QHS  . midodrine  10 mg Oral BID WC  . multivitamin  1 tablet Oral QHS  . pantoprazole  40 mg Oral Daily  . rosuvastatin  40 mg Oral QHS  . ticagrelor  90 mg Oral BID  . vitamin B-12  1,000 mcg Oral Daily   Continuous Infusions: . sodium chloride      Assessment & Plan:    1. Orthostatic hypotension with dizziness:  Secondary to venous stasis/volume depletion.  Patient off amlodipine, Lasix and Coreg.  Midodrin started 4/9 and patient feels somewhat better but still very dizzy on standing or walking.  Support stockings changed to thigh high but still unable to walk without dizziness.  Patient tolerated dialysis 4/11: Ultrafiltrate volume adjusted by renal. Persistent orthostasis after Midodrin/support stockings/IV fluid bolus on 4/10.  Reluctant to add Florinef given problem #3 but at this point can try. Nephrology ordered fluid bolus again today. She is on Neurontin 300mg  daily at baseline for neuropathy , changed to 100mg  BID with her permission.  She will need PT evaluation/gait training with assistive devices to prevent falls. PT recommended Home PT with DME. Patient states she will talk to family but agreeable  to rehab eval (consult request placed). Requested CM/SW to follow up.   2.  CAD/acute coronary syndrome: Present on admission.  Status post left heart cath on April 6 showing high-grade distal stenosis of RCA requiring PCI.  Aspirin and Brilinta for 12 months along with high-dose statins.  Coreg discontinued in concern for problem #1  3.  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  Volume being managed with dialysis/ultrafiltration. Renal to adjust ultrafiltration per blood pressure tolerance.  Cannot tolerate ACE inhibitors or beta-blockers in concern for problem #1  4.  Hypertension: Patient currently has supine hypertension with systolic in 505W to 979Y and orthostatic hypotension with systolic blood pressure dropping to 90-110s.  As she is symptomatic with orthostasis, holding oral antihypertensives/diuretics at this time.  5.  Hyperlipidemia: Statins  6.  Diabetes mellitus: Blood glucose 1 30-230.  Currently on Lantus 14 units.  7.  End-stage renal disease : Management per nephrology.  Hemodialysis scheduled for Tuesday Thursday Saturday.  Watch for postdialysis hypotension.  She  did receive another IV fluid bolus this afternoon by nehrology.    8.  Anemia of chronic disease: Nephrology started patient on weekly darbepoetin  DVT prophylaxis: Heparin Code Status: Full code Family / Patient Communication: Discussed with patient.  Disposition Plan: Home when dizziness resolves, cleared by PT/CM.     LOS: 8 days    Time spent: [place]    Guilford Shi, MD [facility] Pager [phone]  If 7PM-7AM, please contact night-coverage [email or web address] Password [facility] 12/10/2018, 3:08 PM

## 2018-12-10 NOTE — Progress Notes (Signed)
Thank you for consult on Ms. Gotsch. Patient is currently at supervision level and limited due to ongoing orthostatic changes. Agree with recommendations for post acute rehab once symptomatic hypotension addressed.

## 2018-12-10 NOTE — Progress Notes (Signed)
Physical Therapy Treatment Patient Details Name: Priscilla Davis MRN: [id number] DOB: 02/08/1951 Today's Date: 12/10/2018    History of Present Illness Priscilla Davis is a 68 y.o. female with medical history significant of ESRD recently started on hemodialysis through left AV fistula, cardiomyopathy, abnormal nuclear stress test, EF of 40%, hypertension and diabetes , recently quit a smoker who presents to the emergency room with worsening shortness of breath and chest discomfort for 3 weeks.  Patient had recent hospitalization, abnormal nuclear scan test, was seen outpatient and scheduled for cardiac cath, completed 4/6.    PT Comments    Pt still getting symptomatic once standing.  Numbers at time of taking BP are not extremely low.  During gait, pt in not symptomatic.  Once standing still, dizziness returns.    Follow Up Recommendations  Other (comment)(post acute rehab)     Equipment Recommendations  Rolling walker with 5" wheels;3in1 (PT)    Recommendations for Other Services       Precautions / Restrictions Precautions Precautions: Fall    Mobility  Bed Mobility               General bed mobility comments: Sitting EOB on arrival  Transfers Overall transfer level: Needs assistance Equipment used: Rolling walker (2 wheeled) Transfers: Sit to/from Stand Sit to Stand: Supervision         General transfer comment: cues to make sure she uses hands approp for stability  Ambulation/Gait Ambulation/Gait assistance: Supervision Gait Distance (Feet): 130 Feet Assistive device: Rolling walker (2 wheeled) Gait Pattern/deviations: Step-through pattern Gait velocity: slower   General Gait Details: cues for posture and to lighten the weight on the RW.  Otherwise slow, but steady.  While ambulating, pt was not symptomatic of lowered BP's   Stairs             Wheelchair Mobility    Modified Rankin (Stroke Patients Only)       Balance Overall balance assessment:  Needs assistance   Sitting balance-Leahy Scale: Good       Standing balance-Leahy Scale: Fair                              Cognition Arousal/Alertness: Awake/alert Behavior During Therapy: WFL for tasks assessed/performed Overall Cognitive Status: Within Functional Limits for tasks assessed                                        Exercises      General Comments General comments (skin integrity, edema, etc.): Pt completed 4 trials of sit to stand and standing until she got dizzy before sitting.  On one trial when dizzy, BP taken at 132/59 (80) and after gait and before sitting  BP 119/55 (75,, BUT pt unable to maintain stand and sat mid taking BP      Pertinent Vitals/Pain Pain Assessment: No/denies pain    Home Living                      Prior Function            PT Goals (current goals can now be found in the care plan section) Acute Rehab PT Goals Patient Stated Goal: ultimately home independent PT Goal Formulation: With patient Time For Goal Achievement: 12/19/18 Potential to Achieve Goals: Good Progress towards PT goals: Progressing toward goals  Frequency    Min 3X/week      PT Plan Current plan remains appropriate    Co-evaluation              AM-PAC PT "6 Clicks" Mobility   Outcome Measure  Help needed turning from your back to your side while in a flat bed without using bedrails?: None Help needed moving from lying on your back to sitting on the side of a flat bed without using bedrails?: None Help needed moving to and from a bed to a chair (including a wheelchair)?: A Little Help needed standing up from a chair using your arms (e.g., wheelchair or bedside chair)?: None Help needed to walk in hospital room?: A Little Help needed climbing 3-5 steps with a railing? : A Little 6 Click Score: 21    End of Session   Activity Tolerance: Patient tolerated treatment well(limitations of BP) Patient left:  Other (comment);with call bell/phone within reach(sitting in bed) Nurse Communication: Mobility status PT Visit Diagnosis: Unsteadiness on feet (R26.81);Muscle weakness (generalized) (M62.81);Difficulty in walking, not elsewhere classified (R26.2)     Time: 4580-9983 PT Time Calculation (min) (ACUTE ONLY): 21 min  Charges:  $Gait Training: 8-22 mins                     12/10/2018  Donnella Sham, PT Acute Rehabilitation Services [phone]  (pager) [phone]  (office)   Tessie Fass Chey Cho 12/10/2018, 5:55 PM

## 2018-12-10 NOTE — Discharge Instructions (Signed)

## 2018-12-10 NOTE — Progress Notes (Signed)
[facility] ROUNDING NOTE   Subjective:   This is a very pleasant 68 year old lady that was admitted with chest pain.  She is end-stage renal disease with a recent stopped on dialysis her first dialysis treatment was 11/15/2018.  She dialyzes Tuesday Thursday Saturday.  Cardiac catheterization status post drug-eluting stent to the proximal RCA 12/03/2018 appreciate assistance from Dr. Gwenlyn Found.  She has been orthostatic and home Coreg and norvasc have been dc'd.   Today pt continues to report standing lightheadedness and orthostatics showed standing BP 80's w/ symptoms. Getting 1 L bolus IVF's now.  No other c/o's.     Aspirin 81 mg daily Midodrine 10mg  bid   Crestor 40 mg daily Prozac 20 mg daily insulin sliding scale, Levemir 14 units subcu, Protonix 40 mg daily vitamin B12 1000 mcg daily, Neurontin 300 mg daily, Rena-Vite 1 daily, cholecalciferol 5000 units daily,.  Aranesp 40 mcg 12/04/2018, Brilinta 90 mg daily twice daily   Objective:  Vital signs in last 24 hours:  Temp:  [97.7 F (36.5 C)-98 F (36.7 C)] 97.8 F (36.6 C) (04/13 1149) Pulse Rate:  [69-73] 69 (04/13 0711) Resp:  [18] 18 (04/13 1149) BP: (115-183)/(56-75) 183/68 (04/13 1149)  Weight change:  Filed Weights   12/06/18 1051 12/08/18 1049 12/08/18 1502  Weight: 89.1 kg 91.3 kg 90.4 kg    Intake/Output: I/O last 3 completed shifts: In: 980 [P.O.:977; I.V.:3] Out: 700 [Urine:700]   Intake/Output this shift:  Total I/O In: 540 [P.O.:540] Out: 300 [Urine:300]  Exam:  Alert, no distress, lyilng at 45 deg in bed, pleasant Lying comfortably in bed no distress CVS-regular rate and rhythm RS- CTA no wheezes or rales ABD- BS present soft non-distended EXT- no edema   left AV graft with bruit   HD TTS  4h   400/1.5   91kg  3K/2.25 bath   L AVG  Hep 2000 - venofer 50/wk  - mircera 50 ug every 2 wks, last 3/31   Assessment/ Plan:   Chest pain / hx of cardiomyopathy: sp cardiac catheterization 12/03/2018  appreciate assistance from Dr. Gwenlyn Found with PCI and drug-eluting stent to RCA.  Last 2D echo 09/10/2018 EF 35 to 40%.  Aortic valve sclerotic without stenosis trivial mitral regurgitation.  Patient has been started on Brilinta 90 mg twice daily  ESRD on HD TTS: HD tomorrow  Hypertension: blood pressures soft, norvasc/ coreg home meds dc'd  Orthostatic hypotension: not resolved w/ midodrine, still symptomatic. Under dry wt by 1kg, may be gaining lean body wt as she is new ESRD patient x 3 wks. Will give 1L saline bolus and repeat BP's, let wt's/ vol come up w/ keep even on HD tomorrow. Home when hypotension resolved. Cont midodrine for now.   Volume: no extra vol on exam  Anemia stable slight decrease in hemoglobin, iron saturations appear to be adequate.  She is started on darbepoetin.  40 mcg q. Tuesday  Metabolic bone disease no VDRA at present  Diabetes mellitus as per primary team.  Recent increase in Lantus  Hyperlipidemia continues on Crestor   LOS: 8 Sandy Salaam Meredeth Furber @TODAY @2 :20 PM   Basic Metabolic Panel: Recent Labs  Lab 12/04/18 0306 12/06/18 0654 12/08/18 1114  NA 135 138 137  K 3.5 3.5 3.7  CL 102 102 101  CO2 22 24 21*  GLUCOSE 193* 167* 280*  BUN 36* 32* 37*  CREATININE 6.47* 6.86* 6.06*  CALCIUM 8.5* 8.7* 8.7*  PHOS  --  3.8 4.1  Liver Function Tests: Recent Labs  Lab 12/06/18 0654 12/08/18 1114  ALBUMIN 2.7* 2.7*   No results for input(s): LIPASE, AMYLASE in the last 168 hours. No results for input(s): AMMONIA in the last 168 hours.  CBC: Recent Labs  Lab 12/04/18 0306 12/06/18 0654 12/08/18 1116  WBC 10.2 10.8* 9.9  HGB 8.7* 8.4* 9.0*  HCT 27.1* 26.0* 28.5*  MCV 89.7 90.0 90.2  PLT 251 256 323    Cardiac Enzymes: No results for input(s): CKTOTAL, CKMB, CKMBINDEX, TROPONINI in the last 168 hours.  BNP: Invalid input(s): POCBNP  CBG: Recent Labs  Lab 12/09/18 1103 12/09/18 1647 12/09/18 2058 12/10/18 0628 12/10/18 1058  GLUCAP  247* 175* 69* 192* 33*    Microbiology: Results for orders placed or performed during the hospital encounter of 12/01/18  MRSA PCR Screening     Status: None   Collection Time: 12/03/18  8:50 PM  Result Value Ref Range Status   MRSA by PCR NEGATIVE NEGATIVE Final    Comment:        The GeneXpert MRSA Assay (FDA approved for NASAL specimens only), is one component of a comprehensive MRSA colonization surveillance program. It is not intended to diagnose MRSA infection nor to guide or monitor treatment for MRSA infections. Performed at [facility], [place] [address], [place], [place] [address]     Coagulation Studies: No results for input(s): LABPROT, INR in the last 72 hours.  Urinalysis: No results for input(s): COLORURINE, LABSPEC, PHURINE, GLUCOSEU, HGBUR, BILIRUBINUR, KETONESUR, PROTEINUR, UROBILINOGEN, NITRITE, LEUKOCYTESUR in the last 72 hours.  Invalid input(s): APPERANCEUR    Imaging: No results found.   Medications:   . sodium chloride     . aspirin EC  81 mg Oral Daily  . Chlorhexidine Gluconate Cloth  6 each Topical Q0600  . cholecalciferol  5,000 Units Oral Daily  . darbepoetin (ARANESP) injection - DIALYSIS  40 mcg Intravenous Q Tue-HD  . dorzolamide  1 drop Both Eyes BID  . fludrocortisone  0.1 mg Oral Daily  . FLUoxetine  20 mg Oral Daily  . gabapentin  100 mg Oral QHS  . heparin  5,000 Units Subcutaneous Q8H  . insulin aspart  0-5 Units Subcutaneous QHS  . insulin aspart  0-9 Units Subcutaneous TID WC  . insulin detemir  14 Units Subcutaneous Daily  . latanoprost  1 drop Both Eyes QHS  . midodrine  10 mg Oral BID WC  . multivitamin  1 tablet Oral QHS  . pantoprazole  40 mg Oral Daily  . rosuvastatin  40 mg Oral QHS  . ticagrelor  90 mg Oral BID  . vitamin B-12  1,000 mcg Oral Daily   sodium chloride, acetaminophen, albuterol, alteplase, heparin, nitroGLYCERIN, ondansetron (ZOFRAN) IV

## 2018-12-10 NOTE — Progress Notes (Signed)
Cardiac Rehab Advisory Cardiac Rehab Phase I is not seeing pts face to face at this time due to Covid 19 restrictions. Ambulation is occurring through nursing, PT, and mobility teams. We will help facilitate that process as needed. We are calling pts in their rooms and discussing education. We will then deliver education materials to pts RN for delivery to pt.    Spoke with pt on phone and reviewed ed. Reinforced Brilinta/ASA, NTG, walking/building up strength, and CRPII. Voiced understanding, no further questions. CR will sign off. Continue ambulation with PT and staff. [phone] [place], ACSM 10:41 AM 12/10/2018

## 2018-12-10 NOTE — Progress Notes (Signed)
Inpatient Rehab Admissions:  Inpatient Rehab Consult received.  Please see note from Algis Liming, PA, documented 12/10/18.  Pt currently ambulating 200' with RW and supervision and too functional for CIR stay.  Recommend home with home health services versus SNF if pt does not feel she has adequate support at home.  Will sign off at this time.    Signed: Shann Medal, PT, DPT Admissions Coordinator [phone] 12/10/18  3:32 PM

## 2018-12-10 NOTE — Progress Notes (Addendum)
Orthostatic vital signs done after 2nd liter bolus completed 1844 160/66 map 94 lying  1845 134/65 map 86 sitting patient reported feeling good, no dizziness 1847 123/57 map 75 patient standing, stated feeling, "alittle woozy ", no dizziness 1848 113/57 map 73 patient stated she feels just alittle dizzy.  Patient sat down for a few minutes after orthostatics completed then went to the bathroom, to the sink to wash her hands and back to bed. No dizziness reported, just alittle woozy and stated she felt much better.

## 2018-12-10 NOTE — Progress Notes (Signed)
Patient lying in bed orthostatics after 1 liter bolus received  1427 lying     140/67     Map    87   1429 sitting   125/68     Map    83   "alittle dizzy, better than before 1431 standing    90/60      Map    70   Not dizzy when first standing, after a minute very dizzy.  Dr. Jonnie Finner notified and orders received for infusion of another liter of NS now and orthostatic vs to follow.

## 2018-12-10 NOTE — Progress Notes (Signed)
Inpatient Diabetes Program Recommendations  AACE/ADA: New Consensus Statement on Inpatient Glycemic Control (2015)  Target Ranges:  Prepandial:   less than 140 mg/dL      Peak postprandial:   less than 180 mg/dL (1-2 hours)      Critically ill patients:  140 - 180 mg/dL   Lab Results  Component Value Date   GLUCAP 192 (H) 12/10/2018    Review of Glycemic Control Results for Priscilla Davis, Priscilla Davis (MRN [id number]) as of 12/10/2018 11:45  Ref. Range 12/09/2018 11:03 12/09/2018 16:47 12/09/2018 20:58 12/10/2018 06:28  Glucose-Capillary Latest Ref Range: 70 - 99 mg/dL 247 (H) 175 (H) 292 (H) 192 (H)   Diabetes history: DM2 Outpatient Diabetes medications:  Levemir 15 units q HS, Victoza 1.8 mg daily Current orders for Inpatient glycemic control:  Levemir 14 units daily, Novolog sensitive tid with meals and HS  Inpatient Diabetes Program Recommendations:    If appropriate, consider adding Novolog meal coverage 3 units tid with meals.  Thanks,  Adah Perl, RN, BC-ADM Inpatient Diabetes Coordinator Pager [phone] (8a-5p)

## 2018-12-10 NOTE — Progress Notes (Signed)
Pt has had video conference arranged with Dr. Oval Linsey post hospital.  She has done this before and is aware of how it works.

## 2018-12-10 NOTE — Progress Notes (Signed)
Progress Note  Patient Name: Priscilla Davis Date of Encounter: 12/10/2018  Primary Cardiologist: Priscilla Latch, MD   Subjective   The patient is frustrated with her postural dizziness.  She otherwise is doing fine with no chest pain or shortness of breath.  Inpatient Medications    Scheduled Meds: . aspirin EC  81 mg Oral Daily  . Chlorhexidine Gluconate Cloth  6 each Topical Q0600  . cholecalciferol  5,000 Units Oral Daily  . darbepoetin (ARANESP) injection - DIALYSIS  40 mcg Intravenous Q Tue-HD  . dorzolamide  1 drop Both Eyes BID  . fludrocortisone  0.1 mg Oral Daily  . FLUoxetine  20 mg Oral Daily  . gabapentin  100 mg Oral QHS  . heparin  5,000 Units Subcutaneous Q8H  . insulin aspart  0-5 Units Subcutaneous QHS  . insulin aspart  0-9 Units Subcutaneous TID WC  . insulin detemir  14 Units Subcutaneous Daily  . latanoprost  1 drop Both Eyes QHS  . midodrine  10 mg Oral BID WC  . multivitamin  1 tablet Oral QHS  . pantoprazole  40 mg Oral Daily  . rosuvastatin  40 mg Oral QHS  . sodium chloride flush  3 mL Intravenous Q12H  . ticagrelor  90 mg Oral BID  . vitamin B-12  1,000 mcg Oral Daily   Continuous Infusions: . sodium chloride    . sodium chloride    . sodium chloride     PRN Meds: sodium chloride, sodium chloride, sodium chloride, acetaminophen, albuterol, alteplase, heparin, lidocaine (PF), lidocaine-prilocaine, nitroGLYCERIN, ondansetron (ZOFRAN) IV, pentafluoroprop-tetrafluoroeth, sodium chloride flush   Vital Signs    Vitals:   12/09/18 1915 12/09/18 2225 12/10/18 0620 12/10/18 0711  BP: (!) 140/58  (!) 150/70 115/75  Pulse:   73 69  Resp: 18   18  Temp: 98 F (36.7 C) 98 F (36.7 C) 97.7 F (36.5 C) 98 F (36.7 C)  TempSrc: Oral Oral Oral Oral  SpO2:      Weight:      Height:        Intake/Output Summary (Last 24 hours) at 12/10/2018 4765 Last data filed at 12/10/2018 0500 Gross per 24 hour  Intake 737 ml  Output 700 ml  Net 37 ml    Last 3 Weights 12/08/2018 12/08/2018 12/06/2018  Weight (lbs) 199 lb 4.7 oz 201 lb 4.5 oz 196 lb 6.9 oz  Weight (kg) 90.4 kg 91.3 kg 89.1 kg      Telemetry    SR - Personally Reviewed  ECG    No new - Personally Reviewed  Physical Exam  per Dr. Burt Davis  Alert, oriented woman in no distress GEN: No acute distress.   Neck: No JVD Cardiac: RRR, no murmurs, rubs, or gallops.  Respiratory: Clear to auscultation bilaterally. GI: Soft, nontender, non-distended  MS: No edema; No deformity.  The right groin site is clear with no ecchymoses or hematoma Neuro:  Nonfocal  Psych: Normal affect   Labs    Chemistry Recent Labs  Lab 12/04/18 0306 12/06/18 0654 12/08/18 1114  NA 135 138 137  K 3.5 3.5 3.7  CL 102 102 101  CO2 22 24 21*  GLUCOSE 193* 167* 280*  BUN 36* 32* 37*  CREATININE 6.47* 6.86* 6.06*  CALCIUM 8.5* 8.7* 8.7*  ALBUMIN  --  2.7* 2.7*  GFRNONAA 6* 6* 7*  GFRAA 7* 6* 8*  ANIONGAP 11 12 15      Hematology Recent Labs  Lab 12/04/18 [phone]  12/06/18 0654 12/08/18 1116  WBC 10.2 10.8* 9.9  RBC 3.02* 2.89* 3.16*  HGB 8.7* 8.4* 9.0*  HCT 27.1* 26.0* 28.5*  MCV 89.7 90.0 90.2  MCH 28.8 29.1 28.5  MCHC 32.1 32.3 31.6  RDW 14.8 14.9 15.2  PLT 251 256 323    Cardiac EnzymesNo results for input(s): TROPONINI in the last 168 hours. No results for input(s): TROPIPOC in the last 168 hours.   BNPNo results for input(s): BNP, PROBNP in the last 168 hours.   DDimer No results for input(s): DDIMER in the last 168 hours.   Radiology    No results found.  Cardiac Studies   Cardiac cath 12/03/18   Ost 2nd Mrg to 2nd Mrg lesion is 100% stenosed.  Ost 3rd Mrg lesion is 100% stenosed.  Prox RCA to Mid RCA lesion is 40% stenosed.  Dist RCA lesion is 95% stenosed.  A drug-eluting stent was successfully placed.  Post intervention, there is a 0% residual stenosis.  There is moderate left ventricular systolic dysfunction.  LV end diastolic pressure is mildly  elevated.  The left ventricular ejection fraction is 45-50% by visual estimate.   IMPRESSION: Priscilla Davis has occluded small second and third obtuse marginal branches which appear chronic.  Her LAD is widely patent and wraps the apex.  Her RCA is codominant with diffuse moderate disease as well as a high-grade distal stenosis at the "crux".  I performed PCI and drug-eluting stenting with a synergy drug-eluting stent with a final diameter of 2.36 mm.  Patient tolerated procedure well.  Angiomax will continue full dose for 4 hours and then will be discontinued.  The sheath will be removed and pressure held.  Patient will remain recumbent for 6 hours after that.  She will obtain hemodialysis tomorrow morning and potentially discharged home after that with medical therapy of her residual CAD.  She left the lab in stable condition.  Patient Profile     68 y.o. female with a hx of diabetes mellitus, hypertension, end-stage renal disease recently initiated on dialysiswho is being seen for the evaluation of dyspnea and chest tightness. Patient was admitted January 2020 with chest tightness and CHF. Echocardiogram showed ejection fraction 35 to 40%, global hypokinesis, grade 1 diastolic dysfunction and moderate left atrial enlargement. Troponin was elevated but felt to be demand ischemia. Catheterization was avoided due to risk of contrast nephropathy. Nuclear study January 2020 showed ejection fraction 44%. There was a basilar inferior, inferolateral, anterolateral nonreversible defect suggestive of infarct but no ischemia. Patient recently started on hemodialysis 3 weeks ago. Priscilla Davis had planned cardiac catheterization electively to evaluate cardiomyopathy once coronavirus pandemic improved. However, patient presented to the ED 12/01/18 with dyspnea, weakness and chest tightness. Cardiac cath 4/6 showed obstructive CAD as outlined above and pt underwent PCI to RCA.    Assessment & Plan    CAD, with  cath this admit and DES to RCA, occluded small second and third OM branch appears to be chronic.  --EF 45-50% --ASA and  Brilinta for 12 months --unable to tolerate BB or ACE/ARB due to hypotension.  ICM with EF 45-50%  --hypotension has limited medications  ESRD on HD per nephrology   HTN - now on midodrine 10 mg BID and BP labile 150/70 to 115/75   HLD on crestor 40 mg recheck lipids in 8 weeks.   Dizziness most likely orthostasis, holding antihypertensives   Chronic anemia with HGB at 9 per nephrology  DM per IM   [facility] will sign off.   Medication Recommendations:  Continue ASA/brilinta x 12 months without interruption Other recommendations (labs, testing, etc):  none Follow up as an outpatient:  Will arrange telemedicine visit with Dr Oval Davis  For questions or updates, please contact [place] Please consult [email or web address] for contact info under   Patient seen, examined. Available data reviewed. Agree with findings, assessment, and plan as outlined by Cecilie Kicks, NP-C.  The patient is independently interviewed and examined.  The physical exam findings documented above reflect my personal exam of this patient.  The patient has undergone uncomplicated PCI of the distal right coronary artery, treated with a drug-eluting stent.  She should remain on dual antiplatelet therapy with aspirin and ticagrelor for 12 months.  She should remain on a high intensity statin drug indefinitely (currently treated with rosuvastatin 40 mg daily).  Otherwise she cannot tolerate a beta-blocker or ACE inhibitor/ARB because of symptomatic hypotension.  She is receiving 1 L of saline today in order to improve her orthostasis.  I suspect her dialysis will have to be changed with less fluid removal.  We will sign off today.  Will arrange outpatient telemedicine follow-up with Priscilla Davis.  Sherren Mocha, M.D. 12/10/2018 10:55 AM         Signed, Cecilie Kicks, NP  12/10/2018, 9:07 AM

## 2018-12-11 LAB — CBC
HCT: 27.9 % — ABNORMAL LOW (ref 36.0–46.0)
Hemoglobin: 8.9 g/dL — ABNORMAL LOW (ref 12.0–15.0)
MCH: 28.9 pg (ref 26.0–34.0)
MCHC: 31.9 g/dL (ref 30.0–36.0)
MCV: 90.6 fL (ref 80.0–100.0)
Platelets: 342 10*3/uL (ref 150–400)
RBC: 3.08 MIL/uL — ABNORMAL LOW (ref 3.87–5.11)
RDW: 15 % (ref 11.5–15.5)
WBC: 9.3 10*3/uL (ref 4.0–10.5)
nRBC: 0 % (ref 0.0–0.2)

## 2018-12-11 LAB — BASIC METABOLIC PANEL
Anion gap: 15 (ref 5–15)
BUN: 29 mg/dL — ABNORMAL HIGH (ref 8–23)
CO2: 21 mmol/L — ABNORMAL LOW (ref 22–32)
Calcium: 8.8 mg/dL — ABNORMAL LOW (ref 8.9–10.3)
Chloride: 106 mmol/L (ref 98–111)
Creatinine, Ser: 5.19 mg/dL — ABNORMAL HIGH (ref 0.44–1.00)
GFR calc Af Amer: 9 mL/min — ABNORMAL LOW (ref >60)
GFR calc non Af Amer: 8 mL/min — ABNORMAL LOW (ref >60)
Glucose, Bld: 125 mg/dL — ABNORMAL HIGH (ref 70–99)
Potassium: 3.5 mmol/L (ref 3.5–5.1)
Sodium: 142 mmol/L (ref 135–145)

## 2018-12-11 LAB — GLUCOSE, CAPILLARY
Glucose-Capillary: 113 mg/dL — ABNORMAL HIGH (ref 70–99)
Glucose-Capillary: 164 mg/dL — ABNORMAL HIGH (ref 70–99)
Glucose-Capillary: 183 mg/dL — ABNORMAL HIGH (ref 70–99)
Glucose-Capillary: 259 mg/dL — ABNORMAL HIGH (ref 70–99)

## 2018-12-11 MED ORDER — DARBEPOETIN ALFA 40 MCG/0.4ML IJ SOSY
PREFILLED_SYRINGE | INTRAMUSCULAR | Status: AC
  Administered 2018-12-11: 40 ug via INTRAVENOUS
  Filled 2018-12-11: qty 0.4

## 2018-12-11 MED ORDER — HEPARIN SODIUM (PORCINE) 1000 UNIT/ML DIALYSIS: 2000.0000 [IU] | Freq: Once | INTRAMUSCULAR | Status: DC

## 2018-12-11 MED ORDER — MIDODRINE HCL 5 MG PO TABS
5.0000 mg | ORAL_TABLET | Freq: Two times a day (BID) | ORAL | Status: DC
  Administered 2018-12-11 – 2018-12-12 (×2): 5 mg via ORAL
  Filled 2018-12-11 (×2): qty 1

## 2018-12-11 NOTE — Progress Notes (Signed)
PROGRESS NOTE  Priscilla Davis  ENI:[id number]  DOB: 08/07/1951  DOA: 12/01/2018 PCP: Kristen Loader, FNP  Brief Narrative: 68 year old female with end-stage renal disease on hemodialysis TTS (initiated on November 15, 2018), who was admitted in January for chest pain/acute CHF with echo showing EF 35 to 40% but did not undergo cardiac cath at that time and concern for renal failure/corona pandemic, presented now with chest pain and admitted for cardiology evaluation on April 4.  Patient seen by cardiology and underwent cardiac cath on April 6 showing obstructive CAD and required PCI to RCA.  Nephrology has been following along and she underwent dialysis per schedule.  Hospital course complicated by postdialysis hypotension, orthostasis, dizziness and resultant ataxia.  Subjective: - patient seen on dialysis this morning, still reports feeling weak and dizzy  -A little better from the last 2 days , denies any chest pain or dyspnea  Assessment & Plan:   1. Orthostatic hypotension with dizziness: -secondary to ESRD, hypovolemia -taken off amlodipine, Lasix and Coreg -Midodrin started 4/9, slightly better, Bp drop still significant but better -PT to reassess tomorrow  -plan Home PT at discharge -educated on lifestyle changes, caution with position changes, using her walker etc  2.  CAD/acute coronary syndrome:  -present on admission -Status post left heart cath on April 6 showing high-grade distal stenosis of RCA requiring PCI.  Aspirin and Brilinta for 12 months along with high-dose statins.  Coreg discontinued in concern for problem #1  3.  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   -Volume being managed with dialysis/ultrafiltration.  -Nephrology adjusted ultrafiltration due to orthostasis -Cannot tolerate ACE inhibitors or beta-blockers in concern for problem #1  4.  Hypertension: Patient currently has supine hypertension with systolic in 614E to 315Q and orthostatic  hypotension with systolic blood pressure dropping to 90-110s.  As she is symptomatic with orthostasis, holding oral antihypertensives/diuretics at this time. -See above discussion  5.  Hyperlipidemia: Statins  6.  Diabetes mellitus: Blood glucose 1 30-230.  Currently on Lantus 14 units.  7.  End-stage renal disease : Management per nephrology.  Hemodialysis scheduled for Tuesday Thursday Saturday.    8.  Anemia of chronic disease: Nephrology started patient on weekly darbepoetin  DVT prophylaxis: Heparin Code Status: Full code Family / Patient Communication: Discussed with patient.  Disposition Plan: Home tomorrow if dizziness symptomatically better     LOS: 9 days    Time spent: 25 MIN      Objective: Vitals:   12/11/18 1100 12/11/18 1106 12/11/18 1152 12/11/18 1213  BP: (!) 154/72 (!) 188/83 (!) 173/67   Pulse: 68 68  76  Resp:  18  20  Temp:  98.6 F (37 C) 98.2 F (36.8 C) 98.2 F (36.8 C)  TempSrc:  Oral Oral Oral  SpO2:  100%  99%  Weight:      Height:        Intake/Output Summary (Last 24 hours) at 12/11/2018 1454 Last data filed at 12/11/2018 1106 Gross per 24 hour  Intake 1000 ml  Output -403 ml  Net 1403 ml   Filed Weights   12/08/18 1049 12/08/18 1502 12/11/18 0701  Weight: 91.3 kg 90.4 kg 92 kg    Physical Examination:  Gen: Awake, Alert, Oriented X 3, chronically ill-appearing, no distress HEENT: PERRLA, Neck supple, no JVD Lungs: CTAB CVS: RRR,No Gallops,Rubs or new Murmurs Abd: soft, Non tender, non distended, BS present Extremities: Trace edema Skin: no new rashes Psychiatry:  Judgement and insight appear normal. Mood & affect appropriate.     Data Reviewed: I have personally reviewed following labs and imaging studies  CBC: Recent Labs  Lab 12/06/18 0654 12/08/18 1116 12/11/18 0816  WBC 10.8* 9.9 9.3  HGB 8.4* 9.0* 8.9*  HCT 26.0* 28.5* 27.9*  MCV 90.0 90.2 90.6  PLT 256 323 376   Basic Metabolic Panel: Recent Labs  Lab  12/06/18 0654 12/08/18 1114 12/11/18 0741  NA 138 137 142  K 3.5 3.7 3.5  CL 102 101 106  CO2 24 21* 21*  GLUCOSE 167* 280* 125*  BUN 32* 37* 29*  CREATININE 6.86* 6.06* 5.19*  CALCIUM 8.7* 8.7* 8.8*  PHOS 3.8 4.1  --    GFR: Estimated Creatinine Clearance: 12.4 mL/min (A) (by C-G formula based on SCr of 5.19 mg/dL (H)). Liver Function Tests: Recent Labs  Lab 12/06/18 0654 12/08/18 1114  ALBUMIN 2.7* 2.7*   No results for input(s): LIPASE, AMYLASE in the last 168 hours. No results for input(s): AMMONIA in the last 168 hours. Coagulation Profile: No results for input(s): INR, PROTIME in the last 168 hours. Cardiac Enzymes: No results for input(s): CKTOTAL, CKMB, CKMBINDEX, TROPONINI in the last 168 hours. BNP (last 3 results) No results for input(s): PROBNP in the last 8760 hours. HbA1C: No results for input(s): HGBA1C in the last 72 hours. CBG: Recent Labs  Lab 12/10/18 1058 12/10/18 1647 12/10/18 2100 12/11/18 0639 12/11/18 1211  GLUCAP 273* 160* 192* 113* 164*   Lipid Profile: No results for input(s): CHOL, HDL, LDLCALC, TRIG, CHOLHDL, LDLDIRECT in the last 72 hours. Thyroid Function Tests: No results for input(s): TSH, T4TOTAL, FREET4, T3FREE, THYROIDAB in the last 72 hours. Anemia Panel: No results for input(s): VITAMINB12, FOLATE, FERRITIN, TIBC, IRON, RETICCTPCT in the last 72 hours. Sepsis Labs: No results for input(s): PROCALCITON, LATICACIDVEN in the last 168 hours.  Recent Results (from the past 240 hour(s))  MRSA PCR Screening     Status: None   Collection Time: 12/03/18  8:50 PM  Result Value Ref Range Status   MRSA by PCR NEGATIVE NEGATIVE Final    Comment:        The GeneXpert MRSA Assay (FDA approved for NASAL specimens only), is one component of a comprehensive MRSA colonization surveillance program. It is not intended to diagnose MRSA infection nor to guide or monitor treatment for MRSA infections. Performed at [facility], [place] [address], [place], [place] [address]       Radiology Studies: No results found.      Scheduled Meds:  aspirin EC  81 mg Oral Daily   Chlorhexidine Gluconate Cloth  6 each Topical Q0600   Chlorhexidine Gluconate Cloth  6 each Topical Q0600   cholecalciferol  5,000 Units Oral Daily   darbepoetin (ARANESP) injection - DIALYSIS  40 mcg Intravenous Q Tue-HD   dorzolamide  1 drop Both Eyes BID   fludrocortisone  0.1 mg Oral Daily   FLUoxetine  20 mg Oral Daily   gabapentin  100 mg Oral QHS   heparin  5,000 Units Subcutaneous Q8H   insulin aspart  0-5 Units Subcutaneous QHS   insulin aspart  0-9 Units Subcutaneous TID WC   insulin detemir  14 Units Subcutaneous Daily   latanoprost  1 drop Both Eyes QHS   midodrine  5 mg Oral BID WC   multivitamin  1 tablet Oral QHS   pantoprazole  40 mg Oral Daily   rosuvastatin  40 mg  Oral QHS   ticagrelor  90 mg Oral BID   vitamin B-12  1,000 mcg Oral Daily   Continuous Infusions:  sodium chloride      Assessment & Plan:    1. Orthostatic hypotension with dizziness: Secondary to venous stasis/volume depletion.  Patient off amlodipine, Lasix and Coreg.  Midodrin started 4/9 and patient feels somewhat better but still very dizzy on standing or walking.  Support stockings changed to thigh high but still unable to walk without dizziness.  Patient tolerated dialysis 4/11: Ultrafiltrate volume adjusted by renal. Persistent orthostasis after Midodrin/support stockings/IV fluid bolus on 4/10.  Reluctant to add Florinef given problem #3 but at this point can try. Nephrology ordered fluid bolus again today. She is on Neurontin 300mg  daily at baseline for neuropathy , changed to 100mg  BID with her permission.  She will need PT evaluation/gait training with assistive devices to prevent falls. PT recommended Home PT with DME. Patient states she will talk to family but agreeable to rehab eval (consult request placed). Requested CM/SW  to follow up.   2.  CAD/acute coronary syndrome: Present on admission.  Status post left heart cath on April 6 showing high-grade distal stenosis of RCA requiring PCI.  Aspirin and Brilinta for 12 months along with high-dose statins.  Coreg discontinued in concern for problem #1  3.  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  Volume being managed with dialysis/ultrafiltration. Renal to adjust ultrafiltration per blood pressure tolerance.  Cannot tolerate ACE inhibitors or beta-blockers in concern for problem #1  4.  Hypertension: Patient currently has supine hypertension with systolic in 657Q to 469G and orthostatic hypotension with systolic blood pressure dropping to 90-110s.  As she is symptomatic with orthostasis, holding oral antihypertensives/diuretics at this time.  5.  Hyperlipidemia: Statins  6.  Diabetes mellitus: Blood glucose 1 30-230.  Currently on Lantus 14 units.  7.  End-stage renal disease : Management per nephrology.  Hemodialysis scheduled for Tuesday Thursday Saturday.  Watch for postdialysis hypotension.  She did receive another IV fluid bolus this afternoon by nehrology.    8.  Anemia of chronic disease: Nephrology started patient on weekly darbepoetin  DVT prophylaxis: Heparin Code Status: Full code Family / Patient Communication: Discussed with patient.  Disposition Plan: Home when dizziness resolves, cleared by PT/CM.     LOS: 9 days    Time spent: [place], MD [facility]  12/11/2018, 2:54 PM

## 2018-12-11 NOTE — Care Management Important Message (Signed)
Important Message  Patient Details  Name: Priscilla Davis MRN: [id number] Date of Birth: 1951-02-13   Medicare Important Message Given:  Yes    Retta Pitcher Montine Circle 12/11/2018, 4:07 PM

## 2018-12-11 NOTE — Progress Notes (Signed)
[facility] ROUNDING NOTE   Subjective:   This is a very pleasant 68 year old lady that was admitted with chest pain.  She is end-stage renal disease with a recent stopped on dialysis her first dialysis treatment was 11/15/2018.  She dialyzes Tuesday Thursday Saturday.  Cardiac catheterization status post drug-eluting stent to the proximal RCA 12/03/2018 appreciate assistance from Dr. Gwenlyn Found.  She has been orthostatic and home Coreg and norvasc have been dc'd.   Standing BP's last night around dinner time improved, still sig drops from 160/66 lying to 134/65 sitting and 113/57 standing. Symptoms improving, minimal lightheadedness yest. On HD this am.  No c/o.    Aspirin 81 mg daily Midodrine 10mg  bid   Crestor 40 mg daily Prozac 20 mg daily insulin sliding scale, Levemir 14 units subcu, Protonix 40 mg daily vitamin B12 1000 mcg daily, Neurontin 300 mg daily, Rena-Vite 1 daily, cholecalciferol 5000 units daily,.  Aranesp 40 mcg 12/04/2018, Brilinta 90 mg daily twice daily   Objective:  Vital signs in last 24 hours:  Temp:  [97.7 F (36.5 C)-98.6 F (37 C)] 98.2 F (36.8 C) (04/14 1213) Pulse Rate:  [54-76] 76 (04/14 1213) Resp:  [16-20] 20 (04/14 1213) BP: (113-194)/(55-98) 173/67 (04/14 1152) SpO2:  [95 %-100 %] 99 % (04/14 1213) Weight:  [92 kg] 92 kg (04/14 0701)  Weight change:  Filed Weights   12/08/18 1049 12/08/18 1502 12/11/18 0701  Weight: 91.3 kg 90.4 kg 92 kg    Intake/Output: I/O last 3 completed shifts: In: 2532.7 [P.O.:540; IV Piggyback:1992.7] Out: 1250 [Urine:1250]   Intake/Output this shift:  Total I/O In: -  Out: -653   Exam:  Alert, no distress, lyilng at 45 deg in bed, pleasant Lying comfortably in bed no distress CVS-regular rate and rhythm RS- CTA no wheezes or rales ABD- BS present soft non-distended EXT- no edema   left AV graft with bruit   HD TTS  4h   400/1.5   91kg  3K/2.25 bath   L AVG  Hep 2000 - venofer 50/wk  - mircera 50 ug  every 2 wks, last 3/31   Assessment/ Plan:   Chest pain / hx of cardiomyopathy: sp cardiac catheterization 12/03/2018 appreciate assistance from Dr. Gwenlyn Found with PCI and drug-eluting stent to RCA.  Last 2D echo 09/10/2018 EF 35 to 40%.  Aortic valve sclerotic without stenosis trivial mitral regurgitation.  Patient has been started on Brilinta 90 mg twice daily  ESRD on HD TTS: HD today, no fluid off, let wt's come up. Raise EDW at dc.   Orthostatic hypotension: combination of vol depletion/ ^lean body wt gain + autonomic neuropathy.  Didn't improve w/ midodrine alone, improving now w/ volume loading. Will give another 500cc on HD this am and recheck standing VS after HD.  Will reduce midodrine to 5mg  bid today. Can go home when able to ambulate well and standing SBP's > 90-100.   Volume: no extra vol on exam w/ volume loading, has gained body wt since starting on HD 1 mo ago  Anemia stable slight decrease in hemoglobin, iron saturations appear to be adequate.  She is started on darbepoetin.  40 mcg q. Tuesday  Metabolic bone disease no VDRA at present  Diabetes mellitus as per primary team.  Recent increase in Lantus  Hyperlipidemia continues on Crestor   LOS: 8 Sandy Salaam Alin Chavira @TODAY @2 :20 PM   Basic Metabolic Panel: Recent Labs  Lab 12/06/18 0654 12/08/18 1114 12/11/18 0741  NA 138 137 142  K 3.5 3.7 3.5  CL 102 101 106  CO2 24 21* 21*  GLUCOSE 167* 280* 125*  BUN 32* 37* 29*  CREATININE 6.86* 6.06* 5.19*  CALCIUM 8.7* 8.7* 8.8*  PHOS 3.8 4.1  --     Liver Function Tests: Recent Labs  Lab 12/06/18 0654 12/08/18 1114  ALBUMIN 2.7* 2.7*   No results for input(s): LIPASE, AMYLASE in the last 168 hours. No results for input(s): AMMONIA in the last 168 hours.  CBC: Recent Labs  Lab 12/06/18 0654 12/08/18 1116 12/11/18 0816  WBC 10.8* 9.9 9.3  HGB 8.4* 9.0* 8.9*  HCT 26.0* 28.5* 27.9*  MCV 90.0 90.2 90.6  PLT 256 323 342    Cardiac Enzymes: No results for  input(s): CKTOTAL, CKMB, CKMBINDEX, TROPONINI in the last 168 hours.  BNP: Invalid input(s): POCBNP  CBG: Recent Labs  Lab 12/10/18 1058 12/10/18 1647 12/10/18 2100 12/11/18 0639 12/11/18 1211  GLUCAP 273* 160* 192* 113* 164*    Microbiology: Results for orders placed or performed during the hospital encounter of 12/01/18  MRSA PCR Screening     Status: None   Collection Time: 12/03/18  8:50 PM  Result Value Ref Range Status   MRSA by PCR NEGATIVE NEGATIVE Final    Comment:        The GeneXpert MRSA Assay (FDA approved for NASAL specimens only), is one component of a comprehensive MRSA colonization surveillance program. It is not intended to diagnose MRSA infection nor to guide or monitor treatment for MRSA infections. Performed at [facility], [place] [address], [place], [place] [address]     Coagulation Studies: No results for input(s): LABPROT, INR in the last 72 hours.  Urinalysis: No results for input(s): COLORURINE, LABSPEC, PHURINE, GLUCOSEU, HGBUR, BILIRUBINUR, KETONESUR, PROTEINUR, UROBILINOGEN, NITRITE, LEUKOCYTESUR in the last 72 hours.  Invalid input(s): APPERANCEUR    Imaging: No results found.   Medications:   . sodium chloride     . aspirin EC  81 mg Oral Daily  . Chlorhexidine Gluconate Cloth  6 each Topical Q0600  . Chlorhexidine Gluconate Cloth  6 each Topical Q0600  . cholecalciferol  5,000 Units Oral Daily  . darbepoetin (ARANESP) injection - DIALYSIS  40 mcg Intravenous Q Tue-HD  . dorzolamide  1 drop Both Eyes BID  . fludrocortisone  0.1 mg Oral Daily  . FLUoxetine  20 mg Oral Daily  . gabapentin  100 mg Oral QHS  . heparin  5,000 Units Subcutaneous Q8H  . insulin aspart  0-5 Units Subcutaneous QHS  . insulin aspart  0-9 Units Subcutaneous TID WC  . insulin detemir  14 Units Subcutaneous Daily  . latanoprost  1 drop Both Eyes QHS  . midodrine  10 mg Oral BID WC  . multivitamin  1 tablet Oral QHS  . pantoprazole  40 mg Oral  Daily  . rosuvastatin  40 mg Oral QHS  . ticagrelor  90 mg Oral BID  . vitamin B-12  1,000 mcg Oral Daily   sodium chloride, acetaminophen, albuterol, nitroGLYCERIN, ondansetron (ZOFRAN) IV

## 2018-12-12 LAB — GLUCOSE, CAPILLARY
Glucose-Capillary: 146 mg/dL — ABNORMAL HIGH (ref 70–99)
Glucose-Capillary: 163 mg/dL — ABNORMAL HIGH (ref 70–99)

## 2018-12-12 MED ORDER — TICAGRELOR 90 MG PO TABS: 90.0000 mg | ORAL_TABLET | Freq: Two times a day (BID) | ORAL | 3 refills | Status: AC

## 2018-12-12 MED ORDER — MIDODRINE HCL 5 MG PO TABS: 5.0000 mg | ORAL_TABLET | Freq: Two times a day (BID) | ORAL | 0 refills | Status: DC

## 2018-12-12 MED FILL — MIDODRINE HCL 5 MG TABLET: 5 | 30 days supply | Qty: 60 | Fill #0

## 2018-12-12 MED FILL — BRILINTA 90 MG TABLET: 90 | 30 days supply | Qty: 60 | Fill #0 | Status: TO

## 2018-12-12 NOTE — Discharge Summary (Addendum)
Physician Discharge Summary  Priscilla Davis GUY:[id number] DOB: 1951-05-05 DOA: 12/01/2018  PCP: Kristen Loader, FNP  Admit date: 12/01/2018 Discharge date: 12/12/2018  Time spent: 35 minutes  Recommendations for Outpatient Follow-up:  1. Cardiology Skeet Latch 5/13 2. PCP Priscilla Davis brake FNP in 7 to 10 days 3. Severe orthostatic hypotension, caution with adding BP meds, started on midodrine 4. New RCA stent, needs dual antiplatelet agents for 12 months   Discharge Diagnoses:  Principal Problem:   NSTEMI (non-ST elevated myocardial infarction) ([place])   CAD   Mild intermittent asthma   Type II diabetes mellitus with HbA1C goal to be determined [facility])   Essential hypertension   Cardiomyopathy (Priscilla Davis)   ESRD (end stage renal disease) ([facility])   Chest pain   Hyperlipidemia with target LDL less than 70   Orthostatic hypotension   Status post coronary artery stent placement   Discharge Condition: Stable  Diet recommendation: Diabetic, heart healthy  Filed Weights   12/08/18 1502 12/11/18 0701 12/12/18 0649  Weight: 90.4 kg 92 kg 90.7 kg    History of present illness:  68 year old female with end-stage renal disease on hemodialysis TTS (initiated on November 15, 2018), who was admitted in January for chest pain/acute CHF with echo showing EF 35 to 40% but did not undergo cardiac cath at that time and concern for renal failure/corona pandemic, presented now with chest pain and admitted for cardiology evaluation on April 4  Hospital Course:   1. 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 -educated on lifestyle changes, caution with position changes, using her walker etc  2.  NSTEMI/CAD/acute coronary syndrome:  -Patient was admitted in January with chest tightness and pulmonary edema at that time she had global hypokinesis and echo with EF of 35% catheterization was avoided due to risk of contrast nephropathy subsequently she started hemodialysis 3 weeks ago and readmitted with dyspnea chest tightness and pulmonary edema again -Cardiology reconsulted, status post left heart cath on April 6 showing high-grade distal stenosis of RCA requiring PCI.  Aspirin and Brilinta for 12 months along with high-dose statins.  Coreg discontinued in concern for problem #1  3.  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   -Volume being managed with dialysis/ultrafiltration.  -Nephrology adjusted ultrafiltration due to orthostasis -Cannot tolerate ACE inhibitors or beta-blockers in concern for problem #1  4.  Hypertension: Patient currently has supine hypertension with systolic in 563O to 756E and orthostatic hypotension with systolic blood pressure dropping to 90-110s.  As she is symptomatic with orthostasis, holding oral antihypertensives/diuretics at this time. -See above discussion  5.  Hyperlipidemia: Statins  6.  Diabetes mellitus: Blood glucose 1 30-230.  Currently on Lantus 14 units.  7.  End-stage renal disease : Management per nephrology.  Hemodialysis scheduled for Tuesday Thursday Saturday.    8.  Anemia of chronic disease: Nephrology started patient on weekly darbepoetin  Procedures: Left heart catheterization:   Ost 2nd Mrg to 2nd Mrg lesion is 100% stenosed.  Ost 3rd Mrg lesion is 100% stenosed.  Prox RCA to Mid RCA lesion is 40% stenosed.  Dist RCA lesion is 95% stenosed.  A drug-eluting  stent was successfully placed.  Post intervention, there is a 0% residual stenosis.  There is moderate left ventricular systolic  dysfunction.  LV end diastolic pressure is mildly elevated.  The left ventricular ejection fraction is 45-50% by visual estimate.     Consultations:  Cardiology  Renal  Discharge Exam: Vitals:   12/12/18 0522 12/12/18 0804  BP: (!) 144/100 (!) 152/67  Pulse:  72  Resp:  18  Temp: 97.6 F (36.4 C) 98.8 F (37.1 C)  SpO2: 100% 100%    General: Alert awake oriented x3 Cardiovascular: S1-S2/regular rate rhythm Respiratory: Clear bilaterally  Discharge Instructions   Discharge Instructions    Amb Referral to Cardiac Rehabilitation   Complete by:  As directed    Diagnosis:  Coronary Stents   Discharge instructions   Complete by:  As directed    Renal diabetic   Increase activity slowly   Complete by:  As directed      Allergies as of 12/12/2018   No Known Allergies     Medication List    STOP taking these medications   amLODipine 10 MG tablet Commonly known as:  NORVASC   carvedilol 12.5 MG tablet Commonly known as:  COREG   furosemide 80 MG tablet Commonly known as:  LASIX     TAKE these medications   acetaminophen 500 MG tablet Commonly known as:  TYLENOL Take 1,000 mg by mouth every 6 (six) hours as needed for fever or pain.   albuterol 108 (90 Base) MCG/ACT inhaler Commonly known as:  PROVENTIL HFA;VENTOLIN HFA Inhale 2 puffs into the lungs every 6 (six) hours as needed for wheezing or shortness of breath.   aspirin EC 81 MG tablet Take 81 mg by mouth daily.   dorzolamide 2 % ophthalmic solution Commonly known as:  TRUSOPT Place 1 drop into both eyes 2 (two) times daily.   FLUoxetine 20 MG capsule Commonly known as:  PROZAC Take 20 mg by mouth daily.   gabapentin 100 MG capsule Commonly known as:  NEURONTIN Take 1 capsule (100 mg total) by mouth daily. What changed:    medication strength  how much to take   insulin detemir 100 UNIT/ML injection Commonly known as:  LEVEMIR Inject 0.15 mLs (15 Units total) into the skin  daily. What changed:  when to take this   latanoprost 0.005 % ophthalmic solution Commonly known as:  XALATAN Place 1 drop into both eyes at bedtime.   lidocaine-prilocaine cream Commonly known as:  EMLA Apply 1 application topically Every Tuesday,Thursday,and Saturday with dialysis.   midodrine 5 MG tablet Commonly known as:  PROAMATINE Take 1 tablet (5 mg total) by mouth 2 (two) times daily with a meal for 30 days.   multivitamin with minerals Tabs tablet Take 1 tablet by mouth daily. woman's   omeprazole 20 MG capsule Commonly known as:  PRILOSEC Take 40 mg by mouth daily.   rosuvastatin 40 MG tablet Commonly known as:  CRESTOR Take 40 mg by mouth at bedtime.   ticagrelor 90 MG Tabs tablet Commonly known as:  BRILINTA Take 1 tablet (90 mg total) by mouth 2 (two) times daily.   Victoza 18 MG/3ML Sopn Generic drug:  liraglutide Inject 1.8 mg into the skin every evening.   vitamin B-12 1000 MCG tablet Commonly known as:  CYANOCOBALAMIN Take 1,000 mcg by mouth daily.   Vitamin D 125 MCG (5000 UT) Caps Take 5,000 Units by mouth daily.      No Known Allergies Follow-up  Information    Skeet Latch, MD Follow up on 01/09/2019.   Specialty:  Cardiology Why:  at 9:00 AM  by video conference like you did before. Contact information: [address] [place] [address] [phone]        Kristen Loader, FNP. Schedule an appointment as soon as possible for a visit in 1 week(s).   Specialty:  Family Medicine Contact information: [place] [address] [phone]            The results of significant diagnostics from this hospitalization (including imaging, microbiology, ancillary and laboratory) are listed below for reference.    Significant Diagnostic Studies: Dg Chest 2 View  Result Date: 12/01/2018 CLINICAL DATA:  Shortness of breath and weakness for 4 days. Recent pneumonia. EXAM: CHEST - 2 VIEW COMPARISON:   09/09/2018 FINDINGS: The heart size and mediastinal contours are within normal limits. There has been resolution of bilateral pulmonary airspace disease since previous study. Both lungs are clear. No evidence of pleural effusion. Mid and lower thoracic degenerative disc disease noted. IMPRESSION: No active cardiopulmonary disease. Electronically Signed   By: Earle Gell M.D.   On: 12/01/2018 10:59    Microbiology: Recent Results (from the past 240 hour(s))  MRSA PCR Screening     Status: None   Collection Time: 12/03/18  8:50 PM  Result Value Ref Range Status   MRSA by PCR NEGATIVE NEGATIVE Final    Comment:        The GeneXpert MRSA Assay (FDA approved for NASAL specimens only), is one component of a comprehensive MRSA colonization surveillance program. It is not intended to diagnose MRSA infection nor to guide or monitor treatment for MRSA infections. Performed at [facility], [place] [address], [place], [place] [address]      Labs: Basic Metabolic Panel: Recent Labs  Lab 12/06/18 0654 12/08/18 1114 12/11/18 0741  NA 138 137 142  K 3.5 3.7 3.5  CL 102 101 106  CO2 24 21* 21*  GLUCOSE 167* 280* 125*  BUN 32* 37* 29*  CREATININE 6.86* 6.06* 5.19*  CALCIUM 8.7* 8.7* 8.8*  PHOS 3.8 4.1  --    Liver Function Tests: Recent Labs  Lab 12/06/18 0654 12/08/18 1114  ALBUMIN 2.7* 2.7*   No results for input(s): LIPASE, AMYLASE in the last 168 hours. No results for input(s): AMMONIA in the last 168 hours. CBC: Recent Labs  Lab 12/06/18 0654 12/08/18 1116 12/11/18 0816  WBC 10.8* 9.9 9.3  HGB 8.4* 9.0* 8.9*  HCT 26.0* 28.5* 27.9*  MCV 90.0 90.2 90.6  PLT 256 323 342   Cardiac Enzymes: No results for input(s): CKTOTAL, CKMB, CKMBINDEX, TROPONINI in the last 168 hours. BNP: BNP (last 3 results) Recent Labs    12/01/18 1030  BNP 37.0    ProBNP (last 3 results) No results for input(s): PROBNP in the last 8760 hours.  CBG: Recent Labs  Lab 12/11/18 1211  12/11/18 1611 12/11/18 2104 12/12/18 0628 12/12/18 1144  GLUCAP 164* 183* 259* 146* 163*       Signed:  Domenic Polite MD.  [facility] 12/12/2018, 4:31 PM

## 2018-12-12 NOTE — Progress Notes (Signed)
Pt left unit in wheelchair accompanied by Rn.

## 2018-12-12 NOTE — TOC Transition Note (Addendum)
Transition of Care [facility]) - CM/SW Discharge Note   Patient Details  Name: Priscilla Davis MRN: [id number] Date of Birth: [birth year]  [facility] CM/SW Contact:  Maryclare Labrador, RN Phone Number: 12/12/2018, 9:20 AM   Clinical Narrative:  Pt to discharge home today - pt has been recommended for outpt therapy/pt in agreement - attending to place referral in [place]. Pt informed CM that she has arranged for family to stay with her post discharge (niece).  Pt informed CM that she has received education regarding safety measures when ambulating and standing in the home. Pt informed CM that she feels safe to discharge home with current education and family support - pt also informed CM that she would not consider SNF.   Pt declined DME as recommended and CM suggestion of [facility] including HHRN and therapy  - CM reiterated the recommendations were based upon safety concerns - pt continued to decline and informed CM that her living space is small enough that she can manage without assistive devices.  .  Pt confirms she has PCP and denied barriers with paying for medications.  Pt has Brilinta packet with cards - pt also informed of ongoing copay for medications.  [facility] confirms that prescription is ready for pickup.     Final next level of care: Home/Self Care Barriers to Discharge: Barriers Resolved   Patient Goals and CMS Choice        Discharge Placement                       Discharge Plan and Services                          Social Determinants of Health (SDOH) Interventions     Readmission Risk Interventions No flowsheet data found.

## 2018-12-12 NOTE — Progress Notes (Signed)
[facility] ROUNDING NOTE   Subjective:   This is a very pleasant 68 year old lady that was admitted with chest pain.  She is end-stage renal disease with a recent stopped on dialysis her first dialysis treatment was 11/15/2018.  She dialyzes Tuesday Thursday Saturday.  Cardiac catheterization status post drug-eluting stent to the proximal RCA 12/03/2018 appreciate assistance from Dr. Gwenlyn Found.  She has been orthostatic and home Coreg and norvasc have been dc'd.   Update Standing BP's 100/60 this am, walked in room w/ minimal lightheadedness.       Objective:  Vital signs in last 24 hours:  Temp:  [97.6 F (36.4 C)-98.8 F (37.1 C)] 98.8 F (37.1 C) (04/15 0804) Pulse Rate:  [72-76] 72 (04/15 0804) Resp:  [18-20] 18 (04/15 0804) BP: (142-173)/(67-100) 152/67 (04/15 0804) SpO2:  [98 %-100 %] 100 % (04/15 0804) Weight:  [90.7 kg] 90.7 kg (04/15 0649)  Weight change:  Filed Weights   12/08/18 1502 12/11/18 0701 12/12/18 0649  Weight: 90.4 kg 92 kg 90.7 kg    Intake/Output: I/O last 3 completed shifts: In: 417 [P.O.:417] Out: -103 [Urine:550]   Intake/Output this shift:  No intake/output data recorded.  Exam:  Alert, no distress, lyilng at 45 deg in bed, pleasant Lying comfortably in bed no distress CVS-regular rate and rhythm RS- CTA no wheezes or rales ABD- BS present soft non-distended EXT- no edema   left AV graft with bruit   HD TTS  4h   400/1.5   91kg  3K/2.25 bath   L AVG  Hep 2000 - venofer 50/wk  - mircera 50 ug every 2 wks, last 3/31   Assessment/ Plan:   Chest pain / hx of cardiomyopathy: sp cardiac catheterization 12/03/2018 appreciate assistance from Dr. Gwenlyn Found with PCI and drug-eluting stent to RCA.  Last 2D echo 09/10/2018 EF 35 to 40%.  Aortic valve sclerotic without stenosis trivial mitral regurgitation.  Patient has been started on Brilinta 90 mg twice daily  ESRD on HD TTS: HD today, no fluid off, let wt's come up. Raise EDW at dc to 92kg,  may need further increases in dry wt.  Pt is probably actively gaining wt as she lost 60 lbs due to uremia prior to starting dialysis so we will be on the watch for signs of vol depletion on HD.  OK for dc home.   Orthostatic hypotension: primarily vol depletion, responding to saline boluses. May have component of autonomic neuropathy, started on midodrine here 5mg  bid.   Volume: no extra vol on exam w/ volume loading, has gained body wt since starting on HD 1 mo ago  Anemia stable slight decrease in hemoglobin, iron saturations appear to be adequate.  She is started on darbepoetin.  40 mcg q. Tuesday  Metabolic bone disease no VDRA at present  Diabetes mellitus as per primary team.  Recent increase in Lantus  Hyperlipidemia continues on Crestor   LOS: 8 Maalik Pinn D Judea Fennimore @TODAY @2 :20 PM   Basic Metabolic Panel: Recent Labs  Lab 12/06/18 0654 12/08/18 1114 12/11/18 0741  NA 138 137 142  K 3.5 3.7 3.5  CL 102 101 106  CO2 24 21* 21*  GLUCOSE 167* 280* 125*  BUN 32* 37* 29*  CREATININE 6.86* 6.06* 5.19*  CALCIUM 8.7* 8.7* 8.8*  PHOS 3.8 4.1  --     Liver Function Tests: Recent Labs  Lab 12/06/18 0654 12/08/18 1114  ALBUMIN 2.7* 2.7*   No results for input(s): LIPASE, AMYLASE in the last 168  hours. No results for input(s): AMMONIA in the last 168 hours.  CBC: Recent Labs  Lab 12/06/18 0654 12/08/18 1116 12/11/18 0816  WBC 10.8* 9.9 9.3  HGB 8.4* 9.0* 8.9*  HCT 26.0* 28.5* 27.9*  MCV 90.0 90.2 90.6  PLT 256 323 342    Cardiac Enzymes: No results for input(s): CKTOTAL, CKMB, CKMBINDEX, TROPONINI in the last 168 hours.  BNP: Invalid input(s): POCBNP  CBG: Recent Labs  Lab 12/11/18 1211 12/11/18 1611 12/11/18 2104 12/12/18 0628 12/12/18 1144  GLUCAP 164* 183* 26* 146* 163*    Microbiology: Results for orders placed or performed during the hospital encounter of 12/01/18  MRSA PCR Screening     Status: None   Collection Time: 12/03/18  8:50 PM   Result Value Ref Range Status   MRSA by PCR NEGATIVE NEGATIVE Final    Comment:        The GeneXpert MRSA Assay (FDA approved for NASAL specimens only), is one component of a comprehensive MRSA colonization surveillance program. It is not intended to diagnose MRSA infection nor to guide or monitor treatment for MRSA infections. Performed at [facility], [place] [address], [place], [place] [address]     Coagulation Studies: No results for input(s): LABPROT, INR in the last 72 hours.  Urinalysis: No results for input(s): COLORURINE, LABSPEC, PHURINE, GLUCOSEU, HGBUR, BILIRUBINUR, KETONESUR, PROTEINUR, UROBILINOGEN, NITRITE, LEUKOCYTESUR in the last 72 hours.  Invalid input(s): APPERANCEUR    Imaging: No results found.   Medications:   . sodium chloride     . aspirin EC  81 mg Oral Daily  . Chlorhexidine Gluconate Cloth  6 each Topical Q0600  . Chlorhexidine Gluconate Cloth  6 each Topical Q0600  . cholecalciferol  5,000 Units Oral Daily  . darbepoetin (ARANESP) injection - DIALYSIS  40 mcg Intravenous Q Tue-HD  . dorzolamide  1 drop Both Eyes BID  . fludrocortisone  0.1 mg Oral Daily  . FLUoxetine  20 mg Oral Daily  . gabapentin  100 mg Oral QHS  . heparin  5,000 Units Subcutaneous Q8H  . insulin aspart  0-5 Units Subcutaneous QHS  . insulin aspart  0-9 Units Subcutaneous TID WC  . insulin detemir  14 Units Subcutaneous Daily  . latanoprost  1 drop Both Eyes QHS  . midodrine  5 mg Oral BID WC  . multivitamin  1 tablet Oral QHS  . pantoprazole  40 mg Oral Daily  . rosuvastatin  40 mg Oral QHS  . ticagrelor  90 mg Oral BID  . vitamin B-12  1,000 mcg Oral Daily   sodium chloride, acetaminophen, albuterol, nitroGLYCERIN, ondansetron (ZOFRAN) IV

## 2018-12-12 NOTE — Progress Notes (Signed)
Dialysis Coordinator notified outpatient HD clinic of patient's pending discharge today for continuity of care.  Alphonzo Cruise Dialysis Coordinator [phone]

## 2018-12-12 NOTE — Progress Notes (Signed)
Physical Therapy Treatment Patient Details Name: Priscilla Davis MRN: [id number] DOB: 07/16/1951 Today's Date: 12/12/2018    History of Present Illness Priscilla Davis is a 68 y.o. female with medical history significant of ESRD recently started on hemodialysis through left AV fistula, cardiomyopathy, abnormal nuclear stress test, EF of 40%, hypertension and diabetes , recently quit a smoker who presents to the emergency room with worsening shortness of breath and chest discomfort for 3 weeks.  Patient had recent hospitalization, abnormal nuclear scan test, was seen outpatient and scheduled for cardiac cath, completed 4/6.    PT Comments    Pt showed able to mobilize and a safe supervision level.  Reinforced standing, taking and "inventory" of how she feels before taking off from the stationary surface.  Sitting down if dizzy.  Emphasized gait and stair training with the cane.    Follow Up Recommendations  Home health PT;Supervision - Intermittent(up to 24/7 supervision as needed)     Equipment Recommendations  Rolling walker with 5" wheels;3in1 (PT)(pt chose no equipment)    Recommendations for Other Services       Precautions / Restrictions Precautions Precautions: Fall(minimal)    Mobility  Bed Mobility               General bed mobility comments: Sitting EOB on arrival(pt has be getting in/out of bed without assist)  Transfers Overall transfer level: Needs assistance Equipment used: Straight cane Transfers: Sit to/from Stand Sit to Stand: Supervision         General transfer comment: safe use of the [facility]  Ambulation/Gait Ambulation/Gait assistance: Supervision Gait Distance (Feet): 250 Feet Assistive device: Straight cane Gait Pattern/deviations: Step-through pattern Gait velocity: moderate speed Gait velocity interpretation: 1.31 - 2.62 ft/sec, indicative of limited community ambulator General Gait Details: steady with great sequencing with the  cane   Stairs Stairs: Yes Stairs assistance: Supervision Stair Management: One rail Right;With cane;Step to pattern;Forwards Number of Stairs: 5 General stair comments: safe with use of the rail.  Sequenced well with the cane   Wheelchair Mobility    Modified Rankin (Stroke Patients Only)       Balance Overall balance assessment: Needs assistance   Sitting balance-Leahy Scale: Good       Standing balance-Leahy Scale: Fair Standing balance comment: cane provides additional safety given the variable dizziness that pt has been experiencing                            Cognition Arousal/Alertness: Awake/alert Behavior During Therapy: WFL for tasks assessed/performed Overall Cognitive Status: Within Functional Limits for tasks assessed                                        Exercises      General Comments        Pertinent Vitals/Pain Pain Assessment: Faces Faces Pain Scale: No hurt    Home Living                      Prior Function            PT Goals (current goals can now be found in the care plan section) Acute Rehab PT Goals Patient Stated Goal: ultimately home independent PT Goal Formulation: With patient Time For Goal Achievement: 12/19/18 Potential to Achieve Goals: Good Progress towards PT goals: Progressing toward goals  Frequency    Min 3X/week      PT Plan Current plan remains appropriate    Co-evaluation              AM-PAC PT "6 Clicks" Mobility   Outcome Measure  Help needed turning from your back to your side while in a flat bed without using bedrails?: None Help needed moving from lying on your back to sitting on the side of a flat bed without using bedrails?: None Help needed moving to and from a bed to a chair (including a wheelchair)?: None Help needed standing up from a chair using your arms (e.g., wheelchair or bedside chair)?: None Help needed to walk in hospital room?:  None Help needed climbing 3-5 steps with a railing? : None 6 Click Score: 24    End of Session   Activity Tolerance: Patient tolerated treatment well Patient left: in bed;with call bell/phone within reach(sitting EOB) Nurse Communication: Mobility status PT Visit Diagnosis: Difficulty in walking, not elsewhere classified (R26.2);Unsteadiness on feet (R26.81)     Time: 5056-9794 PT Time Calculation (min) (ACUTE ONLY): 14 min  Charges:  $Gait Training: 8-22 mins                     12/12/2018  Donnella Sham, PT Acute Rehabilitation Services [phone]  (pager) [phone]  (office)   Priscilla Davis 12/12/2018, 5:50 PM

## 2018-12-18 ENCOUNTER — Emergency Department (HOSPITAL_COMMUNITY): Payer: Medicare Other

## 2018-12-18 ENCOUNTER — Other Ambulatory Visit: Payer: Self-pay

## 2018-12-18 ENCOUNTER — Observation Stay (HOSPITAL_COMMUNITY)
Admission: EM | Admit: 2018-12-18 | Discharge: 2018-12-19 | Disposition: A | Discharge status: Home / Self Care | Payer: Medicare Other | Attending: Internal Medicine | Admitting: Internal Medicine

## 2018-12-18 ENCOUNTER — Encounter (HOSPITAL_COMMUNITY): Payer: Self-pay

## 2018-12-18 DIAGNOSIS — Z79899 Other long term (current) drug therapy: Secondary | ICD-10-CM | POA: Diagnosis not present

## 2018-12-18 DIAGNOSIS — Z955 Presence of coronary angioplasty implant and graft: Secondary | ICD-10-CM | POA: Diagnosis not present

## 2018-12-18 DIAGNOSIS — N186 End stage renal disease: Secondary | ICD-10-CM | POA: Insufficient documentation

## 2018-12-18 DIAGNOSIS — J4520 Mild intermittent asthma, uncomplicated: Secondary | ICD-10-CM | POA: Diagnosis not present

## 2018-12-18 DIAGNOSIS — E785 Hyperlipidemia, unspecified: Secondary | ICD-10-CM | POA: Insufficient documentation

## 2018-12-18 DIAGNOSIS — K219 Gastro-esophageal reflux disease without esophagitis: Secondary | ICD-10-CM | POA: Insufficient documentation

## 2018-12-18 DIAGNOSIS — G8929 Other chronic pain: Secondary | ICD-10-CM | POA: Diagnosis not present

## 2018-12-18 DIAGNOSIS — F419 Anxiety disorder, unspecified: Secondary | ICD-10-CM | POA: Diagnosis not present

## 2018-12-18 DIAGNOSIS — E1122 Type 2 diabetes mellitus with diabetic chronic kidney disease: Secondary | ICD-10-CM | POA: Diagnosis not present

## 2018-12-18 DIAGNOSIS — I252 Old myocardial infarction: Secondary | ICD-10-CM | POA: Insufficient documentation

## 2018-12-18 DIAGNOSIS — F329 Major depressive disorder, single episode, unspecified: Secondary | ICD-10-CM | POA: Diagnosis not present

## 2018-12-18 DIAGNOSIS — R0602 Shortness of breath: Principal | ICD-10-CM | POA: Insufficient documentation

## 2018-12-18 DIAGNOSIS — Z8249 Family history of ischemic heart disease and other diseases of the circulatory system: Secondary | ICD-10-CM | POA: Insufficient documentation

## 2018-12-18 DIAGNOSIS — Z794 Long term (current) use of insulin: Secondary | ICD-10-CM | POA: Diagnosis not present

## 2018-12-18 DIAGNOSIS — Z87891 Personal history of nicotine dependence: Secondary | ICD-10-CM | POA: Diagnosis not present

## 2018-12-18 DIAGNOSIS — E876 Hypokalemia: Secondary | ICD-10-CM | POA: Diagnosis not present

## 2018-12-18 DIAGNOSIS — I132 Hypertensive heart and chronic kidney disease with heart failure and with stage 5 chronic kidney disease, or end stage renal disease: Secondary | ICD-10-CM | POA: Diagnosis not present

## 2018-12-18 DIAGNOSIS — I5042 Chronic combined systolic (congestive) and diastolic (congestive) heart failure: Secondary | ICD-10-CM | POA: Diagnosis not present

## 2018-12-18 DIAGNOSIS — Z992 Dependence on renal dialysis: Secondary | ICD-10-CM | POA: Insufficient documentation

## 2018-12-18 DIAGNOSIS — I255 Ischemic cardiomyopathy: Secondary | ICD-10-CM | POA: Insufficient documentation

## 2018-12-18 DIAGNOSIS — I447 Left bundle-branch block, unspecified: Secondary | ICD-10-CM | POA: Diagnosis not present

## 2018-12-18 DIAGNOSIS — Z7982 Long term (current) use of aspirin: Secondary | ICD-10-CM | POA: Insufficient documentation

## 2018-12-18 DIAGNOSIS — I2510 Atherosclerotic heart disease of native coronary artery without angina pectoris: Secondary | ICD-10-CM | POA: Insufficient documentation

## 2018-12-18 DIAGNOSIS — E8889 Other specified metabolic disorders: Secondary | ICD-10-CM | POA: Insufficient documentation

## 2018-12-18 DIAGNOSIS — R079 Chest pain, unspecified: Secondary | ICD-10-CM | POA: Diagnosis present

## 2018-12-18 DIAGNOSIS — I951 Orthostatic hypotension: Secondary | ICD-10-CM | POA: Diagnosis not present

## 2018-12-18 DIAGNOSIS — N2581 Secondary hyperparathyroidism of renal origin: Secondary | ICD-10-CM | POA: Insufficient documentation

## 2018-12-18 DIAGNOSIS — D631 Anemia in chronic kidney disease: Secondary | ICD-10-CM | POA: Insufficient documentation

## 2018-12-18 HISTORY — DX: Acute combined systolic (congestive) and diastolic (congestive) heart failure: I50.41

## 2018-12-18 LAB — CBC WITH DIFFERENTIAL/PLATELET
Abs Immature Granulocytes: 0.05 10*3/uL (ref 0.00–0.07)
Basophils Absolute: 0 10*3/uL (ref 0.0–0.1)
Basophils Relative: 0 %
Eosinophils Absolute: 0.4 10*3/uL (ref 0.0–0.5)
Eosinophils Relative: 4 %
HCT: 31.8 % — ABNORMAL LOW (ref 36.0–46.0)
Hemoglobin: 9.9 g/dL — ABNORMAL LOW (ref 12.0–15.0)
Immature Granulocytes: 1 %
Lymphocytes Relative: 21 %
Lymphs Abs: 2 10*3/uL (ref 0.7–4.0)
MCH: 29.1 pg (ref 26.0–34.0)
MCHC: 31.1 g/dL (ref 30.0–36.0)
MCV: 93.5 fL (ref 80.0–100.0)
Monocytes Absolute: 0.5 10*3/uL (ref 0.1–1.0)
Monocytes Relative: 5 %
Neutro Abs: 6.6 10*3/uL (ref 1.7–7.7)
Neutrophils Relative %: 69 %
Platelets: 368 10*3/uL (ref 150–400)
RBC: 3.4 MIL/uL — ABNORMAL LOW (ref 3.87–5.11)
RDW: 16.2 % — ABNORMAL HIGH (ref 11.5–15.5)
WBC: 9.5 10*3/uL (ref 4.0–10.5)
nRBC: 0.3 % — ABNORMAL HIGH (ref 0.0–0.2)

## 2018-12-18 LAB — TROPONIN I
Troponin I: <0.03 ng/mL (ref <0.03)
Troponin I: <0.03 ng/mL (ref <0.03)

## 2018-12-18 LAB — BASIC METABOLIC PANEL
Anion gap: 13 (ref 5–15)
BUN: 11 mg/dL (ref 8–23)
CO2: 25 mmol/L (ref 22–32)
Calcium: 8.5 mg/dL — ABNORMAL LOW (ref 8.9–10.3)
Chloride: 97 mmol/L — ABNORMAL LOW (ref 98–111)
Creatinine, Ser: 2.49 mg/dL — ABNORMAL HIGH (ref 0.44–1.00)
GFR calc Af Amer: 22 mL/min — ABNORMAL LOW (ref >60)
GFR calc non Af Amer: 19 mL/min — ABNORMAL LOW (ref >60)
Glucose, Bld: 209 mg/dL — ABNORMAL HIGH (ref 70–99)
Potassium: 3.2 mmol/L — ABNORMAL LOW (ref 3.5–5.1)
Sodium: 135 mmol/L (ref 135–145)

## 2018-12-18 LAB — D-DIMER, QUANTITATIVE: D-Dimer, Quant: 2.74 ug/mL-FEU — ABNORMAL HIGH (ref 0.00–0.50)

## 2018-12-18 LAB — GLUCOSE, CAPILLARY: Glucose-Capillary: 159 mg/dL — ABNORMAL HIGH (ref 70–99)

## 2018-12-18 MED ORDER — IOHEXOL 350 MG/ML SOLN
63.0000 mL | Freq: Once | INTRAVENOUS | Status: AC | PRN
  Administered 2018-12-18: 19:00:00 63 mL via INTRAVENOUS

## 2018-12-18 MED ORDER — MIDODRINE HCL 5 MG PO TABS
5.0000 mg | ORAL_TABLET | Freq: Two times a day (BID) | ORAL | Status: DC
  Administered 2018-12-19: 5 mg via ORAL
  Filled 2018-12-18: qty 1

## 2018-12-18 MED ORDER — ADULT MULTIVITAMIN W/MINERALS CH
1.0000 | ORAL_TABLET | Freq: Every day | ORAL | Status: DC
  Administered 2018-12-19: 1 via ORAL
  Filled 2018-12-18: qty 1

## 2018-12-18 MED ORDER — POTASSIUM CHLORIDE 20 MEQ PO PACK
10.0000 meq | PACK | Freq: Once | ORAL | Status: AC
  Administered 2018-12-18: 10 meq via ORAL
  Filled 2018-12-18: qty 1

## 2018-12-18 MED ORDER — DORZOLAMIDE HCL 2 % OP SOLN
1.0000 [drp] | Freq: Two times a day (BID) | OPHTHALMIC | Status: DC
  Administered 2018-12-18 – 2018-12-19 (×2): 1 [drp] via OPHTHALMIC
  Filled 2018-12-18: qty 10

## 2018-12-18 MED ORDER — TICAGRELOR 90 MG PO TABS
90.0000 mg | ORAL_TABLET | Freq: Two times a day (BID) | ORAL | Status: DC
  Administered 2018-12-18 – 2018-12-19 (×2): 90 mg via ORAL
  Filled 2018-12-18 (×2): qty 1

## 2018-12-18 MED ORDER — ALBUTEROL SULFATE (2.5 MG/3ML) 0.083% IN NEBU: 3.0000 mL | INHALATION_SOLUTION | Freq: Four times a day (QID) | RESPIRATORY_TRACT | Status: DC | PRN

## 2018-12-18 MED ORDER — ASPIRIN EC 81 MG PO TBEC
81.0000 mg | DELAYED_RELEASE_TABLET | Freq: Every day | ORAL | Status: DC
  Administered 2018-12-19: 10:00:00 81 mg via ORAL
  Filled 2018-12-18: qty 1

## 2018-12-18 MED ORDER — VITAMIN B-12 1000 MCG PO TABS
1000.0000 ug | ORAL_TABLET | Freq: Every day | ORAL | Status: DC
  Administered 2018-12-19: 10:00:00 1000 ug via ORAL
  Filled 2018-12-18: qty 1

## 2018-12-18 MED ORDER — LATANOPROST 0.005 % OP SOLN
1.0000 [drp] | Freq: Every day | OPHTHALMIC | Status: DC
  Administered 2018-12-18: 1 [drp] via OPHTHALMIC
  Filled 2018-12-18: qty 2.5

## 2018-12-18 MED ORDER — ONDANSETRON HCL 4 MG/2ML IJ SOLN: 4.0000 mg | Freq: Four times a day (QID) | INTRAMUSCULAR | Status: DC | PRN

## 2018-12-18 MED ORDER — INSULIN DETEMIR 100 UNIT/ML ~~LOC~~ SOLN
15.0000 [IU] | Freq: Every day | SUBCUTANEOUS | Status: DC
  Administered 2018-12-18: 23:00:00 15 [IU] via SUBCUTANEOUS
  Filled 2018-12-18 (×2): qty 0.15

## 2018-12-18 MED ORDER — ROSUVASTATIN CALCIUM 20 MG PO TABS
40.0000 mg | ORAL_TABLET | Freq: Every day | ORAL | Status: DC
  Administered 2018-12-18: 40 mg via ORAL
  Filled 2018-12-18: qty 2

## 2018-12-18 MED ORDER — GABAPENTIN 100 MG PO CAPS
100.0000 mg | ORAL_CAPSULE | Freq: Every day | ORAL | Status: DC
  Administered 2018-12-19: 100 mg via ORAL
  Filled 2018-12-18: qty 1

## 2018-12-18 MED ORDER — VITAMIN D 25 MCG (1000 UNIT) PO TABS
5000.0000 [IU] | ORAL_TABLET | Freq: Every day | ORAL | Status: DC
  Administered 2018-12-19: 5000 [IU] via ORAL
  Filled 2018-12-18: qty 5

## 2018-12-18 MED ORDER — HEPARIN SODIUM (PORCINE) 5000 UNIT/ML IJ SOLN
5000.0000 [IU] | Freq: Three times a day (TID) | INTRAMUSCULAR | Status: DC
  Administered 2018-12-18 – 2018-12-19 (×2): 5000 [IU] via SUBCUTANEOUS
  Filled 2018-12-18 (×2): qty 1

## 2018-12-18 MED ORDER — PANTOPRAZOLE SODIUM 40 MG PO TBEC
40.0000 mg | DELAYED_RELEASE_TABLET | Freq: Every day | ORAL | Status: DC
  Administered 2018-12-19: 40 mg via ORAL
  Filled 2018-12-18: qty 1

## 2018-12-18 MED ORDER — FLUOXETINE HCL 20 MG PO CAPS
20.0000 mg | ORAL_CAPSULE | Freq: Every day | ORAL | Status: DC
  Administered 2018-12-19: 10:00:00 20 mg via ORAL
  Filled 2018-12-18: qty 1

## 2018-12-18 MED ORDER — ACETAMINOPHEN 325 MG PO TABS: 650.0000 mg | ORAL_TABLET | ORAL | Status: DC | PRN

## 2018-12-18 MED ORDER — INSULIN ASPART 100 UNIT/ML ~~LOC~~ SOLN
0.0000 [IU] | Freq: Three times a day (TID) | SUBCUTANEOUS | Status: DC
  Administered 2018-12-19 (×2): 2 [IU] via SUBCUTANEOUS

## 2018-12-18 NOTE — ED Notes (Signed)
ED TO INPATIENT HANDOFF REPORT  ED Nurse Name and Phone #: Caryl Pina [phone]  S Name/Age/Gender Priscilla Davis 68 y.o. female Room/Bed: 028C/028C  Code Status   Code Status: Full Code  Home/SNF/Other Home Patient oriented to: self, place, time and situation Is this baseline? Yes   Triage Complete: Triage complete  Chief Complaint [facility] after Dialysis, No fever of cough  Triage Note Pt from home with ems for generalized weakness and SOB that started once she got home from dialysis today. Pts BP dropped into the 50P systolic during dialysis and ended her session 30 mins early due to her drop in BP. Pt used her albuterol MDI prior to ems arrived. Pt arrives to ed a.o, resp e/u, nad noted at this time.    Allergies No Known Allergies  Level of Care/Admitting Diagnosis ED Disposition    ED Disposition Condition Comment   Admit  Hospital Area: [place] [[id number]]  Level of Care: Telemetry Cardiac [103]  I expect the patient will be discharged within 24 hours: Yes  LOW acuity---Tx typically complete <24 hrs---ACUTE conditions typically can be evaluated <24 hours---LABS likely to return to acceptable levels <24 hours---IS near functional baseline---EXPECTED to return to current living arrangement---NOT newly hypoxic: Does not meet criteria for 5C-Observation unit  Covid Evaluation: N/A  Diagnosis: Chest pain [[id number]]  Admitting Physician: Joelyn Oms [[id number]]  Attending Physician: Joelyn Oms [[id number]]  PT Class (Do Not Modify): Observation [104]  PT Acc Code (Do Not Modify): Observation [10022]       B Medical/Surgery History Past Medical History:  Diagnosis Date  . Arthritis   . Asthma   . Depression   . Diabetes mellitus without complication ([place])    Type II  . GERD (gastroesophageal reflux disease)   . Hypertension   . Renal disorder    CKD 5   Past Surgical History:  Procedure Laterality Date  . AV FISTULA PLACEMENT Left 06/19/2018    Procedure: INSERTION OF 4-7MM X 45CM ARTERIOVENOUS (AV) GORE-TEX GRAFT LEFT  ARM;  Surgeon: Elam Dutch, MD;  Location: [place];  Service: Vascular;  Laterality: Left;  . CESAREAN SECTION    . CHOLECYSTECTOMY    . COLONOSCOPY    . CORONARY STENT INTERVENTION N/A 12/03/2018   Procedure: CORONARY STENT INTERVENTION;  Surgeon: Lorretta Harp, MD;  Location: [facility] [facility];  Service: Cardiovascular;  Laterality: N/A;  . EYE SURGERY Bilateral    diabetic   . LEFT HEART CATH AND CORONARY ANGIOGRAPHY N/A 12/03/2018   Procedure: LEFT HEART CATH AND CORONARY ANGIOGRAPHY;  Surgeon: Lorretta Harp, MD;  Location: [place] [facility];  Service: Cardiovascular;  Laterality: N/A;  . toe amputation Right    5th toe     A IV Location/Drains/Wounds Patient Lines/Drains/Airways Status   Active Line/Drains/Airways    Name:   Placement date:   Placement time:   Site:   Days:   Peripheral IV 09/28/18 Right Antecubital   09/28/18    1308    Antecubital   81   Peripheral IV 12/18/18 Right Antecubital   12/18/18    1825    Antecubital   less than 1   Fistula / Graft Left Forearm   12/04/18    0851    Forearm   14   Incision (Closed) 06/19/18 Arm Left   06/19/18    1047     182          Intake/Output Last 24  hours No intake or output data in the 24 hours ending 12/18/18 2122  Labs/Imaging Results for orders placed or performed during the hospital encounter of 12/18/18 (from the past 48 hour(s))  Basic metabolic panel     Status: Abnormal   Collection Time: 12/18/18  5:22 PM  Result Value Ref Range   Sodium 135 135 - 145 mmol/L   Potassium 3.2 (L) 3.5 - 5.1 mmol/L   Chloride 97 (L) 98 - 111 mmol/L   CO2 25 22 - 32 mmol/L   Glucose, Bld 209 (H) 70 - 99 mg/dL   BUN 11 8 - 23 mg/dL   Creatinine, Ser 2.49 (H) 0.44 - 1.00 mg/dL   Calcium 8.5 (L) 8.9 - 10.3 mg/dL   GFR calc non Af Amer 19 (L) >60 mL/min   GFR calc Af Amer 22 (L) >60 mL/min   Anion gap 13 5 - 15    Comment: Performed at [facility], [address], [place], [place] [address]  CBC with Differential     Status: Abnormal   Collection Time: 12/18/18  5:22 PM  Result Value Ref Range   WBC 9.5 4.0 - 10.5 K/uL   RBC 3.40 (L) 3.87 - 5.11 MIL/uL   Hemoglobin 9.9 (L) 12.0 - 15.0 g/dL   HCT 31.8 (L) 36.0 - 46.0 %   MCV 93.5 80.0 - 100.0 fL   MCH 29.1 26.0 - 34.0 pg   MCHC 31.1 30.0 - 36.0 g/dL   RDW 16.2 (H) 11.5 - 15.5 %   Platelets 368 150 - 400 K/uL   nRBC 0.3 (H) 0.0 - 0.2 %   Neutrophils Relative % 69 %   Neutro Abs 6.6 1.7 - 7.7 K/uL   Lymphocytes Relative 21 %   Lymphs Abs 2.0 0.7 - 4.0 K/uL   Monocytes Relative 5 %   Monocytes Absolute 0.5 0.1 - 1.0 K/uL   Eosinophils Relative 4 %   Eosinophils Absolute 0.4 0.0 - 0.5 K/uL   Basophils Relative 0 %   Basophils Absolute 0.0 0.0 - 0.1 K/uL   Immature Granulocytes 1 %   Abs Immature Granulocytes 0.05 0.00 - 0.07 K/uL    Comment: Performed at [facility] [address] [place], [place] [address]  Troponin I - ONCE - STAT     Status: None   Collection Time: 12/18/18  5:22 PM  Result Value Ref Range   Troponin I <0.03 <0.03 ng/mL    Comment: Performed at [facility] [address] [place], [place] [address]  D-dimer, quantitative (not at [facility])     Status: Abnormal   Collection Time: 12/18/18  5:22 PM  Result Value Ref Range   D-Dimer, Quant 2.74 (H) 0.00 - 0.50 ug/mL-FEU    Comment: (NOTE) At the manufacturer cut-off of 0.50 ug/mL FEU, this assay has been documented to exclude PE with a sensitivity and negative predictive value of 97 to 99%.  At this time, this assay has not been approved by the FDA to exclude DVT/VTE. Results should be correlated with clinical presentation. Performed at [facility], [place] [address] [place],  [address]    Ct Angio Chest Pe W And/or Wo Contrast  Result Date: 12/18/2018 CLINICAL DATA:  Shortness of breath EXAM: CT ANGIOGRAPHY CHEST WITH CONTRAST TECHNIQUE: Multidetector CT imaging  of the chest was performed using the standard protocol during bolus administration of intravenous contrast. Multiplanar CT image reconstructions and MIPs were obtained to evaluate  the vascular anatomy. CONTRAST:  62mL OMNIPAQUE IOHEXOL 350 MG/ML SOLN COMPARISON:  Chest x-ray today FINDINGS: Cardiovascular: Heart is mildly enlarged. Aorta is normal caliber. Moderate coronary artery calcifications. No filling defects in the pulmonary arteries to suggestpulmonary emboli. Mediastinum/Nodes: No mediastinal, hilar, or axillary adenopathy. Lungs/Pleura: Lungs are clear. No focal airspace opacities or suspicious nodules. No effusions. Upper Abdomen: Imaging into the upper abdomen shows no acute findings. Punctate calcifications in the upper pole of the left kidney. Musculoskeletal: Chest wall soft tissues are unremarkable. No acute bony abnormality. Review of the MIP images confirms the above findings. IMPRESSION: Mild cardiomegaly.  Coronary artery disease. Negative for pulmonary embolus. No acute cardiopulmonary disease. Electronically Signed   By: Rolm Baptise M.D.   On: 12/18/2018 19:14   Dg Chest Port 1 View  Result Date: 12/18/2018 CLINICAL DATA:  Short of breath and chest pain EXAM: PORTABLE CHEST 1 VIEW COMPARISON:  12/01/2018 FINDINGS: Cardiac enlargement without heart failure. Chronic lung disease with prominent lung markings. No acute infiltrate or effusion. Negative for mass lesion. IMPRESSION: Prominent lung markings appear chronic. No superimposed acute abnormality. Electronically Signed   By: Franchot Gallo M.D.   On: 12/18/2018 17:37    Pending Labs Unresulted Labs (From admission, onward)    Start     Ordered   12/19/18 0500  Hemoglobin A1c  Tomorrow morning,   R    Comments:  To assess prior glycemic control    12/18/18 2116   12/18/18 2104  Troponin I - Now Then Q6H  Now then every 6 hours,   TIMED     12/18/18 2105          Vitals/Pain Today's Vitals   12/18/18 1646 12/18/18 1700  12/18/18 1831 12/18/18 1833  BP: 129/60 120/60 118/62 (!) 145/66  Pulse: 81 81 88 75  Resp: 15 12 16 19   Temp: 98.3 F (36.8 C)     TempSrc: Oral     SpO2: 100% 99% 100% 100%  Weight: 87.5 kg     Height: 5\' 9"  (1.753 m)     PainSc: 0-No pain       Isolation Precautions No active isolations  Medications Medications  aspirin EC tablet 81 mg (has no administration in time range)  midodrine (PROAMATINE) tablet 5 mg (has no administration in time range)  rosuvastatin (CRESTOR) tablet 40 mg (has no administration in time range)  FLUoxetine (PROZAC) capsule 20 mg (has no administration in time range)  insulin detemir (LEVEMIR) injection 15 Units (has no administration in time range)  pantoprazole (PROTONIX) EC tablet 40 mg (has no administration in time range)  ticagrelor (BRILINTA) tablet 90 mg (has no administration in time range)  vitamin B-12 (CYANOCOBALAMIN) tablet 1,000 mcg (has no administration in time range)  gabapentin (NEURONTIN) capsule 100 mg (has no administration in time range)  Vitamin D CAPS 5,000 Units (has no administration in time range)  multivitamin with minerals tablet 1 tablet (has no administration in time range)  albuterol (VENTOLIN HFA) 108 (90 Base) MCG/ACT inhaler 2 puff (has no administration in time range)  latanoprost (XALATAN) 0.005 % ophthalmic solution 1 drop (has no administration in time range)  dorzolamide (TRUSOPT) 2 % ophthalmic solution 1 drop (has no administration in time range)  acetaminophen (TYLENOL) tablet 650 mg (has no administration in time range)  ondansetron (ZOFRAN) injection 4 mg (has no administration in time range)  heparin injection 5,000 Units (has no administration in time range)  insulin aspart (novoLOG) injection 0-9 Units (has  no administration in time range)  iohexol (OMNIPAQUE) 350 MG/ML injection 63 mL (63 mLs Intravenous Contrast Given 12/18/18 1855)    Mobility walks with device Low fall risk   Focused  Assessments Cardiac Assessment Handoff:    Lab Results  Component Value Date   TROPONINI <0.03 12/18/2018   Lab Results  Component Value Date   DDIMER 2.74 (H) 12/18/2018   Does the Patient currently have chest pain? No  , Pulmonary Assessment Handoff:  Lung sounds: Bilateral Breath Sounds: Diminished, Clear L Breath Sounds: Clear, Diminished R Breath Sounds: Clear, Diminished O2 Device: Room Air        R Recommendations: See Admitting Provider Note  Report given to:   Additional Notes:

## 2018-12-18 NOTE — ED Notes (Signed)
X RAY at bedside 

## 2018-12-18 NOTE — ED Triage Notes (Signed)
Pt from home with ems for generalized weakness and SOB that started once she got home from dialysis today. Pts BP dropped into the 58I systolic during dialysis and ended her session 30 mins early due to her drop in BP. Pt used her albuterol MDI prior to ems arrived. Pt arrives to ed a.o, resp e/u, nad noted at this time.

## 2018-12-18 NOTE — ED Provider Notes (Signed)
[facility] Provider Note   CSN: [id number] Arrival date & time: 12/18/18  1638    History   Chief Complaint Chief Complaint  Patient presents with  . Shortness of Breath  . Weakness    HPI Priscilla Davis is a 68 y.o. female with history of ESRD dialysis, PCI with stent placement on 12/03/2018, CHF, diabetes, HTN presented today for concern of shortness of breath and chest tightness.  Patient reports that when she returned home from dialysis today she was walking up the steps at her home when she had sudden onset of shortness of breath.  She describes her shortness of breath as the inability to take a full breath accompanied with a tightness along her lower ribs bilaterally she states that this is been present since onset at 3 PM however has gradually improved and she endorses minimal discomfort at this time.    Of note patient reports that her dialysis session today was ended 30 minutes early due to hypotension and she was subsequently given a small fluid bolus and had no side effects regarding this.  She reports her shortness of breath did not begin until after she had arrived home and was walking up the stairs.  Patient denies any other concern today she denies any history of fever/chills, cough/hemoptysis, radiation of pain/tightness, abdominal pain, nausea/vomiting or any additional concerns.  Patient denies contact with known COVID-19 patients. ---------------------------- Left Heart Cath and Coronary Angiography 12/03/2018 Conclusion:  Ost 2nd Mrg to 2nd Mrg lesion is 100% stenosed.  Ost 3rd Mrg lesion is 100% stenosed.  Prox RCA to Mid RCA lesion is 40% stenosed.  Dist RCA lesion is 95% stenosed.  A drug-eluting stent was successfully placed.  Post intervention, there is a 0% residual stenosis.  There is moderate left ventricular systolic dysfunction.  LV end diastolic pressure is mildly elevated.  The left ventricular ejection  fraction is 45-50% by visual estimate.    HPI  Past Medical History:  Diagnosis Date  . Arthritis   . Asthma   . Depression   . Diabetes mellitus without complication ([place])    Type II  . GERD (gastroesophageal reflux disease)   . Hypertension   . Renal disorder    CKD 5    Patient Active Problem List   Diagnosis Date Noted  . Status post coronary artery stent placement   . Orthostatic hypotension   . Hyperlipidemia with target LDL less than 70   . NSTEMI (non-ST elevated myocardial infarction) ([place])   . ESRD (end stage renal disease) ([place]) 12/01/2018  . Chest pain 12/01/2018  . Acute combined systolic and diastolic heart failure ([place]) 09/24/2018  . Cardiomyopathy ([place]) 09/24/2018  . Essential hypertension   . Mild intermittent asthma 09/07/2018  . Type II diabetes mellitus with HbA1C goal to be determined ([place]) 09/07/2018  . Smoker 09/07/2018    Past Surgical History:  Procedure Laterality Date  . AV FISTULA PLACEMENT Left 06/19/2018   Procedure: INSERTION OF 4-7MM X 45CM ARTERIOVENOUS (AV) GORE-TEX GRAFT LEFT  ARM;  Surgeon: Elam Dutch, MD;  Location: [facility];  Service: Vascular;  Laterality: Left;  . CESAREAN SECTION    . CHOLECYSTECTOMY    . COLONOSCOPY    . CORONARY STENT INTERVENTION N/A 12/03/2018   Procedure: CORONARY STENT INTERVENTION;  Surgeon: Lorretta Harp, MD;  Location: [facility];  Service: Cardiovascular;  Laterality: N/A;  . EYE SURGERY Bilateral    diabetic   . LEFT  HEART CATH AND CORONARY ANGIOGRAPHY N/A 12/03/2018   Procedure: LEFT HEART CATH AND CORONARY ANGIOGRAPHY;  Surgeon: Lorretta Harp, MD;  Location: [facility];  Service: Cardiovascular;  Laterality: N/A;  . toe amputation Right    5th toe     OB History   No obstetric history on file.      Home Medications    Prior to Admission medications   Medication Sig Start Date End Date Taking? Authorizing Provider  acetaminophen (TYLENOL) 500 MG tablet Take 1,000  mg by mouth every 6 (six) hours as needed for fever or pain.     [provider]  albuterol (PROVENTIL HFA;VENTOLIN HFA) 108 (90 Base) MCG/ACT inhaler Inhale 2 puffs into the lungs every 6 (six) hours as needed for wheezing or shortness of breath.    [provider]  aspirin EC 81 MG tablet Take 81 mg by mouth daily.    [provider]  Cholecalciferol (VITAMIN D) 125 MCG (5000 UT) CAPS Take 5,000 Units by mouth daily.     [provider]  dorzolamide (TRUSOPT) 2 % ophthalmic solution Place 1 drop into both eyes 2 (two) times daily.    [provider]  FLUoxetine (PROZAC) 20 MG capsule Take 20 mg by mouth daily. 05/22/17   [provider]  gabapentin (NEURONTIN) 100 MG capsule Take 1 capsule (100 mg total) by mouth daily. 12/09/18   Guilford Shi, MD  insulin detemir (LEVEMIR) 100 UNIT/ML injection Inject 0.15 mLs (15 Units total) into the skin daily. Patient taking differently: Inject 15 Units into the skin at bedtime.  09/12/18   Ghimire, Henreitta Leber, MD  latanoprost (XALATAN) 0.005 % ophthalmic solution Place 1 drop into both eyes at bedtime.    [provider]  lidocaine-prilocaine (EMLA) cream Apply 1 application topically Every Tuesday,Thursday,and Saturday with dialysis. 11/14/18   [provider]  liraglutide (VICTOZA) 18 MG/3ML SOPN Inject 1.8 mg into the skin every evening.     [provider]  midodrine (PROAMATINE) 5 MG tablet Take 1 tablet (5 mg total) by mouth 2 (two) times daily with a meal for 30 days. 12/12/18 01/11/19  Domenic Polite, MD  Multiple Vitamin (MULTIVITAMIN WITH MINERALS) TABS tablet Take 1 tablet by mouth daily. woman's    [provider]  omeprazole (PRILOSEC) 20 MG capsule Take 40 mg by mouth daily.     [provider]  rosuvastatin (CRESTOR) 40 MG tablet Take 40 mg by mouth at bedtime.     [provider]  ticagrelor (BRILINTA) 90 MG TABS tablet Take 1 tablet (90  mg total) by mouth 2 (two) times daily. 12/12/18   Domenic Polite, MD  vitamin B-12 (CYANOCOBALAMIN) 1000 MCG tablet Take 1,000 mcg by mouth daily.    [provider]    Family History Family History  Problem Relation Age of Onset  . Peripheral vascular disease Mother   . Hypertension Mother     Social History Social History   Tobacco Use  . Smoking status: Former Smoker    Packs/day: 0.25    Years: 30.00    Pack years: 7.50    Types: Cigarettes    Last attempt to quit: 09/17/2018    Years since quitting: 0.2  . Smokeless tobacco: Never Used  Substance Use Topics  . Alcohol use: Yes    Comment: seldom  . Drug use: Never     Allergies   Patient has no known allergies.   Review of Systems Review  of Systems  Constitutional: Negative.  Negative for chills, fatigue and fever.  Respiratory: Positive for chest tightness and shortness of breath. Negative for cough and stridor.   Cardiovascular: Positive for chest pain (Tightness of lower chest.). Negative for palpitations and leg swelling.  Gastrointestinal: Negative.  Negative for abdominal pain, nausea and vomiting.  Musculoskeletal: Negative.  Negative for arthralgias and myalgias.  Neurological: Negative.  Negative for dizziness, weakness and headaches.  All other systems reviewed and are negative.  Physical Exam Updated Vital Signs BP (!) 145/66   Pulse 75   Temp 98.3 F (36.8 C) (Oral)   Resp 19   Ht 5\' 9"  (1.753 m)   Wt 87.5 kg   SpO2 100%   BMI 28.50 kg/m   Physical Exam Constitutional:      General: She is not in acute distress.    Appearance: Normal appearance. She is well-developed. She is not ill-appearing or diaphoretic.  HENT:     Head: Normocephalic and atraumatic.     Right Ear: External ear normal.     Left Ear: External ear normal.     Nose: Nose normal.     Mouth/Throat:     Mouth: Mucous membranes are moist.     Pharynx: Oropharynx is clear.  Eyes:     General: Vision grossly  intact. Gaze aligned appropriately.     Pupils: Pupils are equal, round, and reactive to light.  Neck:     Musculoskeletal: Normal range of motion.     Trachea: Trachea and phonation normal. No tracheal deviation.  Cardiovascular:     Rate and Rhythm: Normal rate and regular rhythm.     Pulses:          Dorsalis pedis pulses are 2+ on the right side and 2+ on the left side.       Posterior tibial pulses are 2+ on the right side and 2+ on the left side.  Pulmonary:     Effort: Pulmonary effort is normal. No tachypnea, accessory muscle usage or respiratory distress.     Breath sounds: Normal breath sounds.  Chest:     Chest wall: Tenderness present. No deformity or crepitus.  Abdominal:     General: There is no distension.     Palpations: Abdomen is soft.     Tenderness: There is no abdominal tenderness. There is no guarding or rebound.  Musculoskeletal: Normal range of motion.     Right lower leg: Normal. She exhibits no tenderness. No edema.     Left lower leg: Normal. She exhibits no tenderness. No edema.  Skin:    General: Skin is warm and dry.  Neurological:     Mental Status: She is alert.     GCS: GCS eye subscore is 4. GCS verbal subscore is 5. GCS motor subscore is 6.     Comments: Speech is clear and goal oriented, follows commands Major Cranial nerves without deficit, no facial droop Moves extremities without ataxia, coordination intact  Psychiatric:        Behavior: Behavior normal.    ED Treatments / Results  Labs (all labs ordered are listed, but only abnormal results are displayed) Labs Reviewed  BASIC METABOLIC PANEL - Abnormal; Notable for the following components:      Result Value   Potassium 3.2 (*)    Chloride 97 (*)    Glucose, Bld 209 (*)    Creatinine, Ser 2.49 (*)    Calcium 8.5 (*)    GFR calc non  Af Amer 19 (*)    GFR calc Af Amer 22 (*)    All other components within normal limits  CBC WITH DIFFERENTIAL/PLATELET - Abnormal; Notable for the  following components:   RBC 3.40 (*)    Hemoglobin 9.9 (*)    HCT 31.8 (*)    RDW 16.2 (*)    nRBC 0.3 (*)    All other components within normal limits  D-DIMER, QUANTITATIVE (NOT AT [facility]) - Abnormal; Notable for the following components:   D-Dimer, Quant 2.74 (*)    All other components within normal limits  TROPONIN I  TROPONIN I  TROPONIN I  TROPONIN I  HEMOGLOBIN A1C    EKG EKG Interpretation  Date/Time:  Tuesday December 18 2018 16:45:50 EDT Ventricular Rate:  82 PR Interval:    QRS Duration: 108 QT Interval:  482 QTC Calculation: 563 R Axis:   -4 Text Interpretation:  Sinus rhythm Incomplete left bundle branch block Prolonged QT interval similar to previous Confirmed by Theotis Burrow [phone]) on 12/18/2018 5:00:50 PM   Radiology Ct Angio Chest Pe W And/or Wo Contrast  Result Date: 12/18/2018 CLINICAL DATA:  Shortness of breath EXAM: CT ANGIOGRAPHY CHEST WITH CONTRAST TECHNIQUE: Multidetector CT imaging of the chest was performed using the standard protocol during bolus administration of intravenous contrast. Multiplanar CT image reconstructions and MIPs were obtained to evaluate the vascular anatomy. CONTRAST:  68mL OMNIPAQUE IOHEXOL 350 MG/ML SOLN COMPARISON:  Chest x-ray today FINDINGS: Cardiovascular: Heart is mildly enlarged. Aorta is normal caliber. Moderate coronary artery calcifications. No filling defects in the pulmonary arteries to suggestpulmonary emboli. Mediastinum/Nodes: No mediastinal, hilar, or axillary adenopathy. Lungs/Pleura: Lungs are clear. No focal airspace opacities or suspicious nodules. No effusions. Upper Abdomen: Imaging into the upper abdomen shows no acute findings. Punctate calcifications in the upper pole of the left kidney. Musculoskeletal: Chest wall soft tissues are unremarkable. No acute bony abnormality. Review of the MIP images confirms the above findings. IMPRESSION: Mild cardiomegaly.  Coronary artery disease. Negative for pulmonary embolus.  No acute cardiopulmonary disease. Electronically Signed   By: Rolm Baptise M.D.   On: 12/18/2018 19:14   Dg Chest Port 1 View  Result Date: 12/18/2018 CLINICAL DATA:  Short of breath and chest pain EXAM: PORTABLE CHEST 1 VIEW COMPARISON:  12/01/2018 FINDINGS: Cardiac enlargement without heart failure. Chronic lung disease with prominent lung markings. No acute infiltrate or effusion. Negative for mass lesion. IMPRESSION: Prominent lung markings appear chronic. No superimposed acute abnormality. Electronically Signed   By: Franchot Gallo M.D.   On: 12/18/2018 17:37    Procedures Procedures (including critical care time)  Medications Ordered in ED Medications  aspirin EC tablet 81 mg (has no administration in time range)  midodrine (PROAMATINE) tablet 5 mg (has no administration in time range)  rosuvastatin (CRESTOR) tablet 40 mg (has no administration in time range)  FLUoxetine (PROZAC) capsule 20 mg (has no administration in time range)  insulin detemir (LEVEMIR) injection 15 Units (has no administration in time range)  pantoprazole (PROTONIX) EC tablet 40 mg (has no administration in time range)  ticagrelor (BRILINTA) tablet 90 mg (has no administration in time range)  vitamin B-12 (CYANOCOBALAMIN) tablet 1,000 mcg (has no administration in time range)  gabapentin (NEURONTIN) capsule 100 mg (has no administration in time range)  Vitamin D CAPS 5,000 Units (has no administration in time range)  multivitamin with minerals tablet 1 tablet (has no administration in time range)  albuterol (VENTOLIN HFA) 108 (90 Base) MCG/ACT inhaler  2 puff (has no administration in time range)  latanoprost (XALATAN) 0.005 % ophthalmic solution 1 drop (has no administration in time range)  dorzolamide (TRUSOPT) 2 % ophthalmic solution 1 drop (has no administration in time range)  acetaminophen (TYLENOL) tablet 650 mg (has no administration in time range)  ondansetron (ZOFRAN) injection 4 mg (has no  administration in time range)  heparin injection 5,000 Units (has no administration in time range)  insulin aspart (novoLOG) injection 0-9 Units (has no administration in time range)  iohexol (OMNIPAQUE) 350 MG/ML injection 63 mL (63 mLs Intravenous Contrast Given 12/18/18 1855)     Initial Impression / Assessment and Plan / ED Course  I have reviewed the triage vital signs and the nursing notes.  Pertinent labs & imaging results that were available during my care of the patient were reviewed by me and considered in my medical decision making (see chart for details).  Clinical Course as of Dec 18 2114  Tue Dec 18, 2018  1803 Discussed case with on-call nephrology who agrees with CT PE study with contrast.   [BM]  2021 Discussed with on-call cardiology MD who advises admission for cardiac rule out.   [BM]  2032 Discussed with hospitalist.   [BM]    Clinical Course User Index [BM] Nuala Alpha A, PA-C   Initial troponin negative CBC with anemia of 9.9 appears baseline BMP with creatinine of 2.49 baseline for dialysis patient D-dimer 2.74, CTA ordered, nephrology was consulted as patient recently began hemodialysis, they agreed with plan for CT PE study with contrast. Chest x-ray: IMPRESSION: Prominent lung markings appear chronic. No superimposed acute abnormality.  EKG without acute findings reviewed with Dr. Rex Kras - CTA:  IMPRESSION:  Mild cardiomegaly. Coronary artery disease.    Negative for pulmonary embolus.    No acute cardiopulmonary disease.  --------------- Discussed case with on-call cardiology who advises medicine admission for cardiac rule out.  On reevaluation patient resting comfortably in no acute distress, she states understanding for admission and is agreeable to care plan.  States that she is feeling well and denies any shortness of breath or chest tightness on reevaluation.  Vital signs within normal limits. - Case discussed with hospitalist,  patient admitted to hospitalist service for further management.  Patient was seen and evaluated by Dr. Rex Kras during this visit.   Note: Portions of this report may have been transcribed using voice recognition software. Every effort was made to ensure accuracy; however, inadvertent computerized transcription errors may still be present. Final Clinical Impressions(s) / ED Diagnoses   Final diagnoses:  Shortness of breath  Chest pain, unspecified type    ED Discharge Orders    None       Gari Crown 12/18/18 2122    Little, Wenda Overland, MD 12/23/18 1047

## 2018-12-18 NOTE — H&P (Signed)
.  History and Physical    Priscilla Davis ELF:[id number] DOB: 1951-08-06 DOA: 12/18/2018  PCP: Kristen Loader, FNP Patient coming from: Home   Chief Complaint: Chest Pain  HPI: Priscilla Davis is a 68 y.o. female with medical history significant of end-stage renal disease with hemodialysis through left upper extremity AV graft Tuesday Thursday Saturday, congestive heart failure with reduced ejection fraction secondary to ischemic cardiomyopathy, diabetes mellitus and recent PCI on April 6 showing high-grade distal stenosis of RCA presented to the emergency department today reporting sudden onset shortness of breath associated with chest pain.  Specifically the patient reports that she attended her dialysis session today which was aborted secondary to hypotension with systolic blood pressure in the low 80s and subjective malaise which resolved with small crystalloid bolus.  The patient reports she was driven home and while trying to ascend a flight of stairs had a sudden onset left-sided chest tightness of constant character which radiated to her central chest moderately severe associated with shortness of breath and no diaphoresis nausea or impending doom.  The patient reports this resolved after stopping going up the stairs.  The patient denies any associated fever cough dependent edema orthopnea or proximal nocturnal dyspnea.  Additionally she denies any recent sick contacts or travel.  ED Course: In the emergency department the patient underwent investigation for her shortness of breath was noted to be saturating well on room air underwent CT pulmonary angiogram without any evidence of PE or intrathoracic pathology.  The hospital service was consulted for admission for cardiac rule out after the case was discussed between the ED events practice provider and cardiology as per signout.  Past Medical Records Reviewed and Summarized: Patient discharge 12/12/2018 following admission with chest pain subsequently  underwent left heart cardiac cath on on April 6 showing high-grade distal stenosis of RCA requiring PCI.  Aspirin and Brilinta for 12 months along with high-dose statins.  Of note her clinical course was complicated by congestive heart failure with reduced ejection fraction intolerant to beta-blockers given her hypotension secondary to end-stage renal disease   Review of Systems:  Constitutional: Denies Weight Loss, Fever, Chills or Night Sweats Respiratory: Reports Shortness of Breath Denies Cough, Hemoptysis, Wheezing, Pleurisy Cardiovascular: Reports Chest Pain Denies  Paroxsymal Nocturnal Dyspnea, Palpitations, Edema Gastrointestinal: Denies Nausea, Vomiting, Diarrhea, Hematemesis, Melena Endocrine: Denies Excess Thirst, Polyuria, Cold Intolerance, Heat Intolerance All other systems were reviewed and are negative     Past Medical History:  Diagnosis Date  . Arthritis   . Asthma   . Depression   . Diabetes mellitus without complication ([place])    Type II  . GERD (gastroesophageal reflux disease)   . Hypertension   . Renal disorder    CKD 5    Past Surgical History:  Procedure Laterality Date  . AV FISTULA PLACEMENT Left 06/19/2018   Procedure: INSERTION OF 4-7MM X 45CM ARTERIOVENOUS (AV) GORE-TEX GRAFT LEFT  ARM;  Surgeon: Elam Dutch, MD;  Location: [facility];  Service: Vascular;  Laterality: Left;  . CESAREAN SECTION    . CHOLECYSTECTOMY    . COLONOSCOPY    . CORONARY STENT INTERVENTION N/A 12/03/2018   Procedure: CORONARY STENT INTERVENTION;  Surgeon: Lorretta Harp, MD;  Location: [facility];  Service: Cardiovascular;  Laterality: N/A;  . EYE SURGERY Bilateral    diabetic   . LEFT HEART CATH AND CORONARY ANGIOGRAPHY N/A 12/03/2018   Procedure: LEFT HEART CATH AND CORONARY ANGIOGRAPHY;  Surgeon: Lorretta Harp, MD;  Location: [facility];  Service: Cardiovascular;  Laterality: N/A;  . toe amputation Right    5th toe     reports that she quit smoking  about 3 months ago. Her smoking use included cigarettes. She has a 7.50 pack-year smoking history. She has never used smokeless tobacco. She reports current alcohol use. She reports that she does not use drugs.  No Known Allergies  Family History  Problem Relation Age of Onset  . Peripheral vascular disease Mother   . Hypertension Mother      Prior to Admission medications   Medication Sig Start Date End Date Taking? Authorizing Provider  acetaminophen (TYLENOL) 500 MG tablet Take 1,000 mg by mouth every 6 (six) hours as needed for fever or pain.     [provider]  albuterol (PROVENTIL HFA;VENTOLIN HFA) 108 (90 Base) MCG/ACT inhaler Inhale 2 puffs into the lungs every 6 (six) hours as needed for wheezing or shortness of breath.    [provider]  aspirin EC 81 MG tablet Take 81 mg by mouth daily.    [provider]  Cholecalciferol (VITAMIN D) 125 MCG (5000 UT) CAPS Take 5,000 Units by mouth daily.     [provider]  dorzolamide (TRUSOPT) 2 % ophthalmic solution Place 1 drop into both eyes 2 (two) times daily.    [provider]  FLUoxetine (PROZAC) 20 MG capsule Take 20 mg by mouth daily. 05/22/17   [provider]  gabapentin (NEURONTIN) 100 MG capsule Take 1 capsule (100 mg total) by mouth daily. 12/09/18   Guilford Shi, MD  insulin detemir (LEVEMIR) 100 UNIT/ML injection Inject 0.15 mLs (15 Units total) into the skin daily. Patient taking differently: Inject 15 Units into the skin at bedtime.  09/12/18   Ghimire, Henreitta Leber, MD  latanoprost (XALATAN) 0.005 % ophthalmic solution Place 1 drop into both eyes at bedtime.    [provider]  lidocaine-prilocaine (EMLA) cream Apply 1 application topically Every Tuesday,Thursday,and Saturday with dialysis. 11/14/18   [provider]  liraglutide (VICTOZA) 18 MG/3ML SOPN Inject 1.8 mg into the skin every evening.     [provider]  midodrine (PROAMATINE) 5 MG  tablet Take 1 tablet (5 mg total) by mouth 2 (two) times daily with a meal for 30 days. 12/12/18 01/11/19  Domenic Polite, MD  Multiple Vitamin (MULTIVITAMIN WITH MINERALS) TABS tablet Take 1 tablet by mouth daily. woman's    [provider]  omeprazole (PRILOSEC) 20 MG capsule Take 40 mg by mouth daily.     [provider]  rosuvastatin (CRESTOR) 40 MG tablet Take 40 mg by mouth at bedtime.     [provider]  ticagrelor (BRILINTA) 90 MG TABS tablet Take 1 tablet (90 mg total) by mouth 2 (two) times daily. 12/12/18   Domenic Polite, MD  vitamin B-12 (CYANOCOBALAMIN) 1000 MCG tablet Take 1,000 mcg by mouth daily.    [provider]    Physical Exam: Vitals:   12/18/18 1646 12/18/18 1700 12/18/18 1831 12/18/18 1833  BP: 129/60 120/60 118/62 (!) 145/66  Pulse: 81 81 88 75  Resp: 15 12 16 19   Temp: 98.3 F (36.8 C)     TempSrc: Oral     SpO2: 100% 99% 100% 100%  Weight: 87.5 kg     Height: 5\' 9"  (1.753 m)       Constitutional:Vital Signs as per [facility] than three noted] No Acute Distress Eyes: Pink Conjunctiva and no PtosisPupils Equal  and Reactive to light and accommodation ENMT:  External Appearance of Ears and Nose without obvious deformity, masses or scarsExamination of teeth lips and gums: poor dentition Neck:  Trachea Midline, Neck Symmetric Thyroid without tenderness, palpable masses or nodules Respiratory: Respiratory Effort Normal: No Use of Respiratory Muscles,No  Intercostal Retractions Lungs Clear to Auscultation Bilaterally Cardiovascular:  Heart Auscultated: Regular Regular without any added sounds or murmurs No Lower Extremity Edema Gastrointestinal: Abdomen soft and nontender without palpable masses, guarding or rebound  No Palpable Splenomegaly or Hepatomegaly Lymphatic: No Palpable Cervical Lymphadenopathy or Palpable No Axillary Lymphadenopathy Neurologic: B/L UE 5/5 Power with Sensation Intact Psychiatric: Patient Orientated  to Time, Place and Person Patient with appropriate mood and affect Recent and Remote Memory Intact LUE Graft Thrill and Bruit Present   Labs on Admission: I have personally reviewed following labs and imaging studies  CBC: Recent Labs  Lab 12/18/18 1722  WBC 9.5  NEUTROABS 6.6  HGB 9.9*  HCT 31.8*  MCV 93.5  PLT 161   Basic Metabolic Panel: Recent Labs  Lab 12/18/18 1722  NA 135  K 3.2*  CL 97*  CO2 25  GLUCOSE 209*  BUN 11  CREATININE 2.49*  CALCIUM 8.5*   GFR: Estimated Creatinine Clearance: 25.1 mL/min (A) (by C-G formula based on SCr of 2.49 mg/dL (H)). Liver Function Tests: No results for input(s): AST, ALT, ALKPHOS, BILITOT, PROT, ALBUMIN in the last 168 hours. No results for input(s): LIPASE, AMYLASE in the last 168 hours. No results for input(s): AMMONIA in the last 168 hours. Coagulation Profile: No results for input(s): INR, PROTIME in the last 168 hours. Cardiac Enzymes: Recent Labs  Lab 12/18/18 1722  TROPONINI <0.03   BNP (last 3 results) No results for input(s): PROBNP in the last 8760 hours. HbA1C: No results for input(s): HGBA1C in the last 72 hours. CBG: Recent Labs  Lab 12/12/18 0628 12/12/18 1144  GLUCAP 146* 163*   Lipid Profile: No results for input(s): CHOL, HDL, LDLCALC, TRIG, CHOLHDL, LDLDIRECT in the last 72 hours. Thyroid Function Tests: No results for input(s): TSH, T4TOTAL, FREET4, T3FREE, THYROIDAB in the last 72 hours. Anemia Panel: No results for input(s): VITAMINB12, FOLATE, FERRITIN, TIBC, IRON, RETICCTPCT in the last 72 hours. Urine analysis:    Component Value Date/Time   COLORURINE YELLOW 07/26/2017 1000   APPEARANCEUR HAZY (A) 07/26/2017 1000   LABSPEC 1.015 07/26/2017 1000   PHURINE 6.0 07/26/2017 1000   GLUCOSEU NEGATIVE 07/26/2017 1000   HGBUR NEGATIVE 07/26/2017 1000   BILIRUBINUR NEGATIVE 07/26/2017 1000   KETONESUR NEGATIVE 07/26/2017 1000   PROTEINUR 100 (A) 07/26/2017 1000   NITRITE NEGATIVE  07/26/2017 1000   LEUKOCYTESUR TRACE (A) 07/26/2017 1000    Radiological Exams on Admission: Ct Angio Chest Pe W And/or Wo Contrast  Result Date: 12/18/2018 CLINICAL DATA:  Shortness of breath EXAM: CT ANGIOGRAPHY CHEST WITH CONTRAST TECHNIQUE: Multidetector CT imaging of the chest was performed using the standard protocol during bolus administration of intravenous contrast. Multiplanar CT image reconstructions and MIPs were obtained to evaluate the vascular anatomy. CONTRAST:  2mL OMNIPAQUE IOHEXOL 350 MG/ML SOLN COMPARISON:  Chest x-ray today FINDINGS: Cardiovascular: Heart is mildly enlarged. Aorta is normal caliber. Moderate coronary artery calcifications. No filling defects in the pulmonary arteries to suggestpulmonary emboli. Mediastinum/Nodes: No mediastinal, hilar, or axillary adenopathy. Lungs/Pleura: Lungs are clear. No focal airspace opacities or suspicious nodules. No effusions. Upper Abdomen: Imaging into the upper abdomen shows no acute findings. Punctate calcifications in the upper pole of  the left kidney. Musculoskeletal: Chest wall soft tissues are unremarkable. No acute bony abnormality. Review of the MIP images confirms the above findings. IMPRESSION: Mild cardiomegaly.  Coronary artery disease. Negative for pulmonary embolus. No acute cardiopulmonary disease. Electronically Signed   By: Rolm Baptise M.D.   On: 12/18/2018 19:14   Dg Chest Port 1 View  Result Date: 12/18/2018 CLINICAL DATA:  Short of breath and chest pain EXAM: PORTABLE CHEST 1 VIEW COMPARISON:  12/01/2018 FINDINGS: Cardiac enlargement without heart failure. Chronic lung disease with prominent lung markings. No acute infiltrate or effusion. Negative for mass lesion. IMPRESSION: Prominent lung markings appear chronic. No superimposed acute abnormality. Electronically Signed   By: Franchot Gallo M.D.   On: 12/18/2018 17:37    ECG Independent Review and Interpretation: Sinus rythym Rate 82 QRS 108 QTC 563 Flatening of T  Waves but no new ST Changes from 12/04/2018 ECG  Assessment/Plan Active Problems:   Chest pain  Chest pain: To rule out acute coronary syndrome Exertional Shortness of Breath and Chest Tightness in setting of recent PCI to RCA CT Pulmonary Angiogram negative for PE or Infiltrate Initial Troponin I  Undetectable 6:19 PM ECG: Flatening of T Waves but no new ST Changes from 12/04/2018 ECG * Of note patient recently initiated ticagrelor which can cause subjective SOB Plan: Continue ASA 81, Ticagrelor 90mg  , Cardiology Consultation, Serial Trop-I and if becomes + for Heparin, AM ECG  Congestive heart failure with reduced ejection fraction, Chronic not in exacerbation Secondary to cardiomyopathy last ejection fraction 45% Currently on room air and appears euvolemic despite fluid bolus dialysis earlier today Not on BB Due to Hypotension Plan: Follow volume Status, Low Na Diet  End-stage renal disease on HD initiated HD on November 15, 2018 Patient dialyzed via left upper extremity AV graft Tuesday Thursday Saturday last dialysis session today aborted due to hypotension Plan: Nephrology Consult for HD and Further Recommendations as per Nephrology  Hyperlipidemia Continue Rosuvastatin 40mg   Diabetes mellitus Glucose 200 at presentation 15 Units Detemir with Victoza at Home Plan: Continue Levemir 15 Units with Sliding Scale Insulin as Inpatient   Anemia Hb 9.9, Chronic likely Renal Disease and Chronic Disease Plan: Follow and transfuse PRN to keep Hb > 7mg /dl  Orthostatic hypotension Continue Midodrine 5mg  BID  Hypokalemia ESRD , will replace cautiosuly Patient 3.2 Plan: PO KCL 10 meq and follow AM Labs  Glaucoma Continue Home Eye Drops  Chronic Pain Continue Fluoxetine 20mg  , Gabapentin 100mg  Daily  DVT prophylaxis: Padua Score 4 will use Sub Q Heparin Prophalaxis Code Status: Full Code as per Patient Family Communication: Nicholes Mango Attempted sister [phone] no answer  Disposition Plan: Home Consults called: ED Called Cardiology, will need Nephrology by Day team Admission status: Observation Tele   Chriss Driver Emilo Gras MD [facility] Pager [phone]  If 7PM-7AM, please contact night-coverage [email or web address] Password [facility]  12/18/2018, 9:06 PM

## 2018-12-18 NOTE — ED Notes (Signed)
Patient transported to CT 

## 2018-12-19 ENCOUNTER — Encounter (HOSPITAL_COMMUNITY): Payer: Self-pay

## 2018-12-19 ENCOUNTER — Other Ambulatory Visit: Payer: Self-pay

## 2018-12-19 DIAGNOSIS — R0602 Shortness of breath: Secondary | ICD-10-CM | POA: Diagnosis not present

## 2018-12-19 DIAGNOSIS — R079 Chest pain, unspecified: Secondary | ICD-10-CM | POA: Diagnosis not present

## 2018-12-19 DIAGNOSIS — I132 Hypertensive heart and chronic kidney disease with heart failure and with stage 5 chronic kidney disease, or end stage renal disease: Secondary | ICD-10-CM | POA: Diagnosis not present

## 2018-12-19 DIAGNOSIS — N186 End stage renal disease: Secondary | ICD-10-CM | POA: Diagnosis not present

## 2018-12-19 LAB — TSH: TSH: 1.812 u[IU]/mL (ref 0.350–4.500)

## 2018-12-19 LAB — CBC
HCT: 28.9 % — ABNORMAL LOW (ref 36.0–46.0)
Hemoglobin: 9.1 g/dL — ABNORMAL LOW (ref 12.0–15.0)
MCH: 28.8 pg (ref 26.0–34.0)
MCHC: 31.5 g/dL (ref 30.0–36.0)
MCV: 91.5 fL (ref 80.0–100.0)
Platelets: 352 10*3/uL (ref 150–400)
RBC: 3.16 MIL/uL — ABNORMAL LOW (ref 3.87–5.11)
RDW: 16.1 % — ABNORMAL HIGH (ref 11.5–15.5)
WBC: 10 10*3/uL (ref 4.0–10.5)
nRBC: 0.2 % (ref 0.0–0.2)

## 2018-12-19 LAB — BASIC METABOLIC PANEL
Anion gap: 14 (ref 5–15)
BUN: 15 mg/dL (ref 8–23)
CO2: 25 mmol/L (ref 22–32)
Calcium: 8.6 mg/dL — ABNORMAL LOW (ref 8.9–10.3)
Chloride: 97 mmol/L — ABNORMAL LOW (ref 98–111)
Creatinine, Ser: 3.3 mg/dL — ABNORMAL HIGH (ref 0.44–1.00)
GFR calc Af Amer: 16 mL/min — ABNORMAL LOW (ref >60)
GFR calc non Af Amer: 14 mL/min — ABNORMAL LOW (ref >60)
Glucose, Bld: 268 mg/dL — ABNORMAL HIGH (ref 70–99)
Potassium: 3.3 mmol/L — ABNORMAL LOW (ref 3.5–5.1)
Sodium: 136 mmol/L (ref 135–145)

## 2018-12-19 LAB — GLUCOSE, CAPILLARY
Glucose-Capillary: 182 mg/dL — ABNORMAL HIGH (ref 70–99)
Glucose-Capillary: 173 mg/dL — ABNORMAL HIGH (ref 70–99)

## 2018-12-19 LAB — HEMOGLOBIN A1C
Hgb A1c MFr Bld: 7.2 % — ABNORMAL HIGH (ref 4.8–5.6)
Mean Plasma Glucose: 159.94 mg/dL

## 2018-12-19 LAB — TROPONIN I
Troponin I: <0.03 ng/mL (ref <0.03)
Troponin I: <0.03 ng/mL (ref <0.03)

## 2018-12-19 MED ORDER — CHLORHEXIDINE GLUCONATE CLOTH 2 % EX PADS: 6.0000 | MEDICATED_PAD | Freq: Every day | CUTANEOUS | Status: DC

## 2018-12-19 MED ORDER — POTASSIUM CHLORIDE 20 MEQ PO PACK
20.0000 meq | PACK | Freq: Once | ORAL | Status: DC
  Filled 2018-12-19: qty 1

## 2018-12-19 MED ORDER — RENA-VITE PO TABS: 1.0000 | ORAL_TABLET | Freq: Every day | ORAL | Status: DC

## 2018-12-19 NOTE — Progress Notes (Signed)
Pt has orders to be discharged. Discharge instructions given and pt has no additional questions at this time. Medication regimen reviewed and pt educated. Pt verbalized understanding and has no additional questions. Telemetry box removed. IV removed and site in good condition. Pt stable and waiting for transportation. 

## 2018-12-19 NOTE — Discharge Summary (Addendum)
Physician Discharge Summary  Priscilla Davis KCL:[id number] DOB: 1950-12-08 DOA: 12/18/2018  PCP: Kristen Loader, FNP  Admit date: 12/18/2018 Discharge date: 12/19/2018  Admitted From: home Discharge disposition: home   Recommendations for Outpatient Follow-Up:   1. Consider DG esophagus to r/o GI component of chest tightness (was due to have EGD and colonoscopy per patient recently-- being on brilenta for 12 months has complicated getting these procedures as well) 2. PFTs 3. Referral to counseling for anxiety   Discharge Diagnosis:   Active Problems:   Chest pain   Shortness of breath    Discharge Condition: Improved.  Diet recommendation: renal/carb mod  Wound care: None.  Code status: Full.   History of Present Illness:   Priscilla Davis is a 68 y.o. female with medical history significant of end-stage renal disease with hemodialysis through left upper extremity AV graft Tuesday Thursday Saturday, congestive heart failure with reduced ejection fraction secondary to ischemic cardiomyopathy, diabetes mellitus and recent PCI on April 6 showing high-grade distal stenosis of RCA presented to the emergency department today reporting sudden onset shortness of breath associated with chest pain.  Specifically the patient reports that she attended her dialysis session today which was aborted secondary to hypotension with systolic blood pressure in the low 80s and subjective malaise which resolved with small crystalloid bolus.  The patient reports she was driven home and while trying to ascend a flight of stairs had a sudden onset left-sided chest tightness of constant character which radiated to her central chest moderately severe associated with shortness of breath and no diaphoresis nausea or impending doom.  The patient reports this resolved after stopping going up the stairs.  The patient denies any associated fever cough dependent edema orthopnea or proximal nocturnal dyspnea.   Additionally she denies any recent sick contacts or travel.   Hospital Course by Problem:   Exertional Shortness of Breath and Chest Tightness in setting of recent PCI to RCA CT Pulmonary Angiogram negative for PE or Infiltrate -tightness is not positional, not related to eating (although it did occur today while she was eating), no related to movement, no pain with palpation CE negative EKG:  no new ST Changes from 12/04/2018 ECG * Of note patient recently initiated ticagrelor which can cause subjective SOB but upon further discussion she was having these symptoms prior to cath as well I discussed case with patient's cardiologist who is in agreement that EKG/CE are re-assuring  Congestive heart failure with reduced ejection fraction, Chronic not in exacerbation Secondary to cardiomyopathy last ejection fraction 45% Currently on room air and appears euvolemic despite fluid bolus dialysis earlier today Not on BB Due to Hypotension  End-stage renal disease on HD initiated HD on November 15, 2018 Patient dialyzed via left upper extremity AV graft Tuesday Thursday Saturday last dialysis session today aborted due to hypotension  Hyperlipidemia Continue Rosuvastatin 40mg   Diabetes mellitus Glucose 200 at presentation 15 Units Detemir with Victoza at Home  Anemia Hb 9.9, Chronic likely Renal Disease and Chronic Disease  Orthostatic hypotension Continue Midodrine 5mg  BID  Hypokalemia ESRD- replace with HD  Glaucoma Continue Home Eye Drops  Chronic Pain Continue Fluoxetine 20mg  , Gabapentin 100mg  Daily  Anxiety -patient has had several health issues lately also in the midst of trying to move back to [place] to be with family    Medical Consultants:      Discharge Exam:   Vitals:   12/19/18 1004 12/19/18 1145  BP: (!) 146/75 136/61  Pulse: 78 80  Resp:  18  Temp:  98.6 F (37 C)  SpO2: 100% 100%   Vitals:   12/18/18 2154 12/19/18 0530 12/19/18 1004  12/19/18 1145  BP: (!) 147/67 (!) 117/56 (!) 146/75 136/61  Pulse: 86 76 78 80  Resp: 18 18  18   Temp: 98.6 F (37 C) 98.5 F (36.9 C)  98.6 F (37 C)  TempSrc: Oral Oral  Oral  SpO2: 100%  100% 100%  Weight: 90.1 kg 88.9 kg    Height: 5\' 8"  (1.727 m)       General exam: anxious appearing   The results of significant diagnostics from this hospitalization (including imaging, microbiology, ancillary and laboratory) are listed below for reference.     Procedures and Diagnostic Studies:   Ct Angio Chest Pe W And/or Wo Contrast  Result Date: 12/18/2018 CLINICAL DATA:  Shortness of breath EXAM: CT ANGIOGRAPHY CHEST WITH CONTRAST TECHNIQUE: Multidetector CT imaging of the chest was performed using the standard protocol during bolus administration of intravenous contrast. Multiplanar CT image reconstructions and MIPs were obtained to evaluate the vascular anatomy. CONTRAST:  67mL OMNIPAQUE IOHEXOL 350 MG/ML SOLN COMPARISON:  Chest x-ray today FINDINGS: Cardiovascular: Heart is mildly enlarged. Aorta is normal caliber. Moderate coronary artery calcifications. No filling defects in the pulmonary arteries to suggestpulmonary emboli. Mediastinum/Nodes: No mediastinal, hilar, or axillary adenopathy. Lungs/Pleura: Lungs are clear. No focal airspace opacities or suspicious nodules. No effusions. Upper Abdomen: Imaging into the upper abdomen shows no acute findings. Punctate calcifications in the upper pole of the left kidney. Musculoskeletal: Chest wall soft tissues are unremarkable. No acute bony abnormality. Review of the MIP images confirms the above findings. IMPRESSION: Mild cardiomegaly.  Coronary artery disease. Negative for pulmonary embolus. No acute cardiopulmonary disease. Electronically Signed   By: Rolm Baptise M.D.   On: 12/18/2018 19:14   Dg Chest Port 1 View  Result Date: 12/18/2018 CLINICAL DATA:  Short of breath and chest pain EXAM: PORTABLE CHEST 1 VIEW COMPARISON:  12/01/2018  FINDINGS: Cardiac enlargement without heart failure. Chronic lung disease with prominent lung markings. No acute infiltrate or effusion. Negative for mass lesion. IMPRESSION: Prominent lung markings appear chronic. No superimposed acute abnormality. Electronically Signed   By: Franchot Gallo M.D.   On: 12/18/2018 17:37     Labs:   Basic Metabolic Panel: Recent Labs  Lab 12/18/18 1722 12/19/18 0411  NA 135 136  K 3.2* 3.3*  CL 97* 97*  CO2 25 25  GLUCOSE 209* 268*  BUN 11 15  CREATININE 2.49* 3.30*  CALCIUM 8.5* 8.6*   GFR Estimated Creatinine Clearance: 18.8 mL/min (A) (by C-G formula based on SCr of 3.3 mg/dL (H)). Liver Function Tests: No results for input(s): AST, ALT, ALKPHOS, BILITOT, PROT, ALBUMIN in the last 168 hours. No results for input(s): LIPASE, AMYLASE in the last 168 hours. No results for input(s): AMMONIA in the last 168 hours. Coagulation profile No results for input(s): INR, PROTIME in the last 168 hours.  CBC: Recent Labs  Lab 12/18/18 1722 12/19/18 0411  WBC 9.5 10.0  NEUTROABS 6.6  --   HGB 9.9* 9.1*  HCT 31.8* 28.9*  MCV 93.5 91.5  PLT 368 352   Cardiac Enzymes: Recent Labs  Lab 12/18/18 1722 12/18/18 2205 12/19/18 0411 12/19/18 0852  TROPONINI <0.03 <0.03 <0.03 <0.03   BNP: Invalid input(s): POCBNP CBG: Recent Labs  Lab 12/18/18 2158 12/19/18 0612 12/19/18 1124  GLUCAP 159* 182* 173*   D-Dimer Recent Labs  12/18/18 1722  DDIMER 2.74*   Hgb A1c Recent Labs    12/19/18 0411  HGBA1C 7.2*   Lipid Profile No results for input(s): CHOL, HDL, LDLCALC, TRIG, CHOLHDL, LDLDIRECT in the last 72 hours. Thyroid function studies Recent Labs    12/19/18 0924  TSH 1.812   Anemia work up No results for input(s): VITAMINB12, FOLATE, FERRITIN, TIBC, IRON, RETICCTPCT in the last 72 hours. Microbiology No results found for this or any previous visit (from the past 240 hour(s)).   Discharge Instructions:   Discharge  Instructions    Discharge instructions   Complete by:  As directed    Renal/carb mod diet   Increase activity slowly   Complete by:  As directed      Allergies as of 12/19/2018   No Known Allergies     Medication List    TAKE these medications   acetaminophen 500 MG tablet Commonly known as:  TYLENOL Take 1,000 mg by mouth every 6 (six) hours as needed for fever or pain.   albuterol 108 (90 Base) MCG/ACT inhaler Commonly known as:  VENTOLIN HFA Inhale 2 puffs into the lungs every 6 (six) hours as needed for wheezing or shortness of breath.   aspirin EC 81 MG tablet Take 81 mg by mouth daily.   dorzolamide 2 % ophthalmic solution Commonly known as:  TRUSOPT Place 1 drop into both eyes 2 (two) times daily.   FLUoxetine 20 MG capsule Commonly known as:  PROZAC Take 20 mg by mouth daily.   gabapentin 100 MG capsule Commonly known as:  NEURONTIN Take 1 capsule (100 mg total) by mouth daily.   insulin detemir 100 UNIT/ML injection Commonly known as:  LEVEMIR Inject 0.15 mLs (15 Units total) into the skin daily. What changed:  when to take this   latanoprost 0.005 % ophthalmic solution Commonly known as:  XALATAN Place 1 drop into both eyes at bedtime.   lidocaine-prilocaine cream Commonly known as:  EMLA Apply 1 application topically Every Tuesday,Thursday,and Saturday with dialysis.   midodrine 5 MG tablet Commonly known as:  PROAMATINE Take 1 tablet (5 mg total) by mouth 2 (two) times daily with a meal for 30 days.   multivitamin with minerals Tabs tablet Take 1 tablet by mouth daily. woman's   omeprazole 20 MG capsule Commonly known as:  PRILOSEC Take 20 mg by mouth daily.   rosuvastatin 40 MG tablet Commonly known as:  CRESTOR Take 40 mg by mouth at bedtime.   ticagrelor 90 MG Tabs tablet Commonly known as:  BRILINTA Take 1 tablet (90 mg total) by mouth 2 (two) times daily.   Victoza 18 MG/3ML Sopn Generic drug:  liraglutide Inject 1.8 mg into the  skin every evening.   vitamin B-12 1000 MCG tablet Commonly known as:  CYANOCOBALAMIN Take 1,000 mcg by mouth daily.   Vitamin D 125 MCG (5000 UT) Caps Take 5,000 Units by mouth daily.      Follow-up Information    Kristen Loader, FNP Follow up.   Specialty:  Family Medicine Why:  on Friday as previously scheduled Contact information: [place] [address] [phone]        Skeet Latch, MD Follow up.   Specialty:  Cardiology Why:  keep appointment Contact information: [address] [place] [address] [phone]            Time coordinating discharge: 25 min  Signed:  Geradine Girt DO  [facility] 12/19/2018,  1:05 PM

## 2018-12-19 NOTE — Consult Note (Addendum)
[facility] Renal Consultation Note    Indication for Consultation:  Management of ESRD/hemodialysis, anemia, hypertension/volume, and secondary hyperparathyroidism. PCP:  HPI: Priscilla Davis is a 68 y.o. female with a history of ESRD on dialysis. Dialyzes TTS at [facility].  PMH includes CHF, CAD, DM. Patient presented to the ED yesterday. Reported she had received most of her dialysis session, however did end the session early due to hypotension and was given a small fluid bolus. She did leave 1.2kg below her EDW despite shortened treatment. She reports she experienced some shortness of breath during her treatment and dizziness when her BP dropped. When she got home, she experienced sudden onset chest pain exacerbated by walking up the stairs. On presentation, patient was saturating well on room air. CTA showed no evidence of CT or intrathoracic pathology. Troponin was negative and ECG showed flattening of T waves but no new ST changes. K 3.2, Ca 8.5, Hgb 9.9, WBC 10.0. Patient was admitted for observation.   Patient was recently admitted 4/4/202-12/12/2018 with an NSTEMI and orthostatic hypotension. New RCA stent was placed. Lasix, amlodipine, and coreg with discontinued. Patient was started on midodrine BID. However, seems to be some confusion about BP meds. Patient reports she was not taking midodrine before dialysis because she thought she was told not to take it before dialysis. She is unsure if she was still taking her other blood pressure medications.  Today, patient denies SOB, dyspnea, CP and palpitations. Reports intermittent tight sensation of upper abdomen, not present during exam. Denies N/V/D, abdominal pain. Denies peripheral edema, fever, chills and sweats. She is concerned about persistent dyspnea on exertion.   Past Medical History:  Diagnosis Date  . Acute combined systolic (congestive) and diastolic (congestive) heart failure ([place]) 11/2018  . Arthritis   . Asthma   .  Depression   . Diabetes mellitus without complication ([place])    Type II  . GERD (gastroesophageal reflux disease)   . Hypertension   . Renal disorder    CKD 5   Past Surgical History:  Procedure Laterality Date  . AV FISTULA PLACEMENT Left 06/19/2018   Procedure: INSERTION OF 4-7MM X 45CM ARTERIOVENOUS (AV) GORE-TEX GRAFT LEFT  ARM;  Surgeon: Elam Dutch, MD;  Location: [facility];  Service: Vascular;  Laterality: Left;  . CESAREAN SECTION    . CHOLECYSTECTOMY    . COLONOSCOPY    . CORONARY STENT INTERVENTION N/A 12/03/2018   Procedure: CORONARY STENT INTERVENTION;  Surgeon: Lorretta Harp, MD;  Location: [facility];  Service: Cardiovascular;  Laterality: N/A;  . EYE SURGERY Bilateral    diabetic   . LEFT HEART CATH AND CORONARY ANGIOGRAPHY N/A 12/03/2018   Procedure: LEFT HEART CATH AND CORONARY ANGIOGRAPHY;  Surgeon: Lorretta Harp, MD;  Location: [facility];  Service: Cardiovascular;  Laterality: N/A;  . toe amputation Right    5th toe   Family History  Problem Relation Age of Onset  . Peripheral vascular disease Mother   . Hypertension Mother    Social History:  reports that she quit smoking about 3 months ago. Her smoking use included cigarettes. She has a 7.50 pack-year smoking history. She has never used smokeless tobacco. She reports current alcohol use. She reports that she does not use drugs.  ROS: As per HPI otherwise negative.  Physical Exam: Vitals:   12/18/18 1833 12/18/18 2154 12/19/18 0530 12/19/18 1004  BP: (!) 145/66 (!) 147/67 (!) 117/56 (!) 146/75  Pulse: 75 86  76 78  Resp: _0 Temp:  98.6 F (37 C) 98.5 F (36.9 C)   TempSrc:  Oral Oral   SpO2: 100% 100%  100%  Weight:  90.1 kg 88.9 kg   Height:  _1  (1.727 m)       General: Well developed, well nourished female, eating lunch, in NAD Head: Normocephalic, atraumatic, sclera non-icteric, mucus membranes are moist. Neck: JVD not elevated. Lungs: Clear bilaterally to  auscultation without wheezes, rales, or rhonchi. Breathing is unlabored. Heart: RRR with normal S1, S2. No murmurs, rubs, or gallops appreciated. Abdomen: Soft, non-tender, non-distended with normoactive bowel sounds. No rebound/guarding. No obvious abdominal masses. Musculoskeletal:  Strength and tone appear normal for age. Lower extremities: No edema or ischemic changes, no open wounds. Neuro: Alert and oriented X 3. Moves all extremities spontaneously. Psych:  Responds to questions appropriately with a normal affect. Dialysis Access: LUE AVG, + thrill  No Known Allergies Prior to Admission medications   Medication Sig Start Date End Date Taking? Authorizing Provider  acetaminophen (TYLENOL) 500 MG tablet Take 1,000 mg by mouth every 6 (six) hours as needed for fever or pain.     [provider]  albuterol (PROVENTIL HFA;VENTOLIN HFA) 108 (90 Base) MCG/ACT inhaler Inhale 2 puffs into the lungs every 6 (six) hours as needed for wheezing or shortness of breath.    [provider]  aspirin EC 81 MG tablet Take 81 mg by mouth daily.    [provider]  Cholecalciferol (VITAMIN D) 125 MCG (5000 UT) CAPS Take 5,000 Units by mouth daily.     [provider]  dorzolamide (TRUSOPT) 2 % ophthalmic solution Place 1 drop into both eyes 2 (two) times daily.    [provider]  FLUoxetine (PROZAC) 20 MG capsule Take 20 mg by mouth daily. 05/22/17   [provider]  gabapentin (NEURONTIN) 100 MG capsule Take 1 capsule (100 mg total) by mouth daily. 12/09/18   Guilford Shi, MD  insulin detemir (LEVEMIR) 100 UNIT/ML injection Inject 0.15 mLs (15 Units total) into the skin daily. Patient taking differently: Inject 15 Units into the skin at bedtime.  09/12/18   Ghimire, Henreitta Leber, MD  latanoprost (XALATAN) 0.005 % ophthalmic solution Place 1 drop into both eyes at bedtime.    [provider]  lidocaine-prilocaine (EMLA) cream Apply 1 application  topically Every Tuesday,Thursday,and Saturday with dialysis. 11/14/18   [provider]  liraglutide (VICTOZA) 18 MG/3ML SOPN Inject 1.8 mg into the skin every evening.     [provider]  midodrine (PROAMATINE) 5 MG tablet Take 1 tablet (5 mg total) by mouth 2 (two) times daily with a meal for 30 days. 12/12/18 01/11/19  Domenic Polite, MD  Multiple Vitamin (MULTIVITAMIN WITH MINERALS) TABS tablet Take 1 tablet by mouth daily. woman's    [provider]  omeprazole (PRILOSEC) 20 MG capsule Take 40 mg by mouth daily.     [provider]  rosuvastatin (CRESTOR) 40 MG tablet Take 40 mg by mouth at bedtime.     [provider]  ticagrelor (BRILINTA) 90 MG TABS tablet Take 1 tablet (90 mg total) by mouth 2 (two) times daily. 12/12/18   Domenic Polite, MD  vitamin B-12 (CYANOCOBALAMIN) 1000 MCG tablet Take 1,000 mcg by mouth daily.    [provider]   Current Facility-Administered Medications  Medication Dose Route Frequency Provider Last Rate Last Dose  . acetaminophen (TYLENOL) tablet 650 mg  650  mg Oral Q4H PRN Core, Chriss Driver, MD      . albuterol (PROVENTIL) (2.5 MG/3ML) 0.083% nebulizer solution 3 mL  3 mL Inhalation Q6H PRN Core, Chriss Driver, MD      . aspirin EC tablet 81 mg  81 mg Oral Daily Core, Chriss Driver, MD   81 mg at 12/19/18 1013  . cholecalciferol (VITAMIN D3) tablet 5,000 Units  5,000 Units Oral Daily Core, Chriss Driver, MD   5,000 Units at 12/19/18 1013  . dorzolamide (TRUSOPT) 2 % ophthalmic solution 1 drop  1 drop Both Eyes BID Core, Chriss Driver, MD   1 drop at 12/19/18 1025  . FLUoxetine (PROZAC) capsule 20 mg  20 mg Oral Daily Core, Chriss Driver, MD   20 mg at 12/19/18 1015  . gabapentin (NEURONTIN) capsule 100 mg  100 mg Oral Daily Core, Chriss Driver, MD   100 mg at 12/19/18 1013  . heparin injection 5,000 Units  5,000 Units Subcutaneous Q8H Core, Chriss Driver, MD   5,000 Units at 12/19/18 0618  . insulin aspart (novoLOG) injection 0-9 Units  0-9  Units Subcutaneous TID WC Core, Chriss Driver, MD   2 Units at 12/19/18 [phone]  . insulin detemir (LEVEMIR) injection 15 Units  15 Units Subcutaneous QHS Core, Chriss Driver, MD   15 Units at 12/18/18 2307  . latanoprost (XALATAN) 0.005 % ophthalmic solution 1 drop  1 drop Both Eyes QHS Core, Chriss Driver, MD   1 drop at 12/18/18 2303  . midodrine (PROAMATINE) tablet 5 mg  5 mg Oral BID WC Core, Chriss Driver, MD   5 mg at 12/19/18 1014  . multivitamin with minerals tablet 1 tablet  1 tablet Oral Daily Core, Chriss Driver, MD   1 tablet at 12/19/18 1015  . ondansetron (ZOFRAN) injection 4 mg  4 mg Intravenous Q6H PRN Core, Chriss Driver, MD      . pantoprazole (PROTONIX) EC tablet 40 mg  40 mg Oral Daily Core, Chriss Driver, MD   40 mg at 12/19/18 1024  . potassium chloride (KLOR-CON) packet 20 mEq  20 mEq Oral Once Core, Chriss Driver, MD      . rosuvastatin (CRESTOR) tablet 40 mg  40 mg Oral QHS Core, Chriss Driver, MD   40 mg at 12/18/18 2307  . ticagrelor (BRILINTA) tablet 90 mg  90 mg Oral BID Core, Chriss Driver, MD   90 mg at 12/19/18 1024  . vitamin B-12 (CYANOCOBALAMIN) tablet 1,000 mcg  1,000 mcg Oral Daily Core, Chriss Driver, MD   1,000 mcg at 12/19/18 1014   Labs: Basic Metabolic Panel: Recent Labs  Lab 12/18/18 1722 12/19/18 0411  NA 135 136  K 3.2* 3.3*  CL 97* 97*  CO2 25 25  GLUCOSE 209* 268*  BUN 11 15  CREATININE 2.49* 3.30*  CALCIUM 8.5* 8.6*   CBC: Recent Labs  Lab 12/18/18 1722 12/19/18 0411  WBC 9.5 10.0  NEUTROABS 6.6  --   HGB 9.9* 9.1*  HCT 31.8* 28.9*  MCV 93.5 91.5  PLT 368 352   Cardiac Enzymes: Recent Labs  Lab 12/18/18 1722 12/18/18 2205 12/19/18 0411 12/19/18 0852  TROPONINI <0.03 <0.03 <0.03 <0.03   CBG: Recent Labs  Lab 12/12/18 1144 12/18/18 2158 12/19/18 0612  GLUCAP 163* 159* 182*   Iron Studies: No results for input(s): IRON, TIBC, TRANSFERRIN, FERRITIN in the last 72 hours. Studies/Results: Ct Angio Chest Pe W And/or Wo Contrast  Result Date: 12/18/2018 CLINICAL DATA:   Shortness of breath  EXAM: CT ANGIOGRAPHY CHEST WITH CONTRAST TECHNIQUE: Multidetector CT imaging of the chest was performed using the standard protocol during bolus administration of intravenous contrast. Multiplanar CT image reconstructions and MIPs were obtained to evaluate the vascular anatomy. CONTRAST:  54m OMNIPAQUE IOHEXOL 350 MG/ML SOLN COMPARISON:  Chest x-ray today FINDINGS: Cardiovascular: Heart is mildly enlarged. Aorta is normal caliber. Moderate coronary artery calcifications. No filling defects in the pulmonary arteries to suggestpulmonary emboli. Mediastinum/Nodes: No mediastinal, hilar, or axillary adenopathy. Lungs/Pleura: Lungs are clear. No focal airspace opacities or suspicious nodules. No effusions. Upper Abdomen: Imaging into the upper abdomen shows no acute findings. Punctate calcifications in the upper pole of the left kidney. Musculoskeletal: Chest wall soft tissues are unremarkable. No acute bony abnormality. Review of the MIP images confirms the above findings. IMPRESSION: Mild cardiomegaly.  Coronary artery disease. Negative for pulmonary embolus. No acute cardiopulmonary disease. Electronically Signed   By: KRolm BaptiseM.D.   On: 12/18/2018 19:14   Dg Chest Port 1 View  Result Date: 12/18/2018 CLINICAL DATA:  Short of breath and chest pain EXAM: PORTABLE CHEST 1 VIEW COMPARISON:  12/01/2018 FINDINGS: Cardiac enlargement without heart failure. Chronic lung disease with prominent lung markings. No acute infiltrate or effusion. Negative for mass lesion. IMPRESSION: Prominent lung markings appear chronic. No superimposed acute abnormality. Electronically Signed   By: CFranchot GalloM.D.   On: 12/18/2018 17:37    Dialysis Orders: Center: [facility] on TTS. Time: 4 hours; 3k, 2.25 Ca; EDW 92.0Kg. 180NRe Optiflux BRF 800, DFR 800 Heparin 2000 unit bolus Mircera 150 mcg IV q 2 weeks (last given 12/13/2018) Venofer 50 mg IV q week (last given 12/18/2018)   Assessment/Plan: 1.  Chest  pain: In setting of recent NSTEMI with stend. CTA negative for PE or infiltrates, initial troponin negative and ECG without new ST changes. Management per primary. Cardiology consulted.  2. Orthostatic hypotension: Has had ongoing orthostatic hypotension, most recently occurring during dialysis yesterday. Lasix, coreg and amlodipine stopped during last admission and patient was started on midodrine, though she does not believe she was taking this before dialysis which likely contributed to IDH. BP currently stable. Resume midodrine.  3.  ESRD:  Dialyzes TTS, received 3:30h of treatment yesterday. No evidence of volume overload on exam, K+ 3.3. Will plan for dialysis on schedule tomorrow.  4.  Hypotension/volume: See above. Resume midodrine. Appears euvolemic. Will plan for HD tomorrow with UFG 1.0L. Was getting below outpatient EDW despite short treatment and hypotension, will likely need new EDW at discharge.  5.  Anemia: Hgb 9.1. Not due for ESA yet. Follow.  6.  Metabolic bone disease: Ca 8.6, corrected 8.9 based on last outpatient albumin (3.6 on 12/13/2018). Last phos and PTH within goal.  7.  Nutrition:  Last outpatient albumin 3.6. Will change multivitamin to renal vitamin.   SAnice Paganini PA-C 12/19/2018, 11:32 AM  [facility] Pager: [phone] I have seen and examined this patient and agree with plan and assessment in the above note with renal recommendations/intervention highlighted.  Pt is now being discharged.  I reviewed her medications and marked midodrine to take 2 pills before each HD session and follow BP as an outpatient.  JBroadus JohnA Brynna Dobos,MD 12/19/2018 3:24 PM

## 2018-12-19 NOTE — Care Management Obs Status (Signed)
[facility] NOTIFICATION   Patient Details  Name: Priscilla Davis MRN: [id number] Date of Birth: [birth year]   Medicare Observation Status Notification Given:  Yes    Carles Collet, RN 12/19/2018, 10:45 AM

## 2019-01-03 ENCOUNTER — Telehealth: Payer: Self-pay

## 2019-01-03 NOTE — Telephone Encounter (Signed)
Virtual Visit Pre-Appointment Phone Call [place] PHONE  "(Name), I am calling you today to discuss your upcoming appointment. We are currently trying to limit exposure to the virus that causes COVID-19 by seeing patients at home rather than in the office."  1. "What is the BEST phone number to call the day of the visit?" - include this in appointment notes  2. "Do you have or have access to (through a family member/friend) a smartphone with video capability that we can use for your visit?" a. If yes - list this number in appt notes as "cell" (if different from BEST phone #) and list the appointment type as a VIDEO visit in appointment notes b. If no - list the appointment type as a PHONE visit in appointment notes  3. Confirm consent - "In the setting of the current Covid19 crisis, you are scheduled for a (phone or video) visit with your provider on (date) at (time).  Just as we do with many in-office visits, in order for you to participate in this visit, we must obtain consent.  If you'd like, I can send this to your mychart (if signed up) or email for you to review.  Otherwise, I can obtain your verbal consent now.  All virtual visits are billed to your insurance company just like a normal visit would be.  By agreeing to a virtual visit, we'd like you to understand that the technology does not allow for your provider to perform an examination, and thus may limit your provider's ability to fully assess your condition. If your provider identifies any concerns that need to be evaluated in person, we will make arrangements to do so.  Finally, though the technology is pretty good, we cannot assure that it will always work on either your or our end, and in the setting of a video visit, we may have to convert it to a phone-only visit.  In either situation, we cannot ensure that we have a secure connection.  Are you willing to proceed?" STAFF: Did the patient verbally acknowledge consent to  telehealth visit? Document YES/NO here:   4. Advise patient to be prepared - "Two hours prior to your appointment, go ahead and check your blood pressure, pulse, oxygen saturation, and your weight (if you have the equipment to check those) and write them all down. When your visit starts, your provider will ask you for this information. If you have an Apple Watch or Kardia device, please plan to have heart rate information ready on the day of your appointment. Please have a pen and paper handy nearby the day of the visit as well."  5. Give patient instructions for MyChart download to smartphone OR Doximity/Doxy.me as below if video visit (depending on what platform provider is using)  6. Inform patient they will receive a phone call 15 minutes prior to their appointment time (may be from unknown caller ID) so they should be prepared to answer    TELEPHONE CALL NOTE  Priscilla Davis has been deemed a candidate for a follow-up tele-health visit to limit community exposure during the Covid-19 pandemic. I spoke with the patient via phone to ensure availability of phone/video source, confirm preferred email & phone number, and discuss instructions and expectations.  I reminded Priscilla Davis to be prepared with any vital sign and/or heart rhythm information that could potentially be obtained via home monitoring, at the time of her visit. I reminded Priscilla Davis to expect a phone call prior  to her visit.  Stella 01/03/2019 4:26 PM   INSTRUCTIONS FOR DOWNLOADING THE MYCHART APP TO SMARTPHONE  - The patient must first make sure to have activated MyChart and know their login information - If Apple, go to [employer] and type in MyChart in the search bar and download the app. If Android, ask patient to go to [employer] and type in [place] in the search bar and download the app. The app is free but as with any other app downloads, their phone may require them to verify saved payment information or  Apple/Android password.  - The patient will need to then log into the app with their MyChart username and password, and select [facility] as their healthcare provider to link the account. When it is time for your visit, go to the MyChart app, find appointments, and click Begin Video Visit. Be sure to Select Allow for your device to access the Microphone and Camera for your visit. You will then be connected, and your provider will be with you shortly.  **If they have any issues connecting, or need assistance please contact [facility] [phone])  **If using a computer, in order to ensure the best quality for their visit they will need to use either of the following Internet Browsers: [employer], or Google Chrome  IF USING DOXIMITY or DOXY.ME - The patient will receive a link just prior to their visit by text.     FULL LENGTH CONSENT FOR TELE-HEALTH VISIT   I hereby voluntarily request, consent and authorize [facility] and its employed or contracted physicians, physician assistants, nurse practitioners or other licensed health care professionals (the Practitioner), to provide me with telemedicine health care services (the "Services") as deemed necessary by the treating Practitioner. I acknowledge and consent to receive the Services by the Practitioner via telemedicine. I understand that the telemedicine visit will involve communicating with the Practitioner through live audiovisual communication technology and the disclosure of certain medical information by electronic transmission. I acknowledge that I have been given the opportunity to request an in-person assessment or other available alternative prior to the telemedicine visit and am voluntarily participating in the telemedicine visit.  I understand that I have the right to withhold or withdraw my consent to the use of telemedicine in the course of my care at any time, without affecting my right to future care or  treatment, and that the Practitioner or I may terminate the telemedicine visit at any time. I understand that I have the right to inspect all information obtained and/or recorded in the course of the telemedicine visit and may receive copies of available information for a reasonable fee.  I understand that some of the potential risks of receiving the Services via telemedicine include:  Marland Kitchen Delay or interruption in medical evaluation due to technological equipment failure or disruption; . Information transmitted may not be sufficient (e.g. poor resolution of images) to allow for appropriate medical decision making by the Practitioner; and/or  . In rare instances, security protocols could fail, causing a breach of personal health information.  Furthermore, I acknowledge that it is my responsibility to provide information about my medical history, conditions and care that is complete and accurate to the best of my ability. I acknowledge that Practitioner's advice, recommendations, and/or decision may be based on factors not within their control, such as incomplete or inaccurate data provided by me or distortions of diagnostic images or specimens that may result from electronic  transmissions. I understand that the practice of medicine is not an exact science and that Practitioner makes no warranties or guarantees regarding treatment outcomes. I acknowledge that I will receive a copy of this consent concurrently upon execution via email to the email address I last provided but may also request a printed copy by calling the office of [facility].    I understand that my insurance will be billed for this visit.   I have read or had this consent read to me. . I understand the contents of this consent, which adequately explains the benefits and risks of the Services being provided via telemedicine.  . I have been provided ample opportunity to ask questions regarding this consent and the Services and have had my  questions answered to my satisfaction. . I give my informed consent for the services to be provided through the use of telemedicine in my medical care  By participating in this telemedicine visit I agree to the above.

## 2019-01-08 ENCOUNTER — Telehealth: Payer: Self-pay | Admitting: Cardiovascular Disease

## 2019-01-08 NOTE — Telephone Encounter (Signed)
Smartphone/ my chart declined/ consent/ pre reg completed

## 2019-01-09 ENCOUNTER — Encounter: Payer: Self-pay | Admitting: Cardiovascular Disease

## 2019-01-09 ENCOUNTER — Telehealth (INDEPENDENT_AMBULATORY_CARE_PROVIDER_SITE_OTHER): Payer: Medicare Other | Admitting: Cardiovascular Disease

## 2019-01-09 DIAGNOSIS — N186 End stage renal disease: Secondary | ICD-10-CM | POA: Diagnosis not present

## 2019-01-09 DIAGNOSIS — I5041 Acute combined systolic (congestive) and diastolic (congestive) heart failure: Secondary | ICD-10-CM

## 2019-01-09 DIAGNOSIS — I214 Non-ST elevation (NSTEMI) myocardial infarction: Secondary | ICD-10-CM

## 2019-01-09 DIAGNOSIS — I2510 Atherosclerotic heart disease of native coronary artery without angina pectoris: Secondary | ICD-10-CM

## 2019-01-09 DIAGNOSIS — I10 Essential (primary) hypertension: Secondary | ICD-10-CM | POA: Diagnosis not present

## 2019-01-09 DIAGNOSIS — R0602 Shortness of breath: Secondary | ICD-10-CM

## 2019-01-09 DIAGNOSIS — E785 Hyperlipidemia, unspecified: Secondary | ICD-10-CM | POA: Diagnosis not present

## 2019-01-09 DIAGNOSIS — Z955 Presence of coronary angioplasty implant and graft: Secondary | ICD-10-CM

## 2019-01-09 NOTE — Progress Notes (Signed)
Virtual Visit via Video Note   This visit type was conducted due to national recommendations for restrictions regarding the COVID-19 Pandemic (e.g. social distancing) in an effort to limit this patient's exposure and mitigate transmission in our community.  Due to her co-morbid illnesses, this patient is at least at moderate risk for complications without adequate follow up.  This format is felt to be most appropriate for this patient at this time.  All issues noted in this document were discussed and addressed.  A limited physical exam was performed with this format.  Please refer to the patient's chart for her consent to telehealth for [facility].   Date:  01/15/2019   ID:  Priscilla Davis, DOB 1951/01/18, MRN [id number]  Patient Location: Home Provider Location: Office  PCP:  Kristen Loader, FNP  Cardiologist:  Skeet Latch, MD  Electrophysiologist:  None   Evaluation Performed:  Follow-Up Visit  Chief Complaint:  Shortness of breath  History of Present Illness:    Priscilla Davis is a 68 y.o. female with chronic systolic and diastolic heart failure, hypertension, diabetes, and CKD 4-5 presenting for follow-up appointment.  She was admitted 08/2018 with acute systolic and diastolic heart failure.  Her LVEF was 35 to 40% that admission.  She required diuresis with IV Lasix.  Troponin was elevated to 1.27.  Her troponin was thought to be elevated due to demand ischemia in the setting of heart failure and CKD.  She had an outpatient Lexiscan Myoview that revealed LVEF 44% with a nonreversible defect in the basal to mid inferior, inferolateral and anterolateral regions.  Findings were concerning for prior infarct but no ischemia.  She followed up with Kerin Ransom, PA-C on 08/2018, at which time she reported fatigue with exertion but was otherwise well.  She has been following with nephrology.  She did report orthostatic symptoms and hydralazine was decreased.  Priscilla Davis started on HD Tu/Th/Sa.    At her last appointment Priscilla Davis was doing well.  However given her elevated troponin and reduced systolic function she underwent left heart cath 12/03/2018 that revealed an occluded OM 2 and OM 3 that appeared chronic.  LAD was patent.  RCA was codominant with moderate, diffuse disease.  She had a high-grade lesion in the mid RCA and had successful stenting with a drug-eluting stent.  LVEF was 45 to 55% by left ventriculogram.  Titration of blood pressure medications was limited due to hypotension and dizziness.  She required Midodrin during hospitalization. After hospitalization she was seen in the ED 11/2018 with new onset of chest pain.  Chest CT was negative for pulmonary embolism or infiltrate.  Cardiac enzymes were negative so no further intervention occurred at that time.  Since then she has been better and denies any recurrent chest pain.  She still has shortness of breath with exertion but no orthopnea or PND.  She is able to get through her full HD sessions but sometimes they don't take much fluid off because she has been under her dry weight.  She hasn't been exercising but hopes to soon.   The patient does not have symptoms concerning for COVID-19 infection (fever, chills, cough, or new shortness of breath).    Past Medical History:  Diagnosis Date  . Acute combined systolic (congestive) and diastolic (congestive) heart failure ([place]) 11/2018  . Arthritis   . Asthma   . Depression   . Diabetes mellitus without complication ([place])    Type II  . GERD (gastroesophageal reflux  disease)   . Hypertension   . Renal disorder    CKD 5   Past Surgical History:  Procedure Laterality Date  . AV FISTULA PLACEMENT Left 06/19/2018   Procedure: INSERTION OF 4-7MM X 45CM ARTERIOVENOUS (AV) GORE-TEX GRAFT LEFT  ARM;  Surgeon: Elam Dutch, MD;  Location: [facility];  Service: Vascular;  Laterality: Left;  . CESAREAN SECTION    . CHOLECYSTECTOMY    . COLONOSCOPY    . CORONARY STENT INTERVENTION N/A  12/03/2018   Procedure: CORONARY STENT INTERVENTION;  Surgeon: Lorretta Harp, MD;  Location: [facility] CV [facility];  Service: Cardiovascular;  Laterality: N/A;  . EYE SURGERY Bilateral    diabetic   . LEFT HEART CATH AND CORONARY ANGIOGRAPHY N/A 12/03/2018   Procedure: LEFT HEART CATH AND CORONARY ANGIOGRAPHY;  Surgeon: Lorretta Harp, MD;  Location: [facility];  Service: Cardiovascular;  Laterality: N/A;  . toe amputation Right    5th toe     Current Meds  Medication Sig  . acetaminophen (TYLENOL) 500 MG tablet Take 1,000 mg by mouth every 6 (six) hours as needed for fever or pain.   Marland Kitchen albuterol (PROVENTIL HFA;VENTOLIN HFA) 108 (90 Base) MCG/ACT inhaler Inhale 2 puffs into the lungs every 6 (six) hours as needed for wheezing or shortness of breath.  Marland Kitchen aspirin EC 81 MG tablet Take 81 mg by mouth daily.  . Cholecalciferol (VITAMIN D) 125 MCG (5000 UT) CAPS Take 5,000 Units by mouth daily.   . dorzolamide (TRUSOPT) 2 % ophthalmic solution Place 1 drop into both eyes 2 (two) times daily.  Marland Kitchen FLUoxetine (PROZAC) 20 MG capsule Take 20 mg by mouth daily.  Marland Kitchen gabapentin (NEURONTIN) 100 MG capsule Take 1 capsule (100 mg total) by mouth daily.  . insulin detemir (LEVEMIR) 100 UNIT/ML injection Inject 0.15 mLs (15 Units total) into the skin daily. (Patient taking differently: Inject 15 Units into the skin at bedtime. )  . latanoprost (XALATAN) 0.005 % ophthalmic solution Place 1 drop into both eyes at bedtime.  . lidocaine-prilocaine (EMLA) cream Apply 1 application topically Every Tuesday,Thursday,and Saturday with dialysis.  Marland Kitchen liraglutide (VICTOZA) 18 MG/3ML SOPN Inject 1.8 mg into the skin every evening.   . midodrine (PROAMATINE) 5 MG tablet Take 5 mg by mouth as directed. 1 tablet twice a day except 10 mg twice a day on dialysis days  . Multiple Vitamin (MULTIVITAMIN WITH MINERALS) TABS tablet Take 1 tablet by mouth daily. woman's  . omeprazole (PRILOSEC) 20 MG capsule Take 20 mg by mouth  daily.   . rosuvastatin (CRESTOR) 40 MG tablet Take 40 mg by mouth at bedtime.   . ticagrelor (BRILINTA) 90 MG TABS tablet Take 1 tablet (90 mg total) by mouth 2 (two) times daily.  . vitamin B-12 (CYANOCOBALAMIN) 1000 MCG tablet Take 1,000 mcg by mouth daily.     Allergies:   Patient has no known allergies.   Social History   Tobacco Use  . Smoking status: Former Smoker    Packs/day: 0.25    Years: 30.00    Pack years: 7.50    Types: Cigarettes    Last attempt to quit: 09/17/2018    Years since quitting: 0.3  . Smokeless tobacco: Never Used  Substance Use Topics  . Alcohol use: Yes    Comment: seldom  . Drug use: Never     Family Hx: The patient's family history includes Hypertension in her mother; Peripheral vascular disease in her mother.  ROS:  Please see the history of present illness.     All other systems reviewed and are negative.   Prior CV studies:   The following studies were reviewed today:  CARDIAC CATH: 12/03/2018   Ost 2nd Mrg to 2nd Mrg lesion is 100% stenosed.  Ost 3rd Mrg lesion is 100% stenosed.  Prox RCA to Mid RCA lesion is 40% stenosed.  Dist RCA lesion is 95% stenosed.  A drug-eluting stent was successfully placed.  Post intervention, there is a 0% residual stenosis.  There is moderate left ventricular systolic dysfunction.  LV end diastolic pressure is mildly elevated.  The left ventricular ejection fraction is 45-50% by visual estimate.      Intervention     Labs/Other Tests and Data Reviewed:    EKG:  No ECG reviewed.  Recent Labs: 09/09/2018: Magnesium 2.0 12/01/2018: ALT 26; B Natriuretic Peptide 37.0 12/19/2018: BUN 15; Creatinine, Ser 3.30; Hemoglobin 9.1; Platelets 352; Potassium 3.3; Sodium 136; TSH 1.812   Recent Lipid Panel Lab Results  Component Value Date/Time   CHOL 102 09/10/2018 03:44 AM   TRIG 60 09/10/2018 03:44 AM   HDL 45 09/10/2018 03:44 AM   CHOLHDL 2.3 09/10/2018 03:44 AM   LDLCALC 45  09/10/2018 03:44 AM    Wt Readings from Last 3 Encounters:  01/09/19 193 lb (87.5 kg)  12/19/18 196 lb (88.9 kg)  12/12/18 200 lb (90.7 kg)      Objective:    Ht 5' 8.5" (1.74 m)   Wt 193 lb (87.5 kg)   BMI 28.92 kg/m  GENERAL: Well-appearing.  No acute distress. HEENT: Pupils equal round.  Oral mucosa unremarkable NECK:  No jugular venous distention, no visible thyromegaly EXT:  No edema, no cyanosis no clubbing SKIN:  No rashes no nodules NEURO:  Speech fluent.  Cranial nerves grossly intact.  Moves all 4 extremities freely PSYCH:  Cognitively intact, oriented to person place and time   ASSESSMENT & PLAN:    # CAD s/p PCI: # Hyperlipidemia: Successful distal RCA PCI 11/2018.   Continue DAPT for 1 year.  Use of beta blocker limited by hypotension.  Continue rosuvastatin.  # Hypotension: BP has been labile and low at times.  She is doing better on midodrine.     # Chronic systolic and diastlic heart failure: No volume overload at this time.  Volume managed by HD. LVEF improved to 40-45% by cath.  No beta blocker or ARB 2/2 hypotension.  # Shortness of breath: Priscilla Davis has a smoking history and reports exertional dyspnea despite her PCP and being euvolemic.  Will refer for PFTs.    COVID-19 Education: The signs and symptoms of COVID-19 were discussed with the patient and how to seek care for testing (follow up with PCP or arrange E-visit).  The importance of social distancing was discussed today.  Time:   Today, I have spent 17 minutes with the patient with telehealth technology discussing the above problems.     Medication Adjustments/Labs and Tests Ordered: Current medicines are reviewed at length with the patient today.  Concerns regarding medicines are outlined above.   Tests Ordered: No orders of the defined types were placed in this encounter.   Medication Changes: No orders of the defined types were placed in this encounter.   Disposition:  Follow up  in 4 month(s)  Signed, Skeet Latch, MD  01/15/2019 12:55 PM    [facility]

## 2019-01-09 NOTE — Patient Instructions (Addendum)
Medication Instructions:  Your physician recommends that you continue on your current medications as directed. Please refer to the Current Medication list given to you today.  If you need a refill on your cardiac medications before your next appointment, please call your pharmacy.   Lab work: NONE  Testing/Procedures: Get PULMONARY FUNCTION TEST with PCP as planned   Follow-Up: At [facility], you and your health needs are our priority.  As part of our continuing mission to provide you with exceptional heart care, we have created designated Provider Care Teams.  These Care Teams include your primary Cardiologist (physician) and Advanced Practice Providers (APPs -  Physician Assistants and Nurse Practitioners) who all work together to provide you with the care you need, when you need it. You will need a follow up appointment in 4 months.  Please call our office 2 months in advance to schedule this appointment.  You may see Skeet Latch, MD or one of the following Advanced Practice Providers on your designated Care Team:   Kerin Ransom, PA-C Roby Lofts, [place] . Sande Rives, PA-C

## 2019-01-10 ENCOUNTER — Telehealth: Payer: Self-pay | Admitting: *Deleted

## 2019-01-10 NOTE — Telephone Encounter (Signed)
Patient had video visit yesterday and was to follow up with Dr Oval Linsey in 4 months. Realized today that she had appointment later this month with PA. Left message to call back and will cancel visit

## 2019-01-14 ENCOUNTER — Telehealth (HOSPITAL_COMMUNITY): Payer: Self-pay | Admitting: *Deleted

## 2019-01-14 NOTE — Telephone Encounter (Signed)
Pt returned call.  Pt is active within the home and does some walking at times.  With her dialysis schedule and how she feels on the off days from dialysis  being more active is difficult for her.  Pt declines to participate in CR.  I will send her information on stretches and chair exercises she can do as tolerated. Cherre Huger, BSN Cardiac and Training and development officer

## 2019-01-14 NOTE — Telephone Encounter (Signed)
Called and left message for pt regarding interest in participating in CR along with Dialysis.  Requested call back. Cherre Huger, BSN Cardiac and Training and development officer

## 2019-01-16 NOTE — Telephone Encounter (Signed)
Spoke with patient and cancelled appointment next week

## 2019-01-23 ENCOUNTER — Telehealth: Payer: Medicare Other | Admitting: Cardiology

## 2019-07-08 ENCOUNTER — Telehealth: Payer: Self-pay | Admitting: *Deleted

## 2019-07-08 NOTE — Telephone Encounter (Signed)
A message was left, re: her follow up visit.
